# Patient Record
Sex: Female | Born: 1955
Health system: Southern US, Community
[De-identification: ages and names within clinical notes are randomized; demographics above are authoritative.]

## PROBLEM LIST (undated history)

## (undated) DIAGNOSIS — I4819 Other persistent atrial fibrillation: Secondary | ICD-10-CM

## (undated) DIAGNOSIS — E079 Disorder of thyroid, unspecified: Secondary | ICD-10-CM

## (undated) DIAGNOSIS — I872 Venous insufficiency (chronic) (peripheral): Secondary | ICD-10-CM

## (undated) DIAGNOSIS — M1990 Unspecified osteoarthritis, unspecified site: Secondary | ICD-10-CM

## (undated) DIAGNOSIS — I10 Essential (primary) hypertension: Secondary | ICD-10-CM

## (undated) DIAGNOSIS — E782 Mixed hyperlipidemia: Secondary | ICD-10-CM

## (undated) DIAGNOSIS — U071 COVID-19: Secondary | ICD-10-CM

## (undated) DIAGNOSIS — E039 Hypothyroidism, unspecified: Secondary | ICD-10-CM

## (undated) HISTORY — DX: Other persistent atrial fibrillation: I48.19

## (undated) HISTORY — DX: COVID-19: U07.1

## (undated) HISTORY — PX: CARPAL TUNNEL RELEASE: SHX101

## (undated) HISTORY — PX: HERNIA REPAIR: SHX51

## (undated) HISTORY — DX: Unspecified osteoarthritis, unspecified site: M19.90

## (undated) HISTORY — DX: Essential (primary) hypertension: I10

## (undated) HISTORY — DX: Venous insufficiency (chronic) (peripheral): I87.2

## (undated) HISTORY — DX: Mixed hyperlipidemia: E78.2

## (undated) HISTORY — DX: Hypothyroidism, unspecified: E03.9

---

## 2000-08-24 ENCOUNTER — Inpatient Hospital Stay (HOSPITAL_COMMUNITY): Admission: EM | Admit: 2000-08-24 | Discharge: 2000-08-29 | Payer: Self-pay | Admitting: Emergency Medicine

## 2000-08-24 ENCOUNTER — Encounter: Payer: Self-pay | Admitting: Emergency Medicine

## 2000-09-06 ENCOUNTER — Encounter: Payer: Self-pay | Admitting: Family Medicine

## 2000-09-06 ENCOUNTER — Ambulatory Visit (HOSPITAL_COMMUNITY): Admission: RE | Admit: 2000-09-06 | Discharge: 2000-09-06 | Payer: Self-pay | Admitting: Family Medicine

## 2003-04-16 ENCOUNTER — Emergency Department (HOSPITAL_COMMUNITY): Admission: EM | Admit: 2003-04-16 | Discharge: 2003-04-16 | Payer: Self-pay | Admitting: Emergency Medicine

## 2003-06-23 ENCOUNTER — Ambulatory Visit (HOSPITAL_COMMUNITY): Admission: RE | Admit: 2003-06-23 | Discharge: 2003-06-23 | Payer: Self-pay | Admitting: Podiatry

## 2003-12-01 ENCOUNTER — Observation Stay (HOSPITAL_COMMUNITY): Admission: RE | Admit: 2003-12-01 | Discharge: 2003-12-02 | Payer: Self-pay | Admitting: General Surgery

## 2004-01-08 ENCOUNTER — Ambulatory Visit (HOSPITAL_COMMUNITY): Admission: RE | Admit: 2004-01-08 | Discharge: 2004-01-08 | Payer: Self-pay | Admitting: Family Medicine

## 2004-04-02 ENCOUNTER — Ambulatory Visit (HOSPITAL_COMMUNITY): Admission: RE | Admit: 2004-04-02 | Discharge: 2004-04-02 | Payer: Self-pay | Admitting: Podiatry

## 2004-08-30 ENCOUNTER — Ambulatory Visit (HOSPITAL_COMMUNITY): Admission: RE | Admit: 2004-08-30 | Discharge: 2004-08-30 | Payer: Self-pay | Admitting: Family Medicine

## 2005-08-24 ENCOUNTER — Ambulatory Visit (HOSPITAL_COMMUNITY): Admission: RE | Admit: 2005-08-24 | Discharge: 2005-08-24 | Payer: Self-pay | Admitting: *Deleted

## 2005-12-30 ENCOUNTER — Ambulatory Visit (HOSPITAL_COMMUNITY): Admission: RE | Admit: 2005-12-30 | Discharge: 2005-12-30 | Payer: Self-pay | Admitting: Internal Medicine

## 2007-07-25 ENCOUNTER — Ambulatory Visit (HOSPITAL_COMMUNITY): Admission: RE | Admit: 2007-07-25 | Discharge: 2007-07-25 | Payer: Self-pay | Admitting: Family Medicine

## 2007-08-06 ENCOUNTER — Ambulatory Visit (HOSPITAL_COMMUNITY): Admission: RE | Admit: 2007-08-06 | Discharge: 2007-08-06 | Payer: Self-pay | Admitting: Family Medicine

## 2008-07-01 IMAGING — US US BREAST*L*
1 series · 8 of 8 positions shown · non-contrast
Comparison: none

LEFT DIAGNOSTIC MAMMOGRAM

LEFT BREAST ULTRASOUND
UNILATERAL LEFT DIAGNOSTIC MAMMOGRAM AND LEFT BREAST ULTRASOUND:
CLINICAL DATA: Abnormal baseline screening mammogram.

[Series 1: unknown · 0.07mm/px · 8 of 8 slices shown]
[im 1/8]
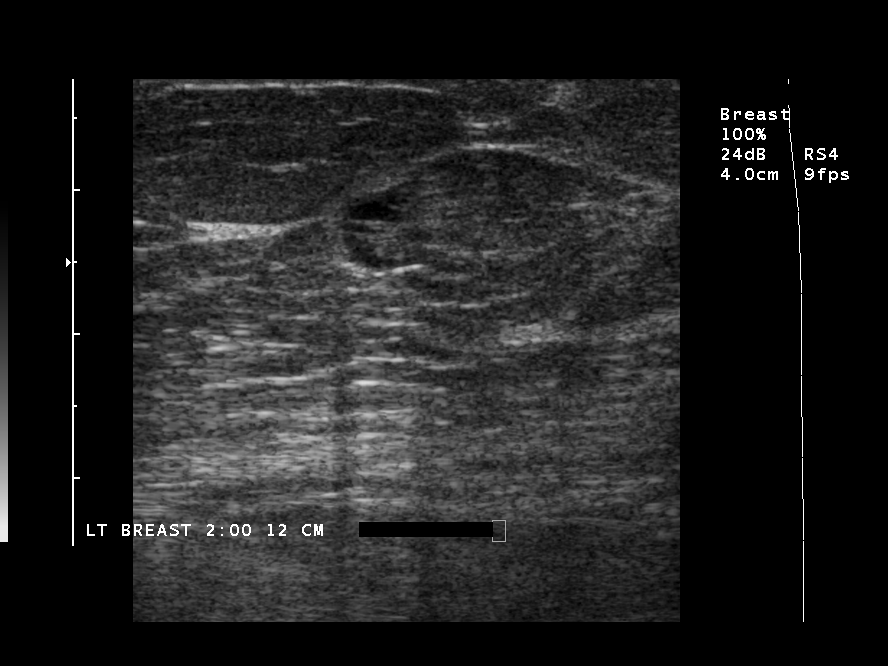
[im 2/8]
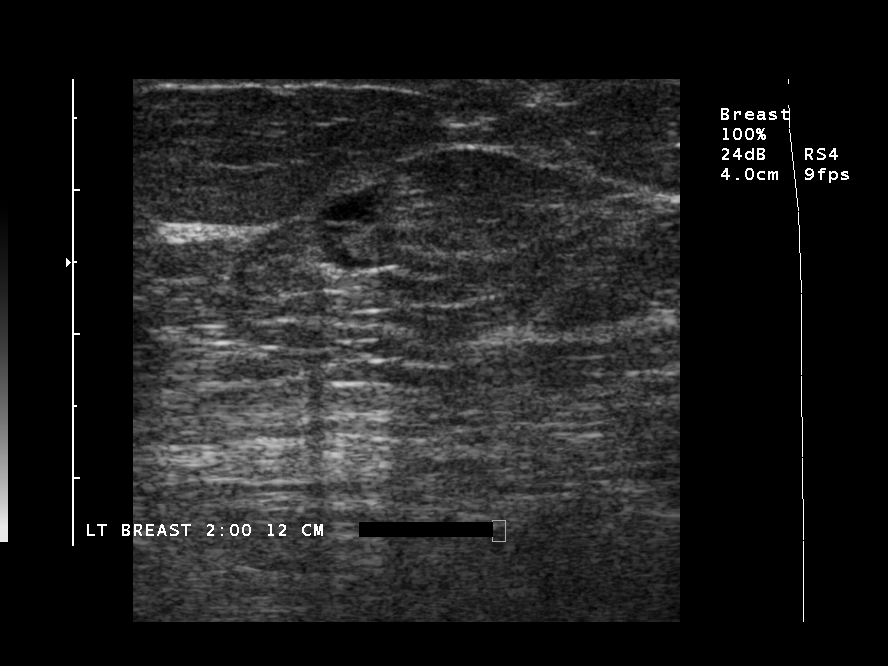
[im 3/8]
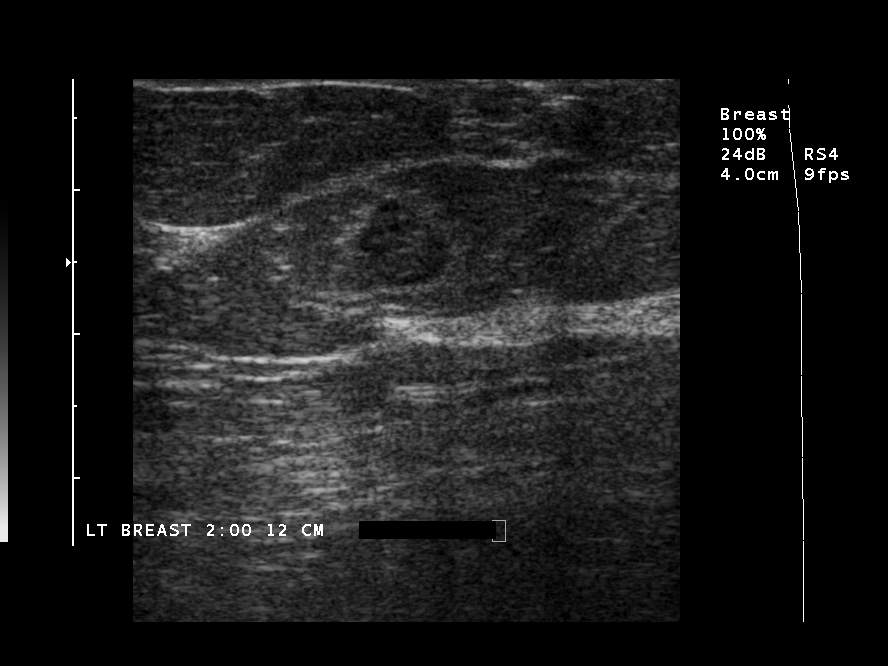
[im 4/8]
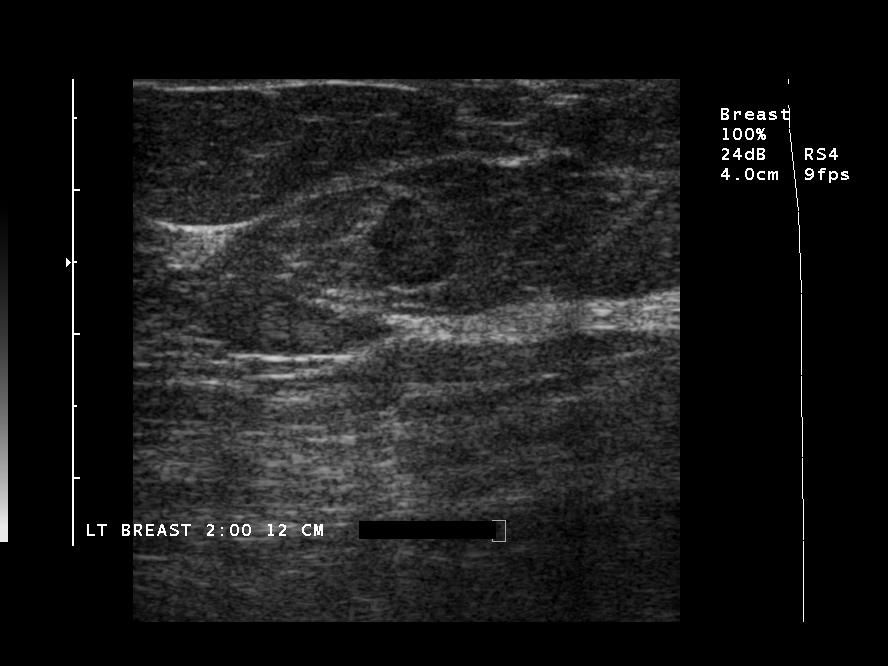
[im 5/8]
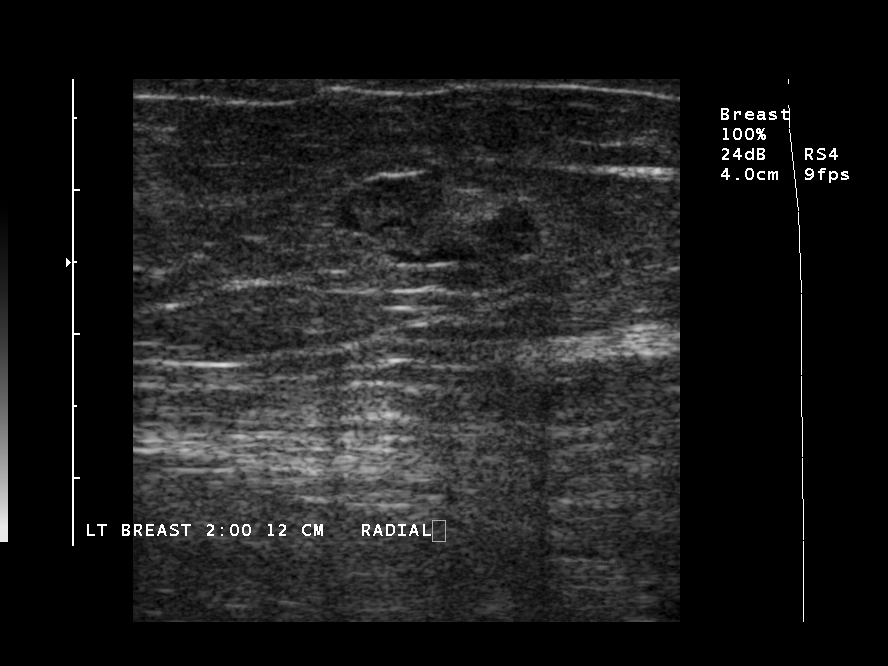
[im 6/8]
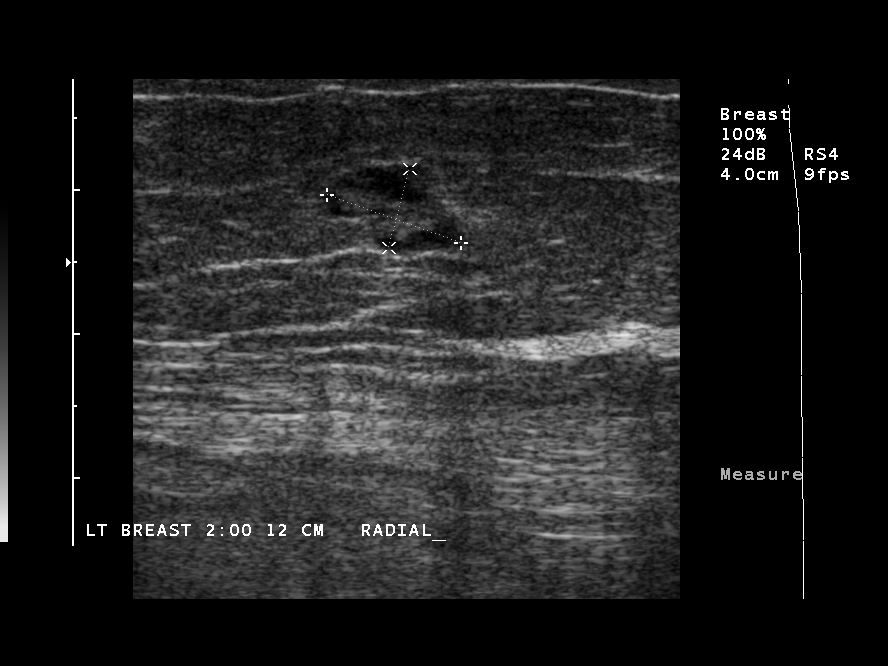
[im 7/8]
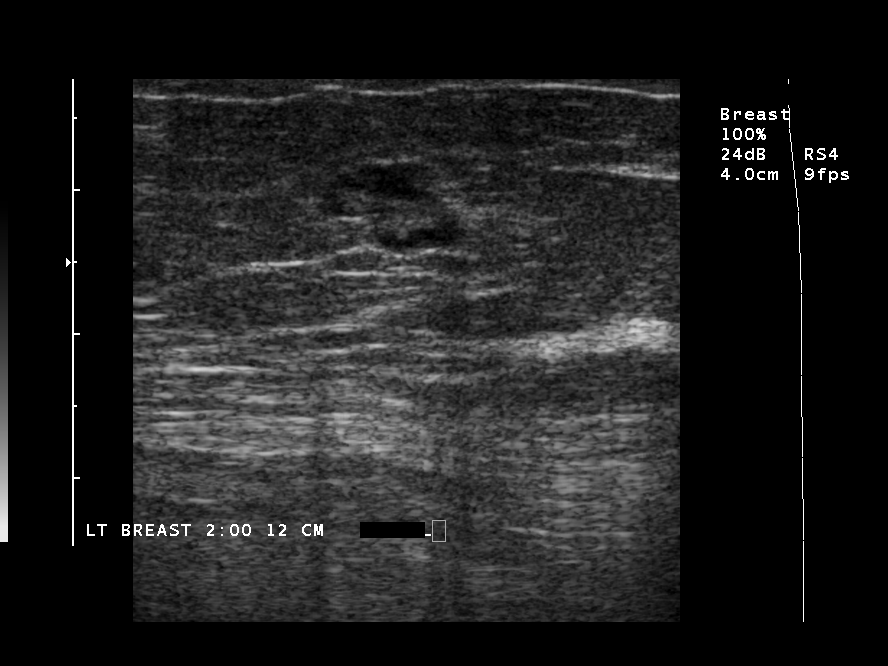
[im 8/8]
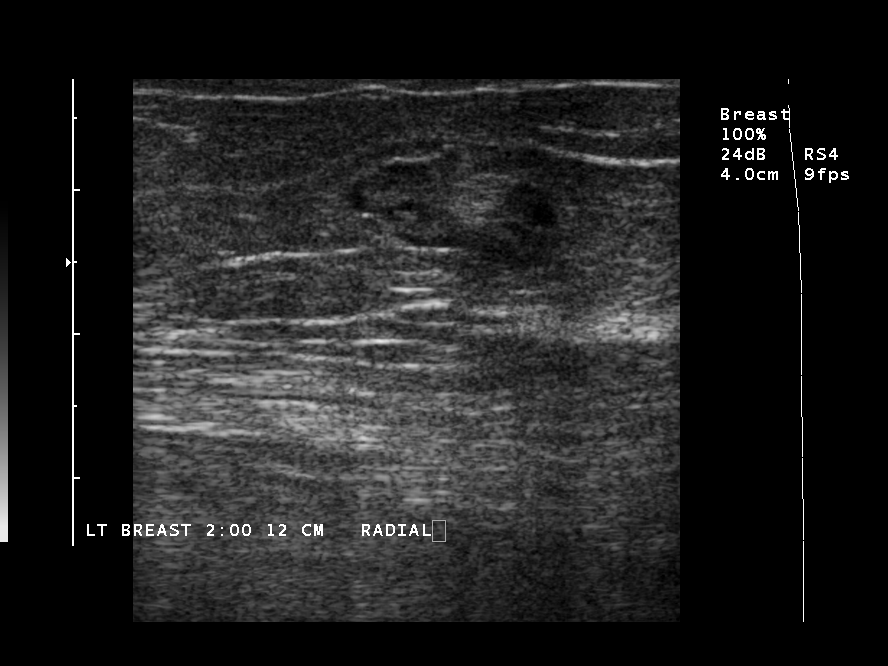

[8 of 8 positions shown; findings below may reference images not displayed]

Focally compressed left CC and left MLO views were obtained which demonstrate circumscribed nodules
adjacent to one another within the left breast at the 2 o'clock position. The appearance on 
mammography would be most consistent with intramammary lymph nodes.

On my directed physical examination of this area there is no discrete palpable abnormality.  
Ultrasound demonstrates multiple benign-appearing intramammary lymph nodes located at the 2 o'clock
position within the left breast approximately 15 cm from the nipple.  These do contain central 
fatty hilar regions with normal cortex.  There are no other lesions.
IMPRESSION: Findings are consistent with multiple benign intramammary lymph nodes adjacent one another within 
the upper outer quadrant of the left breast at the 2 o'clock position.  Recommend annual screening 
mammography.

ASSESSMENT: Benign - BI-RADS 2

Screening mammogram of both breasts in 1 year.
,

## 2010-07-23 NOTE — H&P (Signed)
NAME:  Angela [name], Angela [name]                        ACCOUNT NO.:  [id number]   MEDICAL RECORD NO.:  [id number]                   PATIENT TYPE:   LOCATION:                                       FACILITY:   PHYSICIAN:  Dalia Heading, M.D.               DATE OF BIRTH:  05-03-1955   DATE OF ADMISSION:  DATE OF DISCHARGE:                                HISTORY & PHYSICAL   CHIEF COMPLAINT:  Umbilical hernia.   HISTORY OF PRESENT ILLNESS:  The patient is a 55 year old morbidly obese  white female who is referred for evaluation and treatment of an umbilical  hernia. She has had increasing pain and swelling in the umbilical region  recently. No nausea or vomiting have been noted.   PAST MEDICAL HISTORY:  1.  Diverticulitis.  2.  Morbid obesity.   PAST SURGICAL HISTORY:  Cesarean section.   CURRENT MEDICATIONS:  Fluconazole, hydrocodone p.r.n. pain.   ALLERGIES:  No known drug allergies.   REVIEW OF SYSTEMS:  Noncontributory.   PHYSICAL EXAMINATION:  GENERAL:  The patient is a morbidly obese white  female in no acute distress. She weighs 420 pounds.  VITAL SIGNS:  Stable.  LUNGS:  Clear to auscultation with equal breath sounds bilaterally.  HEART:  Examination reveals regular rate and rhythm without S3, S4, or  murmurs.  ABDOMEN:  Soft with tenderness down the umbilical region with a large hernia  that is present and difficult to reduce. It is greater than 6 cm in size. No  hepatosplenomegaly or masses are noted.   IMPRESSION:  Umbilical hernia.   PLAN:  The patient is scheduled for an umbilical herniorrhaphy with mesh on  December 01, 2003. The risks and benefits of the procedure including  bleeding, infection, cardiopulmonary difficulties, and the possibility of  the recurrence of the hernia were fully explained to the patient who gave  informed consent.     ___________________________________________                                         Dalia Heading, M.D.   MAJ/MEDQ   D:  11/20/2003  T:  11/20/2003  Job:  [id number]   cc:   Corrie Mckusick, M.D.  [address] [place]ure[place] Josephs. [place]  [place]  [place] [address]  Fax: [phone]

## 2010-07-23 NOTE — Procedures (Signed)
Angela [name], Angela [name] NO.:  [id number]   MEDICAL RECORD NO.:  [id number]          PATIENT TYPE:  OUT   LOCATION:  RAD                           FACILITY:  [facility]   PHYSICIAN:  Vida Roller, M.D.   DATE OF BIRTH:  1955-11-05   DATE OF PROCEDURE:  08/30/2004  DATE OF DISCHARGE:                                  ECHOCARDIOGRAM   PROCEDURE:  Echocardiogram - Tape #[id number].  Tape count 800 through 1317.   CARDIOLOGIST:  Vida Roller, M.D.   INDICATIONS FOR PROCEDURE:  This is a 55 year old woman with lower extremity  edema.  The technical quality of the study is extremely difficult, with only  very limited views, some of which are non-standard.   RESULTS:  The M-mode tracings  Aorta:  36 mm.  The left atrium:  43 mm.  Septum:  14 mm.  Posterior wall:  13 mm.  Left ventricular diastolic dimension:  51 mm.  Left ventricular systolic dimension:  39 mm.   2-D AND DOPPLER IMAGING:  The left ventricle appears to be normal size, with  mild concentric left ventricular hypertrophy.  The estimated ejection  fraction is somewhere between 50%-60%.  The endocardial definition is quite  limited.  There does, however, not appear to be significant wall motion  abnormality seen.   The RV was not well seen.   The atria appear to be top normal in size and may be enlarged.   There is no significant valvular heart disease by Doppler.   There is a small pericardial effusion, which does not appear to be  hemodynamically significant.       JH/MEDQ  D:  08/30/2004  T:  08/30/2004  Job:  [id number]   cc:   Corrie Mckusick, M.D.  Fax: [phone]

## 2010-07-23 NOTE — Op Note (Signed)
Angela [name], Angela [name]              ACCOUNT NO.:  [id number]   MEDICAL RECORD NO.:  [id number]          PATIENT TYPE:  AMB   LOCATION:  SDS                          FACILITY:  [facility]   PHYSICIAN:  Lowell Bouton, M.D.DATE OF BIRTH:  1955-11-05   DATE OF PROCEDURE:  08/24/2005  DATE OF DISCHARGE:                                 OPERATIVE REPORT   PREOP DIAGNOSIS:  Right carpal tunnel syndrome.   POSTOP DIAGNOSIS:  Right carpal tunnel syndrome.   PROCEDURE:  Decompression median nerve right carpal tunnel.   SURGEON:  Lowell Bouton, M.D.   ANESTHESIA:  1/2% Marcaine local with sedation.   OPERATIVE FINDINGS:  The patient had no masses in the carpal canal.  The  motor branch of the nerve was intact.   DESCRIPTION OF PROCEDURE:  Under 1/2% Marcaine local anesthesia, with a  tourniquet on the right arm; the right hand was prepped and draped in the  usual fashion, and after exsanguinating the limb the tourniquet was inflated  to 275 mmHg. Unfortunately due to the size of her arm, the tourniquet was  not of any benefit so it was released.  A 3-cm longitudinal incision was  then made in the palm, just ulnar to the thenar crease and carried through  the subcutaneous tissues.  Bleeding points were coagulated.  Blunt  dissection was carried through the superficial palmar fascia, distal to the  transverse carpal ligament.  A hemostat was then placed in the carpal canal,  up against the hook of the hamate and the transverse carpal ligament was  divided on the ulnar border of the median nerve.  The proximal end of the  ligament was divided with the scissors, after dissecting the nerve away from  the undersurface of the ligament.  The carpal canal was then palpated and  was found to be adequately decompressed.  The nerve was examined and the  motor branch was identified.  The wound was then irrigated with saline.  The  skin was closed with 4-0 nylon suture.  Sterile dressings  were applied,  followed by volar wrist splint.  The patient tolerated the procedure well,  and went to the recovery room, awake and stable in good condition.      Lowell Bouton, M.D.  Electronically Signed     EMM/MEDQ  D:  08/24/2005  T:  08/24/2005  Job:  [id number]   cc:   Corrie Mckusick, M.D.  Fax: [phone]

## 2010-07-23 NOTE — Op Note (Signed)
[name], Angela              ACCOUNT NO.:  [id number]   MEDICAL RECORD NO.:  [id number]          PATIENT TYPE:  AMB   LOCATION:  DAY                           FACILITY:  [facility]   PHYSICIAN:  Dalia Heading, M.D.  DATE OF BIRTH:  April 25, 1955   DATE OF PROCEDURE:  12/01/2003  DATE OF DISCHARGE:                                 OPERATIVE REPORT   PREOPERATIVE DIAGNOSIS:  Ventral hernia.   POSTOPERATIVE DIAGNOSIS:  Incarcerated ventral hernia.   PROCEDURE:  Incarcerated ventral herniorrhaphy with mesh, partial  omentectomy.   SURGEON:  Dalia Heading, M.D.   ANESTHESIA:  General endotracheal.   INDICATIONS:  The patient is a 55 year old morbidly obese white female who  presents with worsening swelling in the umbilical region. On examination.  She does have a ventral hernia. The contents could not be full reduced,  though the patient states that her symptoms of abdominal pain and swelling  seem to be worsening. The risks and benefits of the procedure including  bleeding, infection, cardiopulmonary difficulties, and the possibility of  recurrence of the hernia were fully explained to the patient who gave  informed consent.   PROCEDURE NOTE:  The patient was placed in the supine position. After  induction of general endotracheal, the abdomen was prepped and draped using  the usual sterile technique with Betadine. Surgical site confirmation was  performed.   A supraumbilical transverse incision was made. A large hernia sac was found,  though the actual defect in the base of the fascia was only 4 cm in size.  The hernia sac was entered into, and transverse colon was noted to be within  the hernia sac. A partial omentectomy was performed using LDS stapler to  facilitate reduction of the hernia. The transverse colon was then returned  into the abdominal cavity without difficulty after the omentectomy was  performed. The fascia was then closed transversely using a looped 0 Novofil  running suture. An [place] polypropylene mesh patch was then placed over the  fascia and secured with 0 Novofil interrupted sutures. The base of the  umbilicus was secured back to the fascia and mesh using the 2-0 Vicryl  interrupted suture. The subcutaneous layer was reapproximated using a 2-0  Vicryl interrupted suture. The skin was closed using staples. Sensorcaine  0.5% was instilled into the surrounding wound. Betadine ointment and dry  sterile dressing were applied.   All tape and needle counts were correct at the end of the procedure. The  patient was extubated in the operating room and went back to recovery room  awake in stable condition.   COMPLICATIONS:  None.   SPECIMENS:  Omentum, ventral hernia sac.   BLOOD LOSS:  Less than 100 cc.      MAJ/MEDQ  D:  12/01/2003  T:  12/01/2003  Job:  [id number]   cc:   Corrie Mckusick, M.D.  [address] [place]s. [place]  [place]  [place] [address]  Fax: [phone]

## 2011-01-18 ENCOUNTER — Ambulatory Visit: Payer: Self-pay | Admitting: Orthopedic Surgery

## 2011-03-10 ENCOUNTER — Ambulatory Visit: Payer: Self-pay | Admitting: Orthopedic Surgery

## 2012-10-12 ENCOUNTER — Other Ambulatory Visit (HOSPITAL_COMMUNITY): Payer: Self-pay | Admitting: Family Medicine

## 2012-10-12 DIAGNOSIS — Z139 Encounter for screening, unspecified: Secondary | ICD-10-CM

## 2012-10-16 ENCOUNTER — Inpatient Hospital Stay (HOSPITAL_COMMUNITY): Admission: RE | Admit: 2012-10-16 | Payer: Self-pay | Source: Ambulatory Visit

## 2013-03-11 ENCOUNTER — Ambulatory Visit (HOSPITAL_COMMUNITY): Payer: Self-pay

## 2013-07-05 ENCOUNTER — Emergency Department (HOSPITAL_COMMUNITY)
Admission: EM | Admit: 2013-07-05 | Discharge: 2013-07-05 | Disposition: A | Discharge status: Home / Self Care | Payer: Medicare HMO | Attending: Emergency Medicine | Admitting: Emergency Medicine

## 2013-07-05 ENCOUNTER — Encounter (HOSPITAL_COMMUNITY): Payer: Self-pay | Admitting: Emergency Medicine

## 2013-07-05 DIAGNOSIS — X58XXXA Exposure to other specified factors, initial encounter: Secondary | ICD-10-CM | POA: Insufficient documentation

## 2013-07-05 DIAGNOSIS — T169XXA Foreign body in ear, unspecified ear, initial encounter: Secondary | ICD-10-CM | POA: Insufficient documentation

## 2013-07-05 DIAGNOSIS — Y929 Unspecified place or not applicable: Secondary | ICD-10-CM | POA: Insufficient documentation

## 2013-07-05 DIAGNOSIS — T162XXA Foreign body in left ear, initial encounter: Secondary | ICD-10-CM

## 2013-07-05 DIAGNOSIS — E079 Disorder of thyroid, unspecified: Secondary | ICD-10-CM | POA: Insufficient documentation

## 2013-07-05 DIAGNOSIS — Y9389 Activity, other specified: Secondary | ICD-10-CM | POA: Insufficient documentation

## 2013-07-05 DIAGNOSIS — E669 Obesity, unspecified: Secondary | ICD-10-CM | POA: Insufficient documentation

## 2013-07-05 DIAGNOSIS — Z79899 Other long term (current) drug therapy: Secondary | ICD-10-CM | POA: Insufficient documentation

## 2013-07-05 HISTORY — DX: Disorder of thyroid, unspecified: E07.9

## 2013-07-05 MED ORDER — NEOMYCIN-POLYMYXIN-HC 1 % OT SOLN
3.0000 [drp] | Freq: Once | OTIC | Status: AC
  Administered 2013-07-05: 3 [drp] via OTIC
  Filled 2013-07-05: qty 10

## 2013-07-05 NOTE — Discharge Instructions (Signed)
Please do not use Q-tips in ears. Please apply 3 Cortisporin otic drops to the left ear 3 times daily for the next 5 days. Please see your primary doctor, or return to the emergency department if any signs of infection. Ear Foreign Body An ear foreign body is an object that is stuck in the ear. It is common for young children to put objects into the ear canal. These may include pebbles, beads, beans, and any other small objects which will fit. In adults, objects such as cotton swabs may become lodged in the ear canal. In all ages, the most common foreign bodies are insects that enter the ear canal.  SYMPTOMS  Foreign bodies may cause pain, buzzing or roaring sounds, hearing loss, and ear drainage.  HOME CARE INSTRUCTIONS   Keep all follow-up appointments with your caregiver as told.  Keep small objects out of reach of young children. Tell them not to put anything in their ears. SEEK IMMEDIATE MEDICAL CARE IF:   You have bleeding from the ear.  You have increased pain or swelling of the ear.  You have reduced hearing.  You have discharge coming from the ear.  You have a fever.  You have a headache. MAKE SURE YOU:   Understand these instructions.  Will watch your condition.  Will get help right away if you are not doing well or get worse. Document Released: 02/19/2000 Document Revised: 05/16/2011 Document Reviewed: 10/10/2007 [facility] Patient Information 2014 [employer].

## 2013-07-05 NOTE — ED Notes (Signed)
Pt states cotton of q tip fell off into left ear today. Denies pain.

## 2013-07-05 NOTE — ED Provider Notes (Signed)
CSN: [id number]     Arrival date & time 07/05/13  1216 History   First MD Initiated Contact with Patient 07/05/13 1243     Chief Complaint  Patient presents with  . Otalgia     (Consider location/radiation/quality/duration/timing/severity/associated sxs/prior Treatment) Patient is a 58 y.o. female presenting with ear pain. The history is provided by the patient.  Otalgia Location:  Left Behind ear:  No abnormality Quality:  Aching and pressure Severity:  Mild Onset quality:  Sudden Duration:  3 hours Timing:  Constant Progression:  Unchanged Context comment:  Pt got a q-tip stuck in the left ear. Relieved by:  Nothing Worsened by:  Nothing tried Ineffective treatments:  None tried Associated symptoms: no abdominal pain, no cough, no fever, no headaches, no hearing loss and no neck pain   Risk factors: no recent travel     Past Medical History  Diagnosis Date  . Thyroid disease    Past Surgical History  Procedure Laterality Date  . Cesarean section    . Hernia repair     History reviewed. No pertinent family history. History  Substance Use Topics  . Smoking status: Never Smoker   . Smokeless tobacco: Not on file  . Alcohol Use: No   OB History   Grav Para Term Preterm Abortions TAB SAB Ect Mult Living                 Review of Systems  Constitutional: Negative for fever and activity change.       All ROS Neg except as noted in HPI  HENT: Positive for ear pain. Negative for hearing loss and nosebleeds.   Eyes: Negative for photophobia and discharge.  Respiratory: Negative for cough, shortness of breath and wheezing.   Cardiovascular: Negative for chest pain and palpitations.  Gastrointestinal: Negative for abdominal pain and blood in stool.  Genitourinary: Negative for dysuria, frequency and hematuria.  Musculoskeletal: Negative for arthralgias, back pain and neck pain.  Skin: Negative.   Neurological: Negative for dizziness, seizures, speech difficulty and  headaches.  Psychiatric/Behavioral: Negative for hallucinations and confusion.      Allergies  Review of patient's allergies indicates no known allergies.  Home Medications   Prior to Admission medications   Medication Sig Start Date End Date Taking? Authorizing Provider  acetaminophen (TYLENOL) 500 MG tablet Take 1,000 mg by mouth every 6 (six) hours as needed for headache.   Yes Historical Provider, MD  levothyroxine (SYNTHROID, LEVOTHROID) 75 MCG tablet Take 1 tablet by mouth daily. 06/11/13  Yes Historical Provider, MD   BP 185/90  Pulse 88  Temp(Src) 97.5 F (36.4 C) (Oral)  Resp 18  SpO2 95% Physical Exam  Nursing note and vitals reviewed. Constitutional: She is oriented to person, place, and time. She appears well-developed and well-nourished.  Non-toxic appearance.  Obese   HENT:  Head: Normocephalic.  Right Ear: Tympanic membrane and external ear normal. No foreign bodies. No mastoid tenderness.  Left Ear: Tympanic membrane and external ear normal. A foreign body is present. No mastoid tenderness.  Mouth/Throat: Uvula is midline, oropharynx is clear and moist and mucous membranes are normal.  FB noted in the left EAC. Right EAC clear. TM wnl.  Eyes: EOM and lids are normal. Pupils are equal, round, and reactive to light.  Neck: Normal range of motion. Neck supple. Carotid bruit is not present.  Cardiovascular: Normal rate, regular rhythm, normal heart sounds, intact distal pulses and normal pulses.   Pulmonary/Chest: Breath sounds normal. No  respiratory distress.  Abdominal: Soft. Bowel sounds are normal. There is no tenderness. There is no guarding.  Musculoskeletal: Normal range of motion.  Lymphadenopathy:       Head (right side): No submandibular adenopathy present.       Head (left side): No submandibular adenopathy present.    She has no cervical adenopathy.  Neurological: She is alert and oriented to person, place, and time. She has normal strength. No cranial  nerve deficit or sensory deficit.  Skin: Skin is warm and dry.  Psychiatric: She has a normal mood and affect. Her speech is normal.    ED Course  FOREIGN BODY REMOVAL Date/Time: 07/05/2013 1:01 PM Performed by: Lenox Ahr Authorized by: Lenox Ahr Consent: Verbal consent obtained. Risks and benefits: risks, benefits and alternatives were discussed Consent given by: patient Patient understanding: patient states understanding of the procedure being performed Patient identity confirmed: arm band Time out: Immediately prior to procedure a "time out" was called to verify the correct patient, procedure, equipment, support staff and site/side marked as required. Body area: ear Location details: left ear Patient sedated: no Patient restrained: no Patient cooperative: yes Localization method: ENT speculum Removal mechanism: alligator forceps Complexity: simple 1 objects recovered. Objects recovered: Q-Tip Post-procedure assessment: foreign body removed Patient tolerance: Patient tolerated the procedure well with no immediate complications.   (including critical care time) Labs Review Labs Reviewed - No data to display  Imaging Review No results found.   EKG Interpretation None      MDM Patient was attempting to clean her ears with a Q-tip, when she had her Q-tip stuck in the left year. Q-tip removed without problem .mild scratchy areas noted after the Q-tip was removed. Patient treated with Cortisporin otic drops. Patient will return if any changes or problems.    Final diagnoses:  None    **I have reviewed nursing notes, vital signs, and all appropriate lab and imaging results for this patient.Lenox Ahr, PA-C 07/06/13 [phone]

## 2013-07-05 NOTE — ED Notes (Signed)
Pt has been using Q tip in lt ear, FB sensation,  Seen and eval by Sima Matas PA

## 2013-07-08 NOTE — ED Provider Notes (Signed)
Medical screening examination/treatment/procedure(s) were performed by non-physician practitioner and as supervising physician I was immediately available for consultation/collaboration.   EKG Interpretation None        Maudry Diego, MD 07/08/13 [phone]

## 2013-10-30 DIAGNOSIS — K439 Ventral hernia without obstruction or gangrene: Secondary | ICD-10-CM | POA: Insufficient documentation

## 2013-10-30 DIAGNOSIS — E65 Localized adiposity: Secondary | ICD-10-CM | POA: Insufficient documentation

## 2014-05-08 DIAGNOSIS — E782 Mixed hyperlipidemia: Secondary | ICD-10-CM | POA: Diagnosis not present

## 2014-05-08 DIAGNOSIS — E063 Autoimmune thyroiditis: Secondary | ICD-10-CM | POA: Diagnosis not present

## 2014-05-08 DIAGNOSIS — Z6841 Body Mass Index (BMI) 40.0 and over, adult: Secondary | ICD-10-CM | POA: Diagnosis not present

## 2014-05-08 DIAGNOSIS — D492 Neoplasm of unspecified behavior of bone, soft tissue, and skin: Secondary | ICD-10-CM | POA: Diagnosis not present

## 2014-07-08 DIAGNOSIS — Z6841 Body Mass Index (BMI) 40.0 and over, adult: Secondary | ICD-10-CM | POA: Diagnosis not present

## 2014-07-08 DIAGNOSIS — L989 Disorder of the skin and subcutaneous tissue, unspecified: Secondary | ICD-10-CM | POA: Diagnosis not present

## 2014-10-13 DIAGNOSIS — Z1389 Encounter for screening for other disorder: Secondary | ICD-10-CM | POA: Diagnosis not present

## 2014-10-13 DIAGNOSIS — Z6841 Body Mass Index (BMI) 40.0 and over, adult: Secondary | ICD-10-CM | POA: Diagnosis not present

## 2014-10-13 DIAGNOSIS — Z0000 Encounter for general adult medical examination without abnormal findings: Secondary | ICD-10-CM | POA: Diagnosis not present

## 2014-11-13 DIAGNOSIS — C44519 Basal cell carcinoma of skin of other part of trunk: Secondary | ICD-10-CM | POA: Diagnosis not present

## 2014-12-11 DIAGNOSIS — Z85828 Personal history of other malignant neoplasm of skin: Secondary | ICD-10-CM | POA: Diagnosis not present

## 2014-12-11 DIAGNOSIS — Z08 Encounter for follow-up examination after completed treatment for malignant neoplasm: Secondary | ICD-10-CM | POA: Diagnosis not present

## 2014-12-11 DIAGNOSIS — L929 Granulomatous disorder of the skin and subcutaneous tissue, unspecified: Secondary | ICD-10-CM | POA: Diagnosis not present

## 2015-04-27 DIAGNOSIS — Z0000 Encounter for general adult medical examination without abnormal findings: Secondary | ICD-10-CM | POA: Diagnosis not present

## 2015-04-27 DIAGNOSIS — Z1389 Encounter for screening for other disorder: Secondary | ICD-10-CM | POA: Diagnosis not present

## 2015-04-27 DIAGNOSIS — Z6841 Body Mass Index (BMI) 40.0 and over, adult: Secondary | ICD-10-CM | POA: Diagnosis not present

## 2015-04-27 DIAGNOSIS — E782 Mixed hyperlipidemia: Secondary | ICD-10-CM | POA: Diagnosis not present

## 2015-05-15 ENCOUNTER — Other Ambulatory Visit (HOSPITAL_COMMUNITY): Payer: Self-pay | Admitting: Family Medicine

## 2015-05-15 DIAGNOSIS — Z1231 Encounter for screening mammogram for malignant neoplasm of breast: Secondary | ICD-10-CM

## 2015-05-20 ENCOUNTER — Ambulatory Visit (HOSPITAL_COMMUNITY): Payer: Medicare HMO

## 2015-06-04 ENCOUNTER — Ambulatory Visit (HOSPITAL_COMMUNITY)
Admission: RE | Admit: 2015-06-04 | Discharge: 2015-06-04 | Disposition: A | Discharge status: Home / Self Care | Payer: Medicare HMO | Source: Ambulatory Visit | Attending: Family Medicine | Admitting: Family Medicine

## 2015-06-04 DIAGNOSIS — Z1231 Encounter for screening mammogram for malignant neoplasm of breast: Secondary | ICD-10-CM | POA: Insufficient documentation

## 2016-05-02 DIAGNOSIS — I4891 Unspecified atrial fibrillation: Secondary | ICD-10-CM | POA: Diagnosis not present

## 2016-05-02 DIAGNOSIS — G4730 Sleep apnea, unspecified: Secondary | ICD-10-CM | POA: Diagnosis not present

## 2016-05-02 DIAGNOSIS — Z23 Encounter for immunization: Secondary | ICD-10-CM | POA: Diagnosis not present

## 2016-05-02 DIAGNOSIS — Z0000 Encounter for general adult medical examination without abnormal findings: Secondary | ICD-10-CM | POA: Diagnosis not present

## 2016-05-02 DIAGNOSIS — E039 Hypothyroidism, unspecified: Secondary | ICD-10-CM | POA: Diagnosis not present

## 2016-05-02 DIAGNOSIS — R011 Cardiac murmur, unspecified: Secondary | ICD-10-CM | POA: Diagnosis not present

## 2016-05-02 DIAGNOSIS — E785 Hyperlipidemia, unspecified: Secondary | ICD-10-CM | POA: Diagnosis not present

## 2016-05-02 DIAGNOSIS — Z1389 Encounter for screening for other disorder: Secondary | ICD-10-CM | POA: Diagnosis not present

## 2016-05-02 DIAGNOSIS — Z6841 Body Mass Index (BMI) 40.0 and over, adult: Secondary | ICD-10-CM | POA: Diagnosis not present

## 2016-05-02 DIAGNOSIS — E063 Autoimmune thyroiditis: Secondary | ICD-10-CM | POA: Diagnosis not present

## 2016-06-06 DIAGNOSIS — E782 Mixed hyperlipidemia: Secondary | ICD-10-CM

## 2016-06-06 DIAGNOSIS — I4891 Unspecified atrial fibrillation: Secondary | ICD-10-CM | POA: Insufficient documentation

## 2016-06-06 DIAGNOSIS — E039 Hypothyroidism, unspecified: Secondary | ICD-10-CM | POA: Insufficient documentation

## 2016-06-06 DIAGNOSIS — E063 Autoimmune thyroiditis: Secondary | ICD-10-CM

## 2016-06-06 DIAGNOSIS — E785 Hyperlipidemia, unspecified: Secondary | ICD-10-CM | POA: Insufficient documentation

## 2016-06-06 DIAGNOSIS — R011 Cardiac murmur, unspecified: Secondary | ICD-10-CM | POA: Insufficient documentation

## 2016-06-06 DIAGNOSIS — G4730 Sleep apnea, unspecified: Secondary | ICD-10-CM | POA: Insufficient documentation

## 2016-06-08 ENCOUNTER — Encounter: Payer: Self-pay | Admitting: Cardiovascular Disease

## 2016-06-08 ENCOUNTER — Ambulatory Visit (INDEPENDENT_AMBULATORY_CARE_PROVIDER_SITE_OTHER): Payer: Medicare HMO | Admitting: Cardiovascular Disease

## 2016-06-08 VITALS — BP 146/66 | HR 72 | Ht 71.0 in | Wt >= 6400 oz

## 2016-06-08 DIAGNOSIS — R600 Localized edema: Secondary | ICD-10-CM

## 2016-06-08 DIAGNOSIS — R030 Elevated blood-pressure reading, without diagnosis of hypertension: Secondary | ICD-10-CM | POA: Diagnosis not present

## 2016-06-08 DIAGNOSIS — R011 Cardiac murmur, unspecified: Secondary | ICD-10-CM | POA: Diagnosis not present

## 2016-06-08 DIAGNOSIS — E063 Autoimmune thyroiditis: Secondary | ICD-10-CM | POA: Diagnosis not present

## 2016-06-08 DIAGNOSIS — Z7189 Other specified counseling: Secondary | ICD-10-CM | POA: Diagnosis not present

## 2016-06-08 DIAGNOSIS — I4891 Unspecified atrial fibrillation: Secondary | ICD-10-CM

## 2016-06-08 NOTE — Patient Instructions (Signed)
Medication Instructions:  STOP XARELTO   Labwork: NONE  Testing/Procedures: Your physician has requested that you have an echocardiogram. Echocardiography is a painless test that uses sound waves to create images of your heart. It provides your doctor with information about the size and shape of your heart and how well your heart's chambers and valves are working. This procedure takes approximately one hour. There are no restrictions for this procedure.   Follow-Up: Your physician recommends that you schedule a follow-up appointment in: 3 MONTHS    Any Other Special Instructions Will Be Listed Below (If Applicable).     If you need a refill on your cardiac medications before your next appointment, please call your pharmacy.

## 2016-06-08 NOTE — Progress Notes (Signed)
CARDIOLOGY CONSULT NOTE  Patient ID: Angela [name] MRN: [id number] DOB/AGE: 1955/08/26 61 y.o.  Admit date: (Not on file) Primary Physician: Purvis Kilts, MD Referring Physician: Hilma Favors  Reason for Consultation: atrial fibrillation, murmur  HPI: Angela [name] is a 61 y.o. female who is being seen today for the evaluation of atrial fibrillation and a murmur at the request of Sharilyn Sites, MD.  I personally reviewed office records. When she was recently evaluated by her PCP, she reportedly had an irregular rhythm and a murmur.  An ECG was reportedly performed at her PCPs office which I will order for personal review and interpretation.  She said "my heart felt funny and there were flutters ". This had been occurring intermittently for a month but became more frequent towards the end of February. She denies associated chest pain. She has chronic exertional dyspnea which she feels may have gotten slightly worse. She denies associated lightheadedness, dizziness, and syncope.  She was started on Xarelto about a month ago but says this is too expensive.  She has been having lower extremity edema which has gotten worse lately. Lasix 40 mg daily as needed has been ordered and she has been taking it daily for the past 2 months without significant relief.  Her blood pressure is elevated at 146/66 today in our office, but she says it is usually normal.  Labs 05/02/16: Hemoglobin 13.4, platelets 261, BUN 11, creatinine 1.1, total cholesterol 222, triglycerides 141, HDL 35, LDL 159.  ECG performed in the office today which I ordered and personally interpreted demonstrates normal sinus rhythm with a right bundle branch block.    No Known Allergies  Current Outpatient Prescriptions  Medication Sig Dispense Refill  . acetaminophen (TYLENOL) 500 MG tablet Take 1,000 mg by mouth every 6 (six) hours as needed for headache.    . furosemide (LASIX) 40 MG tablet Take 40 mg by  mouth daily as needed.    Marland Kitchen levothyroxine (SYNTHROID, LEVOTHROID) 100 MCG tablet Take 100 mcg by mouth daily before breakfast.    . rivaroxaban (XARELTO) 20 MG TABS tablet Take 20 mg by mouth daily with supper.     No current facility-administered medications for this visit.     Past Medical History:  Diagnosis Date  . Arthritis   . Thyroid disease     Past Surgical History:  Procedure Laterality Date  . CARPAL TUNNEL RELEASE     right hand  . CESAREAN SECTION    . HERNIA REPAIR      Social History   Social History  . Marital status: Married    Spouse name: N/A  . Number of children: N/A  . Years of education: N/A   Occupational History  . Not on file.   Social History Main Topics  . Smoking status: Never Smoker  . Smokeless tobacco: Never Used  . Alcohol use No  . Drug use: No  . Sexual activity: Not on file   Other Topics Concern  . Not on file   Social History Narrative  . No narrative on file     No family history of premature CAD in 1st degree relatives.  Prior to Admission medications   Medication Sig Start Date End Date Taking? Authorizing Provider  acetaminophen (TYLENOL) 500 MG tablet Take 1,000 mg by mouth every 6 (six) hours as needed for headache.   Yes Historical Provider, MD  furosemide (LASIX) 40 MG tablet Take 40 mg by mouth daily  as needed.   Yes Historical Provider, MD  levothyroxine (SYNTHROID, LEVOTHROID) 100 MCG tablet Take 100 mcg by mouth daily before breakfast.   Yes Historical Provider, MD  rivaroxaban (XARELTO) 20 MG TABS tablet Take 20 mg by mouth daily with supper.   Yes Historical Provider, MD     Review of systems complete and found to be negative unless listed above in HPI     Physical exam Blood pressure (!) 146/66, pulse 72, height 5\' 11"  (1.803 m), weight (!) 414 lb (187.8 kg), SpO2 97 %. General: NAD Neck: No JVD, no thyromegaly or thyroid nodule.  Lungs: Clear to auscultation bilaterally with normal respiratory  effort. CV: Nondisplaced PMI. Regular rate and rhythm, normal S1/S2, no S3/S4, no murmur.  1+ pitting bilateral lower extremity edema.  No carotid bruit.    Abdomen: Obese.  Skin: Pretibial erythema b/l.  Neurologic: Alert and oriented x 3.  Psych: Normal affect. Extremities: No clubbing or cyanosis.  HEENT: Normal.   ECG: Most recent ECG reviewed.  Telemetry: Independently reviewed.  Labs:  No results found for: WBC, HGB, HCT, MCV, PLT No results for input(s): NA, K, CL, CO2, BUN, CREATININE, CALCIUM, PROT, BILITOT, ALKPHOS, ALT, AST, GLUCOSE in the last 168 hours.  Invalid input(s): LABALBU No results found for: CKTOTAL, CKMB, CKMBINDEX, TROPONINI No results found for: CHOL No results found for: HDL No results found for: LDLCALC No results found for: TRIG No results found for: CHOLHDL No results found for: LDLDIRECT       Studies: No results found.  ASSESSMENT AND PLAN:  1. Arrhythmia and anticoagulation: I will order ECG from PCPs office for personal review and interpretation. If she indeed has atrial fibrillation, CHADSVASC score is only 1 (gender), thus anticoagulation would not be indicated. If blood pressure remains persistently elevated and she indeed has hypertension, then I would initiate anticoagulation. For the time being, I will stop Xarelto.  2. Elevated BP: She says systolic blood pressure is normally in the 120 range. I will continue to monitor this.  3. Murmur: While I do not appreciate a significant murmur, PCP heard this. I will order a 2-D echocardiogram with Doppler to evaluate cardiac structure, function, and regional wall motion.  4. Lower extremity edema: Currently takes Lasix 40 mg daily with more recent increase in leg swelling and no significant relief with current dose of diuretics. I will order an echocardiogram to evaluate cardiac structure and function to see if this is contributing in any way.  5. Morbid obesity: She needs significant weight  loss.  6. Hypothyroidism: Synthroid dose recently increased. This may be contributing to arrhythmia.   Dispo: fu 3 months.   Signed: Kate Sable, M.D., F.A.C.C.  06/08/2016, 8:41 AM

## 2016-06-13 ENCOUNTER — Ambulatory Visit (HOSPITAL_COMMUNITY)
Admission: RE | Admit: 2016-06-13 | Discharge: 2016-06-13 | Disposition: A | Discharge status: Home / Self Care | Payer: Medicare HMO | Source: Ambulatory Visit | Attending: Cardiovascular Disease | Admitting: Cardiovascular Disease

## 2016-06-13 DIAGNOSIS — R011 Cardiac murmur, unspecified: Secondary | ICD-10-CM

## 2016-06-13 DIAGNOSIS — E785 Hyperlipidemia, unspecified: Secondary | ICD-10-CM | POA: Diagnosis not present

## 2016-06-13 DIAGNOSIS — I4891 Unspecified atrial fibrillation: Secondary | ICD-10-CM | POA: Insufficient documentation

## 2016-06-13 NOTE — Progress Notes (Signed)
*  PRELIMINARY RESULTS* Echocardiogram 2D Echocardiogram has been performed.  Leavy Cella 06/13/2016, 10:01 AM

## 2017-08-16 DIAGNOSIS — Z6841 Body Mass Index (BMI) 40.0 and over, adult: Secondary | ICD-10-CM | POA: Diagnosis not present

## 2017-08-16 DIAGNOSIS — E782 Mixed hyperlipidemia: Secondary | ICD-10-CM | POA: Diagnosis not present

## 2017-08-16 DIAGNOSIS — Z1389 Encounter for screening for other disorder: Secondary | ICD-10-CM | POA: Diagnosis not present

## 2017-08-16 DIAGNOSIS — I87393 Chronic venous hypertension (idiopathic) with other complications of bilateral lower extremity: Secondary | ICD-10-CM | POA: Diagnosis not present

## 2017-08-16 DIAGNOSIS — E039 Hypothyroidism, unspecified: Secondary | ICD-10-CM | POA: Diagnosis not present

## 2017-08-16 DIAGNOSIS — E785 Hyperlipidemia, unspecified: Secondary | ICD-10-CM | POA: Diagnosis not present

## 2017-08-16 DIAGNOSIS — E063 Autoimmune thyroiditis: Secondary | ICD-10-CM | POA: Diagnosis not present

## 2017-08-16 DIAGNOSIS — Z0001 Encounter for general adult medical examination with abnormal findings: Secondary | ICD-10-CM | POA: Diagnosis not present

## 2017-08-16 DIAGNOSIS — Z0000 Encounter for general adult medical examination without abnormal findings: Secondary | ICD-10-CM | POA: Diagnosis not present

## 2017-08-22 ENCOUNTER — Other Ambulatory Visit (HOSPITAL_COMMUNITY): Payer: Self-pay | Admitting: Family Medicine

## 2017-08-22 DIAGNOSIS — Z1231 Encounter for screening mammogram for malignant neoplasm of breast: Secondary | ICD-10-CM

## 2017-08-30 ENCOUNTER — Ambulatory Visit (HOSPITAL_COMMUNITY)
Admission: RE | Admit: 2017-08-30 | Discharge: 2017-08-30 | Disposition: A | Discharge status: Home / Self Care | Payer: Medicare HMO | Source: Ambulatory Visit | Attending: Family Medicine | Admitting: Family Medicine

## 2017-08-30 ENCOUNTER — Encounter (HOSPITAL_COMMUNITY): Payer: Self-pay

## 2017-08-30 DIAGNOSIS — Z1231 Encounter for screening mammogram for malignant neoplasm of breast: Secondary | ICD-10-CM | POA: Diagnosis not present

## 2017-10-02 DIAGNOSIS — I872 Venous insufficiency (chronic) (peripheral): Secondary | ICD-10-CM | POA: Diagnosis not present

## 2017-10-02 DIAGNOSIS — Z6841 Body Mass Index (BMI) 40.0 and over, adult: Secondary | ICD-10-CM | POA: Diagnosis not present

## 2017-10-02 DIAGNOSIS — R600 Localized edema: Secondary | ICD-10-CM | POA: Diagnosis not present

## 2017-10-13 DIAGNOSIS — N182 Chronic kidney disease, stage 2 (mild): Secondary | ICD-10-CM | POA: Diagnosis not present

## 2017-10-13 DIAGNOSIS — Z6841 Body Mass Index (BMI) 40.0 and over, adult: Secondary | ICD-10-CM | POA: Diagnosis not present

## 2018-08-15 DIAGNOSIS — Z6841 Body Mass Index (BMI) 40.0 and over, adult: Secondary | ICD-10-CM | POA: Diagnosis not present

## 2018-08-15 DIAGNOSIS — Z0000 Encounter for general adult medical examination without abnormal findings: Secondary | ICD-10-CM | POA: Diagnosis not present

## 2018-08-15 DIAGNOSIS — E039 Hypothyroidism, unspecified: Secondary | ICD-10-CM | POA: Diagnosis not present

## 2018-08-15 DIAGNOSIS — Z1389 Encounter for screening for other disorder: Secondary | ICD-10-CM | POA: Diagnosis not present

## 2018-08-15 DIAGNOSIS — Z0001 Encounter for general adult medical examination with abnormal findings: Secondary | ICD-10-CM | POA: Diagnosis not present

## 2018-10-23 ENCOUNTER — Other Ambulatory Visit (HOSPITAL_COMMUNITY): Payer: Self-pay | Admitting: Family Medicine

## 2018-10-23 DIAGNOSIS — Z1231 Encounter for screening mammogram for malignant neoplasm of breast: Secondary | ICD-10-CM

## 2018-10-31 ENCOUNTER — Inpatient Hospital Stay (HOSPITAL_COMMUNITY): Admission: RE | Admit: 2018-10-31 | Payer: Medicare HMO | Source: Ambulatory Visit

## 2019-09-10 DIAGNOSIS — I872 Venous insufficiency (chronic) (peripheral): Secondary | ICD-10-CM | POA: Diagnosis not present

## 2019-09-10 DIAGNOSIS — R7309 Other abnormal glucose: Secondary | ICD-10-CM | POA: Diagnosis not present

## 2019-09-10 DIAGNOSIS — E7849 Other hyperlipidemia: Secondary | ICD-10-CM | POA: Diagnosis not present

## 2019-09-10 DIAGNOSIS — Z1389 Encounter for screening for other disorder: Secondary | ICD-10-CM | POA: Diagnosis not present

## 2019-09-10 DIAGNOSIS — G4730 Sleep apnea, unspecified: Secondary | ICD-10-CM | POA: Diagnosis not present

## 2019-09-10 DIAGNOSIS — E039 Hypothyroidism, unspecified: Secondary | ICD-10-CM | POA: Diagnosis not present

## 2019-09-10 DIAGNOSIS — Z6841 Body Mass Index (BMI) 40.0 and over, adult: Secondary | ICD-10-CM | POA: Diagnosis not present

## 2019-09-10 DIAGNOSIS — Z0001 Encounter for general adult medical examination with abnormal findings: Secondary | ICD-10-CM | POA: Diagnosis not present

## 2020-02-10 ENCOUNTER — Emergency Department (HOSPITAL_COMMUNITY): Payer: Medicare HMO

## 2020-02-10 ENCOUNTER — Other Ambulatory Visit: Payer: Self-pay

## 2020-02-10 ENCOUNTER — Inpatient Hospital Stay (HOSPITAL_COMMUNITY)
Admission: EM | Admit: 2020-02-10 | Discharge: 2020-02-14 | DRG: 177 | Disposition: A | Discharge status: Home Health | Payer: Medicare HMO | Attending: Internal Medicine | Admitting: Internal Medicine

## 2020-02-10 ENCOUNTER — Encounter (HOSPITAL_COMMUNITY): Payer: Self-pay | Admitting: Emergency Medicine

## 2020-02-10 DIAGNOSIS — N1831 Chronic kidney disease, stage 3a: Secondary | ICD-10-CM | POA: Diagnosis present

## 2020-02-10 DIAGNOSIS — Z79890 Hormone replacement therapy (postmenopausal): Secondary | ICD-10-CM

## 2020-02-10 DIAGNOSIS — R0602 Shortness of breath: Secondary | ICD-10-CM | POA: Diagnosis not present

## 2020-02-10 DIAGNOSIS — M79605 Pain in left leg: Secondary | ICD-10-CM | POA: Diagnosis not present

## 2020-02-10 DIAGNOSIS — D696 Thrombocytopenia, unspecified: Secondary | ICD-10-CM

## 2020-02-10 DIAGNOSIS — R7989 Other specified abnormal findings of blood chemistry: Secondary | ICD-10-CM

## 2020-02-10 DIAGNOSIS — T380X5A Adverse effect of glucocorticoids and synthetic analogues, initial encounter: Secondary | ICD-10-CM | POA: Diagnosis not present

## 2020-02-10 DIAGNOSIS — Z79899 Other long term (current) drug therapy: Secondary | ICD-10-CM

## 2020-02-10 DIAGNOSIS — R600 Localized edema: Secondary | ICD-10-CM | POA: Diagnosis not present

## 2020-02-10 DIAGNOSIS — E782 Mixed hyperlipidemia: Secondary | ICD-10-CM | POA: Diagnosis not present

## 2020-02-10 DIAGNOSIS — J1282 Pneumonia due to coronavirus disease 2019: Secondary | ICD-10-CM | POA: Diagnosis not present

## 2020-02-10 DIAGNOSIS — N1832 Chronic kidney disease, stage 3b: Secondary | ICD-10-CM

## 2020-02-10 DIAGNOSIS — N179 Acute kidney failure, unspecified: Secondary | ICD-10-CM

## 2020-02-10 DIAGNOSIS — U071 COVID-19: Principal | ICD-10-CM | POA: Diagnosis present

## 2020-02-10 DIAGNOSIS — R739 Hyperglycemia, unspecified: Secondary | ICD-10-CM | POA: Diagnosis not present

## 2020-02-10 DIAGNOSIS — J9601 Acute respiratory failure with hypoxia: Secondary | ICD-10-CM

## 2020-02-10 DIAGNOSIS — B372 Candidiasis of skin and nail: Secondary | ICD-10-CM

## 2020-02-10 DIAGNOSIS — I4510 Unspecified right bundle-branch block: Secondary | ICD-10-CM | POA: Diagnosis present

## 2020-02-10 DIAGNOSIS — R0600 Dyspnea, unspecified: Secondary | ICD-10-CM | POA: Diagnosis not present

## 2020-02-10 DIAGNOSIS — L539 Erythematous condition, unspecified: Secondary | ICD-10-CM | POA: Diagnosis present

## 2020-02-10 DIAGNOSIS — Z6841 Body Mass Index (BMI) 40.0 and over, adult: Secondary | ICD-10-CM | POA: Diagnosis not present

## 2020-02-10 DIAGNOSIS — E039 Hypothyroidism, unspecified: Secondary | ICD-10-CM | POA: Diagnosis not present

## 2020-02-10 DIAGNOSIS — R7303 Prediabetes: Secondary | ICD-10-CM | POA: Diagnosis present

## 2020-02-10 DIAGNOSIS — E785 Hyperlipidemia, unspecified: Secondary | ICD-10-CM | POA: Diagnosis not present

## 2020-02-10 DIAGNOSIS — E8809 Other disorders of plasma-protein metabolism, not elsewhere classified: Secondary | ICD-10-CM | POA: Diagnosis not present

## 2020-02-10 DIAGNOSIS — I129 Hypertensive chronic kidney disease with stage 1 through stage 4 chronic kidney disease, or unspecified chronic kidney disease: Secondary | ICD-10-CM | POA: Diagnosis present

## 2020-02-10 DIAGNOSIS — M1990 Unspecified osteoarthritis, unspecified site: Secondary | ICD-10-CM | POA: Diagnosis present

## 2020-02-10 DIAGNOSIS — E441 Mild protein-calorie malnutrition: Secondary | ICD-10-CM | POA: Diagnosis not present

## 2020-02-10 DIAGNOSIS — M79606 Pain in leg, unspecified: Secondary | ICD-10-CM | POA: Diagnosis not present

## 2020-02-10 DIAGNOSIS — M79604 Pain in right leg: Secondary | ICD-10-CM | POA: Diagnosis not present

## 2020-02-10 LAB — COMPREHENSIVE METABOLIC PANEL
ALT: 30 U/L (ref 0–44)
AST: 38 U/L (ref 15–41)
Albumin: 3.2 g/dL — ABNORMAL LOW (ref 3.5–5.0)
Alkaline Phosphatase: 42 U/L (ref 38–126)
Anion gap: 8 (ref 5–15)
BUN: 25 mg/dL — ABNORMAL HIGH (ref 8–23)
CO2: 27 mmol/L (ref 22–32)
Calcium: 8 mg/dL — ABNORMAL LOW (ref 8.9–10.3)
Chloride: 102 mmol/L (ref 98–111)
Creatinine, Ser: 1.37 mg/dL — ABNORMAL HIGH (ref 0.44–1.00)
GFR, Estimated: 43 mL/min — ABNORMAL LOW (ref >60)
Glucose, Bld: 108 mg/dL — ABNORMAL HIGH (ref 70–99)
Potassium: 4.1 mmol/L (ref 3.5–5.1)
Sodium: 137 mmol/L (ref 135–145)
Total Bilirubin: 0.7 mg/dL (ref 0.3–1.2)
Total Protein: 5.9 g/dL — ABNORMAL LOW (ref 6.5–8.1)

## 2020-02-10 LAB — CBC
HCT: 40.1 % (ref 36.0–46.0)
Hemoglobin: 12.3 g/dL (ref 12.0–15.0)
MCH: 27.9 pg (ref 26.0–34.0)
MCHC: 30.7 g/dL (ref 30.0–36.0)
MCV: 90.9 fL (ref 80.0–100.0)
Platelets: 146 10*3/uL — ABNORMAL LOW (ref 150–400)
RBC: 4.41 MIL/uL (ref 3.87–5.11)
RDW: 14.3 % (ref 11.5–15.5)
WBC: 4.8 10*3/uL (ref 4.0–10.5)
nRBC: 0 % (ref 0.0–0.2)

## 2020-02-10 LAB — PROCALCITONIN: Procalcitonin: 0.12 ng/mL

## 2020-02-10 LAB — LACTATE DEHYDROGENASE: LDH: 210 U/L — ABNORMAL HIGH (ref 98–192)

## 2020-02-10 LAB — C-REACTIVE PROTEIN: CRP: 11.7 mg/dL — ABNORMAL HIGH (ref <1.0)

## 2020-02-10 LAB — RESP PANEL BY RT-PCR (FLU A&B, COVID) ARPGX2
Influenza A by PCR: NEGATIVE
Influenza B by PCR: NEGATIVE
SARS Coronavirus 2 by RT PCR: POSITIVE — AB

## 2020-02-10 LAB — CBG MONITORING, ED: Glucose-Capillary: 114 mg/dL — ABNORMAL HIGH (ref 70–99)

## 2020-02-10 LAB — FERRITIN: Ferritin: 190 ng/mL (ref 11–307)

## 2020-02-10 LAB — TRIGLYCERIDES: Triglycerides: 126 mg/dL (ref <150)

## 2020-02-10 LAB — D-DIMER, QUANTITATIVE: D-Dimer, Quant: 3.3 ug/mL-FEU — ABNORMAL HIGH (ref 0.00–0.50)

## 2020-02-10 LAB — FIBRINOGEN: Fibrinogen: 481 mg/dL — ABNORMAL HIGH (ref 210–475)

## 2020-02-10 LAB — LACTIC ACID, PLASMA: Lactic Acid, Venous: 0.7 mmol/L (ref 0.5–1.9)

## 2020-02-10 MED ORDER — ALBUTEROL SULFATE HFA 108 (90 BASE) MCG/ACT IN AERS
2.0000 | INHALATION_SPRAY | Freq: Four times a day (QID) | RESPIRATORY_TRACT | Status: DC
  Administered 2020-02-11 – 2020-02-13 (×9): 2 via RESPIRATORY_TRACT
  Filled 2020-02-10: qty 6.7

## 2020-02-10 MED ORDER — ONDANSETRON HCL 4 MG/2ML IJ SOLN: 4.0000 mg | Freq: Four times a day (QID) | INTRAMUSCULAR | Status: DC | PRN

## 2020-02-10 MED ORDER — SIMVASTATIN 20 MG PO TABS
20.0000 mg | ORAL_TABLET | Freq: Every day | ORAL | Status: DC
  Administered 2020-02-10 – 2020-02-13 (×4): 20 mg via ORAL
  Filled 2020-02-10: qty 1
  Filled 2020-02-10: qty 2
  Filled 2020-02-10 (×2): qty 1

## 2020-02-10 MED ORDER — ACETAMINOPHEN 325 MG PO TABS: 650.0000 mg | ORAL_TABLET | Freq: Four times a day (QID) | ORAL | Status: DC | PRN

## 2020-02-10 MED ORDER — VITAMIN D 25 MCG (1000 UNIT) PO TABS
1000.0000 [IU] | ORAL_TABLET | Freq: Every day | ORAL | Status: DC
  Administered 2020-02-11 – 2020-02-14 (×4): 1000 [IU] via ORAL
  Filled 2020-02-10 (×4): qty 1

## 2020-02-10 MED ORDER — GUAIFENESIN-DM 100-10 MG/5ML PO SYRP
10.0000 mL | ORAL_SOLUTION | ORAL | Status: DC | PRN
  Administered 2020-02-14: 10 mL via ORAL
  Filled 2020-02-10: qty 10

## 2020-02-10 MED ORDER — ONDANSETRON HCL 4 MG PO TABS: 4.0000 mg | ORAL_TABLET | Freq: Four times a day (QID) | ORAL | Status: DC | PRN

## 2020-02-10 MED ORDER — ENOXAPARIN SODIUM 80 MG/0.8ML ~~LOC~~ SOLN
80.0000 mg | SUBCUTANEOUS | Status: DC
  Administered 2020-02-11: 80 mg via SUBCUTANEOUS
  Filled 2020-02-10: qty 0.8

## 2020-02-10 MED ORDER — PREDNISONE 20 MG PO TABS
50.0000 mg | ORAL_TABLET | Freq: Every day | ORAL | Status: DC
  Administered 2020-02-14: 50 mg via ORAL
  Filled 2020-02-10: qty 1

## 2020-02-10 MED ORDER — DM-GUAIFENESIN ER 30-600 MG PO TB12
1.0000 | ORAL_TABLET | Freq: Two times a day (BID) | ORAL | Status: DC
  Administered 2020-02-10 – 2020-02-14 (×8): 1 via ORAL
  Filled 2020-02-10 (×8): qty 1

## 2020-02-10 MED ORDER — HYDROCOD POLST-CPM POLST ER 10-8 MG/5ML PO SUER: 5.0000 mL | Freq: Two times a day (BID) | ORAL | Status: DC | PRN

## 2020-02-10 MED ORDER — ENSURE ENLIVE PO LIQD
237.0000 mL | Freq: Two times a day (BID) | ORAL | Status: DC
  Administered 2020-02-11: 237 mL via ORAL
  Filled 2020-02-10 (×4): qty 237

## 2020-02-10 MED ORDER — DEXAMETHASONE SODIUM PHOSPHATE 10 MG/ML IJ SOLN
10.0000 mg | Freq: Once | INTRAMUSCULAR | Status: AC
  Administered 2020-02-10: 10 mg via INTRAVENOUS
  Filled 2020-02-10: qty 1

## 2020-02-10 MED ORDER — ACETAMINOPHEN 500 MG PO TABS
1000.0000 mg | ORAL_TABLET | Freq: Once | ORAL | Status: AC
  Administered 2020-02-10: 1000 mg via ORAL
  Filled 2020-02-10: qty 2

## 2020-02-10 MED ORDER — SODIUM CHLORIDE 0.9 % IV SOLN: 100.0000 mg | Freq: Every day | INTRAVENOUS | Status: DC

## 2020-02-10 MED ORDER — NYSTATIN 100000 UNIT/GM EX POWD
Freq: Two times a day (BID) | CUTANEOUS | Status: DC
  Filled 2020-02-10 (×4): qty 15

## 2020-02-10 MED ORDER — INSULIN ASPART 100 UNIT/ML ~~LOC~~ SOLN
0.0000 [IU] | Freq: Every day | SUBCUTANEOUS | Status: DC
  Filled 2020-02-10: qty 1

## 2020-02-10 MED ORDER — INSULIN ASPART 100 UNIT/ML ~~LOC~~ SOLN
0.0000 [IU] | Freq: Three times a day (TID) | SUBCUTANEOUS | Status: DC
  Administered 2020-02-12: 4 [IU] via SUBCUTANEOUS
  Administered 2020-02-12: 3 [IU] via SUBCUTANEOUS
  Administered 2020-02-12: 4 [IU] via SUBCUTANEOUS
  Administered 2020-02-13: 3 [IU] via SUBCUTANEOUS
  Administered 2020-02-13: 4 [IU] via SUBCUTANEOUS
  Administered 2020-02-13: 3 [IU] via SUBCUTANEOUS

## 2020-02-10 MED ORDER — METHYLPREDNISOLONE SODIUM SUCC 125 MG IJ SOLR
0.5000 mg/kg | Freq: Two times a day (BID) | INTRAMUSCULAR | Status: AC
  Administered 2020-02-11 – 2020-02-13 (×5): 93.75 mg via INTRAVENOUS
  Filled 2020-02-10 (×6): qty 2

## 2020-02-10 MED ORDER — SODIUM CHLORIDE 0.9 % IV SOLN
100.0000 mg | INTRAVENOUS | Status: AC
  Administered 2020-02-10 (×2): 100 mg via INTRAVENOUS
  Filled 2020-02-10 (×2): qty 20

## 2020-02-10 MED ORDER — ACETAMINOPHEN 325 MG PO TABS
650.0000 mg | ORAL_TABLET | Freq: Four times a day (QID) | ORAL | Status: DC | PRN
  Administered 2020-02-11 – 2020-02-12 (×3): 650 mg via ORAL
  Filled 2020-02-10 (×3): qty 2

## 2020-02-10 MED ORDER — LEVOTHYROXINE SODIUM 25 MCG PO TABS
125.0000 ug | ORAL_TABLET | Freq: Every day | ORAL | Status: DC
  Administered 2020-02-11 – 2020-02-14 (×4): 125 ug via ORAL
  Filled 2020-02-10: qty 3
  Filled 2020-02-10 (×3): qty 1

## 2020-02-10 MED ORDER — SODIUM CHLORIDE 0.9 % IV SOLN: 200.0000 mg | Freq: Once | INTRAVENOUS | Status: DC

## 2020-02-10 MED ORDER — SODIUM CHLORIDE 0.9 % IV SOLN
100.0000 mg | Freq: Every day | INTRAVENOUS | Status: AC
  Administered 2020-02-11 – 2020-02-14 (×4): 100 mg via INTRAVENOUS
  Filled 2020-02-10 (×4): qty 20

## 2020-02-10 MED ORDER — PANTOPRAZOLE SODIUM 40 MG IV SOLR
40.0000 mg | Freq: Every day | INTRAVENOUS | Status: DC
  Administered 2020-02-10 – 2020-02-13 (×4): 40 mg via INTRAVENOUS
  Filled 2020-02-10 (×4): qty 40

## 2020-02-10 NOTE — ED Notes (Signed)
Attempted to reach lab to draw labs in 7. No answer

## 2020-02-10 NOTE — ED Notes (Addendum)
purewick leaked. Full linen change and pericare completed

## 2020-02-10 NOTE — ED Provider Notes (Signed)
[facility] EMERGENCY DEPARTMENT Provider Note   CSN: [id number] Arrival date & time: 02/10/20  1514     History Chief Complaint  Patient presents with  . Shortness of Breath    COVID +    Angela [name] is a 64 y.o. female.  HPI Presents via EMS with concern for Covid positive status, hypoxia.  Patient states that she is generally well.  She does have a history of thyroid disorder.  About 4 days ago she developed URI-like illness, with cough, dyspnea, fatigue.  Subsequently, she tested positive for coronavirus.  She notes that in the interval since that test she has felt progressively weaker, more fatigued, with ongoing dyspnea, cough purulent no vomiting, no syncope. No relief with OTC medication. Per EMS the patient had saturation 85% on room air, this improved to 92% with 3 L via nasal cannula.     Past Medical History:  Diagnosis Date  . Arthritis   . Thyroid disease     Patient Active Problem List   Diagnosis Date Noted  . Hyperlipidemia 06/06/2016  . 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  . Undiagnosed cardiac murmurs 06/06/2016  . Hypothyroid 06/06/2016  . Atrial fibrillation ([place]) 06/06/2016  . Morbid obesity ([place]) 06/06/2016  . Chronic lymphocytic thyroiditis 06/06/2016    Past Surgical History:  Procedure Laterality Date  . CARPAL TUNNEL RELEASE     right hand  . CESAREAN SECTION    . HERNIA REPAIR       OB History   No obstetric history on file.     Family History  Problem Relation Age of Onset  . Cancer Maternal Grandfather     Social History   Tobacco Use  . Smoking status: Never Smoker  . Smokeless tobacco: Never Used  Substance Use Topics  . Alcohol use: No  . Drug use: No    Home Medications Prior to Admission medications   Medication Sig Start Date End Date Taking? Authorizing Provider  acetaminophen (TYLENOL) 500 MG tablet Take 1,000 mg by mouth every 6 (six) hours as needed for headache.    [provider]  furosemide  (LASIX) 40 MG tablet Take 40 mg by mouth daily as needed.    [provider]  levothyroxine (SYNTHROID) 125 MCG tablet Take 125 mcg by mouth daily. 10/29/19   [provider]  levothyroxine (SYNTHROID) 88 MCG tablet Take by mouth.    [provider]  levothyroxine (SYNTHROID, LEVOTHROID) 100 MCG tablet Take 100 mcg by mouth daily before breakfast.    [provider]  mupirocin ointment (BACTROBAN) 2 % Apply 1 application topically See admin instructions. Use 1 application topically 2 to 3 times daily 09/10/19   [provider]  simvastatin (ZOCOR) 20 MG tablet Take 20 mg by mouth at bedtime. 09/10/19   [provider]    Allergies    Patient has no known allergies.  Review of Systems   Review of Systems  Constitutional:       Per HPI, otherwise negative  HENT:       Per HPI, otherwise negative  Respiratory:       Per HPI, otherwise negative  Cardiovascular:       Per HPI, otherwise negative  Gastrointestinal: Negative for vomiting.  Endocrine:       Negative aside from HPI  Genitourinary:       Neg aside from HPI   Musculoskeletal:       Per HPI, otherwise negative  Skin: Negative.  Neurological: Positive for weakness. Negative for syncope.    Physical Exam Updated Vital Signs BP 113/79   Pulse 66   Temp (!) 101.1 F (38.4 C) (Oral)   Resp (!) 26   Ht 5\' 11"  (1.803 m)   Wt (!) 187 kg   SpO2 96%   BMI 57.50 kg/m   Physical Exam Vitals and nursing note reviewed.  Constitutional:      General: She is not in acute distress.    Appearance: She is obese.     Comments: Morbidly obese adult female awake and alert sitting upright with nasal cannula in place.  HENT:     Head: Normocephalic and atraumatic.  Eyes:     Conjunctiva/sclera: Conjunctivae normal.  Pulmonary:     Effort: Tachypnea present.  Abdominal:     General: There is no distension.  Skin:    General: Skin is warm and dry.     Comments: Bilateral  inframammary rash.  Neurological:     Mental Status: She is alert and oriented to person, place, and time.     Cranial Nerves: No cranial nerve deficit.      ED Results / Procedures / Treatments   Labs (all labs ordered are listed, but only abnormal results are displayed) Labs Reviewed  RESP PANEL BY RT-PCR (FLU A&B, COVID) ARPGX2 - Abnormal; Notable for the following components:      Result Value   SARS Coronavirus 2 by RT PCR POSITIVE (*)    All other components within normal limits  CBC - Abnormal; Notable for the following components:   Platelets 146 (*)    All other components within normal limits  COMPREHENSIVE METABOLIC PANEL - Abnormal; Notable for the following components:   Glucose, Bld 108 (*)    BUN 25 (*)    Creatinine, Ser 1.37 (*)    Calcium 8.0 (*)    Total Protein 5.9 (*)    Albumin 3.2 (*)    GFR, Estimated 43 (*)    All other components within normal limits  D-DIMER, QUANTITATIVE (NOT AT [facility]) - Abnormal; Notable for the following components:   D-Dimer, Quant 3.30 (*)    All other components within normal limits  LACTATE DEHYDROGENASE - Abnormal; Notable for the following components:   LDH 210 (*)    All other components within normal limits  FIBRINOGEN - Abnormal; Notable for the following components:   Fibrinogen 481 (*)    All other components within normal limits  C-REACTIVE PROTEIN - Abnormal; Notable for the following components:   CRP 11.7 (*)    All other components within normal limits  CULTURE, BLOOD (ROUTINE X 2)  CULTURE, BLOOD (ROUTINE X 2)  LACTIC ACID, PLASMA  PROCALCITONIN  FERRITIN  TRIGLYCERIDES  LACTIC ACID, PLASMA    EKG Sinus rhythm, rate 65, right bundle branch block, artifact, abnormal On cardiac monitor the patient is rate 60s, sinus, unremarkable. Pulse oximetry 96% with 3 L via nasal cannula, normal  Radiology DG Chest Portable 1 View  Result Date: 02/10/2020 CLINICAL DATA:  COVID positive with shortness of breath.  EXAM: PORTABLE CHEST 1 VIEW COMPARISON:  August 18, 2005 FINDINGS: Mild multifocal infiltrates are seen throughout both lungs. There is no evidence of a pleural effusion or pneumothorax. The cardiac silhouette is mildly enlarged. The visualized skeletal structures are unremarkable. IMPRESSION: Mild bilateral multifocal infiltrates. Electronically Signed   By: Virgina Norfolk M.D.   On: 02/10/2020 16:05    Procedures Procedures (including critical care time)  Medications Ordered in ED Medications  acetaminophen (TYLENOL) tablet 1,000 mg (1,000 mg Oral Given 02/10/20 1653)  dexamethasone (DECADRON) injection 10 mg (10 mg Intravenous Given 02/10/20 1843)    ED Course  I have reviewed the triage vital signs and the nursing notes.  Pertinent labs & imaging results that were available during my care of the patient were reviewed by me and considered in my medical decision making (see chart for details).    MDM Rules/Calculators/A&P Morbidly obese adult female presents with approximately 4 days of URI-like illness, recent Covid positive test.  Here patient is awake and alert, febrile, hypoxic, but with improvement following supplemental oxygen.  Patient's x-ray consistent with Covid pneumonia.   Final Clinical Impression(s) / ED Diagnoses Final diagnoses:  Pneumonia due to COVID-19 virus   MDM Number of Diagnoses or Management Options Pneumonia due to COVID-19 virus: new, needed workup   Amount and/or Complexity of Data Reviewed Clinical lab tests: reviewed Tests in the radiology section of CPT: reviewed Tests in the medicine section of CPT: reviewed Decide to obtain previous medical records or to obtain history from someone other than the patient: yes Obtain history from someone other than the patient: yes Review and summarize past medical records: yes Discuss the patient with other providers: yes Independent visualization of images, tracings, or specimens: yes  Risk of  Complications, Morbidity, and/or Mortality Presenting problems: high Diagnostic procedures: high Management options: high  Critical Care Total time providing critical care: < 30 minutes  Patient Progress Patient progress: stable    Carmin Muskrat, MD 02/10/20 2220

## 2020-02-10 NOTE — H&P (Signed)
History and Physical  Angela [name] WIO:[id number] DOB: 1955-04-02 DOA: 02/10/2020  Referring physician: Carmin Muskrat, MD PCP: Sharilyn Sites, MD  Patient coming from: Home  Chief Complaint: Shortness of breath  HPI: Angela [name] is a 64 y.o. female with medical history significant for hypertension, hypothyroidism, hyperlipidemia and obesity who presents to the emergency department due to increasing shortness of breath due to positive Covid status.  Symptoms started on Friday (12/3) with cough (nonproductive), shortness of breath and fatigue.  She took a home Covid test on same day and tested positive, patient has progressively become weaker and have developed increasing work of breathing which worsens on exertion.  She took over-the-counter medications without any relief.  This morning, she noted that her pulse ox was 80%, so she activated EMS.  On arrival of EMS, patient was noted to have an O2 sat of 85% on room air, she was placed on supplemental oxygen via [place] at 3 LPM with improvement of O2 sat to 92%. Patient has not had any Covid vaccine.  ED Course:  In the emergency department, he was noted to be febrile with a temperature of 101.63F and initially tachypneic.  O2 sat ranged from 92-96% on supplemental oxygen via [place] at 3.5 LPM.  Work-up in the ED showed thrombocytopenia, BUN/creatinine 25/1.37 (no prior labs for comparison).  Hypoalbuminemia, D-dimer 3.30 procalcitonin 0.12, CRP 11.7, ferritin 190, fibrinogen 481. SARS coronavirus 2 was positive. Chest x-ray showed mild bilateral multifocal infiltrates. Tylenol was given due to fever, IV Decadron was given. Hospitalist was asked to admit patient for further evaluation and management.  Review of Systems: Constitutional: Negative for chills and fever.  HENT: Negative for ear pain and sore throat.   Eyes: Negative for pain and visual disturbance.  Respiratory: Positive for cough and shortness of breath. Cardiovascular: Negative  for chest pain and palpitations.  Gastrointestinal: Negative for abdominal pain and vomiting.  Endocrine: Negative for polyphagia and polyuria.  Genitourinary: Negative for decreased urine volume, dysuria, enuresis Musculoskeletal: Negative for arthralgias and back pain.  Skin: Erythematous rash noted inframammary bilaterally as well as in abdominal folds and intergluteal cleft Allergic/Immunologic: Negative for immunocompromised state.  Neurological: Positive for weakness. Negative for tremors, syncope, speech difficulty Hematological: Does not bruise/bleed easily.  All other systems reviewed and are negative  Past Medical History:  Diagnosis Date  . Arthritis   . Thyroid disease    Past Surgical History:  Procedure Laterality Date  . CARPAL TUNNEL RELEASE     right hand  . CESAREAN SECTION    . HERNIA REPAIR      Social History:  reports that she has never smoked. She has never used smokeless tobacco. She reports that she does not drink alcohol and does not use drugs.   No Known Allergies  Family History  Problem Relation Age of Onset  . Cancer Maternal Grandfather     Prior to Admission medications   Medication Sig Start Date End Date Taking? Authorizing Provider  acetaminophen (TYLENOL) 500 MG tablet Take 1,000 mg by mouth every 6 (six) hours as needed for headache.    [provider]  furosemide (LASIX) 40 MG tablet Take 40 mg by mouth daily as needed.    [provider]  levothyroxine (SYNTHROID) 125 MCG tablet Take 125 mcg by mouth daily. 10/29/19   [provider]  levothyroxine (SYNTHROID) 88 MCG tablet Take by mouth.    [provider]  levothyroxine (SYNTHROID, LEVOTHROID) 100 MCG tablet Take 100 mcg  by mouth daily before breakfast.    [provider]  mupirocin ointment (BACTROBAN) 2 % Apply 1 application topically See admin instructions. Use 1 application topically 2 to 3 times daily 09/10/19   [provider]   simvastatin (ZOCOR) 20 MG tablet Take 20 mg by mouth at bedtime. 09/10/19   [provider]    Physical Exam: BP 119/66   Pulse (!) 59   Temp 98.8 F (37.1 C) (Oral)   Resp 19   Ht 5\' 11"  (1.803 m)   Wt (!) 187 kg   SpO2 92%   BMI 57.50 kg/m   . General: 64 y.o. year-old female morbidly obese but not in any acute distress.  Alert and oriented x3. Marland Kitchen HEENT: NCAT, EOMI . Neck: Supple, trachea medial . Cardiovascular: Regular rate and rhythm with no rubs or gallops.  No thyromegaly or JVD noted.   Marland Kitchen Respiratory: Tachypnea. Clear to auscultation with no wheezes or rales. . Abdomen: Soft nontender nondistended with normal bowel sounds x4 quadrants. . Muskuloskeletal: No cyanosis, clubbing or edema noted bilaterally . Neuro: CN II-XII intact, strength, sensation, reflexes . Skin: No ulcerative lesions noted or rashes . Psychiatry: Judgement and insight appear normal. Mood is appropriate for condition and setting          Labs on Admission:  Basic Metabolic Panel: Recent Labs  Lab 02/10/20 2030  NA 137  K 4.1  CL 102  CO2 27  GLUCOSE 108*  BUN 25*  CREATININE 1.37*  CALCIUM 8.0*   Liver Function Tests: Recent Labs  Lab 02/10/20 2030  AST 38  ALT 30  ALKPHOS 42  BILITOT 0.7  PROT 5.9*  ALBUMIN 3.2*   No results for input(s): LIPASE, AMYLASE in the last 168 hours. No results for input(s): AMMONIA in the last 168 hours. CBC: Recent Labs  Lab 02/10/20 2030  WBC 4.8  HGB 12.3  HCT 40.1  MCV 90.9  PLT 146*   Cardiac Enzymes: No results for input(s): CKTOTAL, CKMB, CKMBINDEX, TROPONINI in the last 168 hours.  BNP (last 3 results) No results for input(s): BNP in the last 8760 hours.  ProBNP (last 3 results) No results for input(s): PROBNP in the last 8760 hours.  CBG: No results for input(s): GLUCAP in the last 168 hours.  Radiological Exams on Admission: DG Chest Portable 1 View  Result Date: 02/10/2020 CLINICAL DATA:  COVID positive with  shortness of breath. EXAM: PORTABLE CHEST 1 VIEW COMPARISON:  August 18, 2005 FINDINGS: Mild multifocal infiltrates are seen throughout both lungs. There is no evidence of a pleural effusion or pneumothorax. The cardiac silhouette is mildly enlarged. The visualized skeletal structures are unremarkable. IMPRESSION: Mild bilateral multifocal infiltrates. Electronically Signed   By: Virgina Norfolk M.D.   On: 02/10/2020 16:05    EKG: I independently viewed the EKG done and my findings are as followed: EKG was not done in the ED  Assessment/Plan Present on Admission: . Pneumonia due to COVID-19 virus . Hyperlipidemia . Hypothyroid . Morbid obesity ([place])  Principal Problem:   Pneumonia due to COVID-19 virus Active Problems:   Hyperlipidemia   Hypothyroid   Morbid obesity ([place])   Thrombocytopenia ([facility])   Hypoalbuminemia   Elevated d-dimer   AKI (acute kidney injury) ([place])   Acute respiratory failure with hypoxia ([facility])   Candidal intertrigo  Acute respiratory failure with hypoxia secondary to pneumonia due to COVID-19 virus infection SARS coronavirus 2 was positive Chest x-ray showed multifocal infiltrates Continue Tylenol 650  mg p.o. every 6 hours as needed for fever Continue albuterol q.6h Continue IV Solu-Medrol  Continue IV Remdesivir per pharmacy protocol Continue Mucinex, Robitussin and Tussionex Continue supplemental oxygen to maintain O2 sat > or = 94% with plan to wean patient off supplemental oxygen as tolerated (of note, patient does not use oxygen at baseline) Continue incentive spirometry and flutter valve q64min as tolerated Encourage proning, early ambulation, and side laying as tolerated Continue airborne isolation precaution Inflammatory markers: LDH: 210  CRP:  11.7 D-dimer: 3.3 Ferritin: 190 Fibrinogen: 481 Continue monitoring daily inflammatory markers Physician PPE:  Surgical mask with face shield, N-95, nonsterile gloves, disposable gown, head and shoe cover  s Patient PPE:  Face mask   Elevated D-dimer possibly secondary to above R/O PE D-dimer 3.3, consider CT of your chest with contrast once creatinine improves  Candidal intertrigo Erythematous rash noted inframammary bilaterally as well as in abdominal folds and intergluteal cleft Nystatin powder will be provided  Thrombocytopenia possibly reactive Platelets 146, no sign of bleeding, continue to monitor platelet levels  Hypoalbuminemia possible secondary mild protein calorie malnutrition Protein supplement will be provided  Acute kidney injury BUN/creatinine 25/1.37 (no prior labs for comparison) Renally adjust medications, avoid nephrotoxic agents/dehydration/hypotension  Hypothyroidism Continue Synthroid  Hyperlipidemia Continue Zocor   Morbid obesity Patient counseled on diet and lifestyle modification  DVT prophylaxis: Lovenox  Code Status: Full code  Family Communication: None at bedside  Disposition Plan:  Patient is from:                        home Anticipated DC to:                   SNF or family members home Anticipated DC date:               2-3 days Anticipated DC barriers:         Patient is unstable to be discharged at this time due to acute respiratory failure with hypoxia requiring supplemental oxygen due to COVID-19 pneumonia as well as inpatient treatment for the pneumonia.  Consults called: None  Admission status: Inpatient    Bernadette Hoit MD [facility]  02/10/2020, 11:00 PM

## 2020-02-10 NOTE — ED Triage Notes (Signed)
Pt from home via [facility]. Pt COVID positive on Friday 01/08/20. Pts RA O2 85%. Pt placed on 3L [place]. Now 92%.

## 2020-02-11 ENCOUNTER — Inpatient Hospital Stay (HOSPITAL_COMMUNITY): Payer: Medicare HMO

## 2020-02-11 LAB — FERRITIN: Ferritin: 230 ng/mL (ref 11–307)

## 2020-02-11 LAB — CBC WITH DIFFERENTIAL/PLATELET
Abs Immature Granulocytes: 0.02 10*3/uL (ref 0.00–0.07)
Basophils Absolute: 0 10*3/uL (ref 0.0–0.1)
Basophils Relative: 0 %
Eosinophils Absolute: 0 10*3/uL (ref 0.0–0.5)
Eosinophils Relative: 0 %
HCT: 41.4 % (ref 36.0–46.0)
Hemoglobin: 12.7 g/dL (ref 12.0–15.0)
Immature Granulocytes: 1 %
Lymphocytes Relative: 15 %
Lymphs Abs: 0.5 10*3/uL — ABNORMAL LOW (ref 0.7–4.0)
MCH: 27.5 pg (ref 26.0–34.0)
MCHC: 30.7 g/dL (ref 30.0–36.0)
MCV: 89.8 fL (ref 80.0–100.0)
Monocytes Absolute: 0.2 10*3/uL (ref 0.1–1.0)
Monocytes Relative: 7 %
Neutro Abs: 2.6 10*3/uL (ref 1.7–7.7)
Neutrophils Relative %: 77 %
Platelets: 138 10*3/uL — ABNORMAL LOW (ref 150–400)
RBC: 4.61 MIL/uL (ref 3.87–5.11)
RDW: 14 % (ref 11.5–15.5)
WBC: 3.4 10*3/uL — ABNORMAL LOW (ref 4.0–10.5)
nRBC: 0 % (ref 0.0–0.2)

## 2020-02-11 LAB — D-DIMER, QUANTITATIVE: D-Dimer, Quant: 3.43 ug/mL-FEU — ABNORMAL HIGH (ref 0.00–0.50)

## 2020-02-11 LAB — COMPREHENSIVE METABOLIC PANEL
ALT: 30 U/L (ref 0–44)
AST: 36 U/L (ref 15–41)
Albumin: 3.2 g/dL — ABNORMAL LOW (ref 3.5–5.0)
Alkaline Phosphatase: 43 U/L (ref 38–126)
Anion gap: 10 (ref 5–15)
BUN: 24 mg/dL — ABNORMAL HIGH (ref 8–23)
CO2: 27 mmol/L (ref 22–32)
Calcium: 8 mg/dL — ABNORMAL LOW (ref 8.9–10.3)
Chloride: 103 mmol/L (ref 98–111)
Creatinine, Ser: 1.23 mg/dL — ABNORMAL HIGH (ref 0.44–1.00)
GFR, Estimated: 49 mL/min — ABNORMAL LOW (ref >60)
Glucose, Bld: 134 mg/dL — ABNORMAL HIGH (ref 70–99)
Potassium: 4.6 mmol/L (ref 3.5–5.1)
Sodium: 140 mmol/L (ref 135–145)
Total Bilirubin: 1 mg/dL (ref 0.3–1.2)
Total Protein: 6.3 g/dL — ABNORMAL LOW (ref 6.5–8.1)

## 2020-02-11 LAB — MAGNESIUM: Magnesium: 2.2 mg/dL (ref 1.7–2.4)

## 2020-02-11 LAB — FOLATE: Folate: 13 ng/mL (ref >5.9)

## 2020-02-11 LAB — VITAMIN B12: Vitamin B-12: 280 pg/mL (ref 180–914)

## 2020-02-11 LAB — PHOSPHORUS: Phosphorus: 3.5 mg/dL (ref 2.5–4.6)

## 2020-02-11 LAB — CBG MONITORING, ED
Glucose-Capillary: 117 mg/dL — ABNORMAL HIGH (ref 70–99)
Glucose-Capillary: 114 mg/dL — ABNORMAL HIGH (ref 70–99)

## 2020-02-11 LAB — C-REACTIVE PROTEIN: CRP: 14 mg/dL — ABNORMAL HIGH (ref <1.0)

## 2020-02-11 LAB — HIV ANTIBODY (ROUTINE TESTING W REFLEX): HIV Screen 4th Generation wRfx: NONREACTIVE

## 2020-02-11 MED ORDER — IOHEXOL 350 MG/ML SOLN
150.0000 mL | Freq: Once | INTRAVENOUS | Status: AC | PRN
  Administered 2020-02-11: 150 mL via INTRAVENOUS

## 2020-02-11 MED ORDER — ENOXAPARIN SODIUM 100 MG/ML ~~LOC~~ SOLN
90.0000 mg | SUBCUTANEOUS | Status: DC
  Administered 2020-02-12 – 2020-02-14 (×3): 90 mg via SUBCUTANEOUS
  Filled 2020-02-11 (×3): qty 1

## 2020-02-11 MED ORDER — IOHEXOL 350 MG/ML SOLN: 100.0000 mL | Freq: Once | INTRAVENOUS | Status: DC | PRN

## 2020-02-11 NOTE — Evaluation (Signed)
Physical Therapy Evaluation Patient Details Name: Angela [name] MRN: [id number] DOB: 01/21/1956 Today's Date: 02/11/2020   History of Present Illness  Angela [name] is a 64 y.o. female with medical history significant for hypertension, hypothyroidism, hyperlipidemia and obesity who presents to the emergency department due to increasing shortness of breath due to positive Covid status.  Symptoms started on Friday (12/3) with cough (nonproductive), shortness of breath and fatigue.  She took a home Covid test on same day and tested positive, patient has progressively become weaker and have developed increasing work of breathing which worsens on exertion.  She took over-the-counter medications without any relief.  This morning, she noted that her pulse ox was 80%, so she activated EMS.  On arrival of EMS, patient was noted to have an O2 sat of 85% on room air, she was placed on supplemental oxygen via [place] at 3 LPM with improvement of O2 sat to 92%.    Clinical Impression  Patient demonstrates slightly labored movement for sitting up at bedside, slow labored cadence when taking steps, has to lean on nearby objects for support when not using AD, required use of RW for safetly, limited to a few steps in room due to SpO2 dropping from 94% to low 80's % while on 2 LPM and fatigue.  Patient tolerated sitting up in chair after therapy - nursing staff notified.  Patient will benefit from continued physical therapy in hospital and recommended venue below to increase strength, balance, endurance for safe ADLs and gait.     Follow Up Recommendations SNF;Supervision - Intermittent;Supervision for mobility/OOB    Equipment Recommendations  None recommended by PT    Recommendations for Other Services       Precautions / Restrictions Precautions Precautions: Fall Restrictions Weight Bearing Restrictions: No      Mobility  Bed Mobility Overal bed mobility: Needs Assistance Bed Mobility: Supine to Sit      Supine to sit: Supervision;Min guard     General bed mobility comments: increased time, labored movement, HOB raised    Transfers Overall transfer level: Needs assistance Equipment used: Rolling walker (2 wheeled);None Transfers: Sit to/from [employer] to Stand: Min guard;Min assist Stand pivot transfers: Min assist       General transfer comment: increased time, labored movement  Ambulation/Gait Ambulation/Gait assistance: Min guard;Min assist Gait Distance (Feet): 15 Feet Assistive device: Rolling walker (2 wheeled) Gait Pattern/deviations: Decreased step length - right;Decreased step length - left;Decreased stride length Gait velocity: decreased   General Gait Details: slow labored movement without loss of balance, limited mostly due to fatigue  Stairs            Wheelchair Mobility    Modified Rankin (Stroke Patients Only)       Balance Overall balance assessment: Needs assistance Sitting-balance support: No upper extremity supported;Feet supported Sitting balance-Leahy Scale: Good Sitting balance - Comments: seated at EOB   Standing balance support: No upper extremity supported;During functional activity Standing balance-Leahy Scale: Poor Standing balance comment: fair/poor without AD, fair using RW                             Pertinent Vitals/Pain Pain Assessment: No/denies pain    Home Living Family/patient expects to be discharged to:: Private residence Living Arrangements: Children Available Help at Discharge: Family;Available 24 hours/day Type of Home: House Home Access: Stairs to enter Entrance Stairs-Rails: None Entrance Stairs-Number of Steps: 1 Home Layout: Two level  Home Equipment: [place] - 2 wheels;Cane - single point;Bedside commode      Prior Function Level of Independence: Independent with assistive device(s)         Comments: household and short distanced community ambulator using [facility]      Hand Dominance        Extremity/Trunk Assessment   Upper Extremity Assessment Upper Extremity Assessment: Overall WFL for tasks assessed    Lower Extremity Assessment Lower Extremity Assessment: Generalized weakness    Cervical / Trunk Assessment Cervical / Trunk Assessment: Normal  Communication   Communication: No difficulties  Cognition Arousal/Alertness: Awake/alert Behavior During Therapy: WFL for tasks assessed/performed Overall Cognitive Status: Within Functional Limits for tasks assessed                                        General Comments      Exercises     Assessment/Plan    PT Assessment Patient needs continued PT services  PT Problem List Decreased strength;Decreased activity tolerance;Decreased balance;Decreased mobility       PT Treatment Interventions DME instruction;Gait training;Stair training;Functional mobility training;Therapeutic activities;Therapeutic exercise;Balance training;Patient/family education    PT Goals (Current goals can be found in the Care Plan section)  Acute Rehab PT Goals Patient Stated Goal: return home with family to assist PT Goal Formulation: With patient Time For Goal Achievement: 02/25/20 Potential to Achieve Goals: Good    Frequency Min 3X/week   Barriers to discharge        Co-evaluation               AM-PAC PT "6 Clicks" Mobility  Outcome Measure Help needed turning from your back to your side while in a flat bed without using bedrails?: None Help needed moving from lying on your back to sitting on the side of a flat bed without using bedrails?: A Little Help needed moving to and from a bed to a chair (including a wheelchair)?: A Little Help needed standing up from a chair using your arms (e.g., wheelchair or bedside chair)?: A Little Help needed to walk in hospital room?: A Lot Help needed climbing 3-5 steps with a railing? : A Lot 6 Click Score: 17    End of Session  Equipment Utilized During Treatment: Oxygen Activity Tolerance: Patient tolerated treatment well;Patient limited by fatigue Patient left: in chair;with call bell/phone within reach Nurse Communication: Mobility status PT Visit Diagnosis: Unsteadiness on feet (R26.81);Other abnormalities of gait and mobility (R26.89);Muscle weakness (generalized) (M62.81)    Time: 9357-0177 PT Time Calculation (min) (ACUTE ONLY): 36 min   Charges:   PT Evaluation $PT Eval Moderate Complexity: 1 Mod PT Treatments $Therapeutic Activity: 23-37 mins        2:01 PM, 02/11/20 Lonell Grandchild, MPT Physical Therapist with [facility] [phone] office [phone] mobile phone

## 2020-02-11 NOTE — Progress Notes (Signed)
PROGRESS NOTE  JACKILYN UMPHLETT KLK:[id number] DOB: 09-28-1955 DOA: 02/10/2020 PCP: Sharilyn Sites, MD  Brief History:  64 year old female with a history of hypothyroidism, hyperlipidemia, elevated blood pressure presenting with 3-day history of progressive coughing, shortness of breath, dyspnea on exertion, and generalized weakness.  The patient began having symptoms on 02/07/2020 and obtained a home COVID-19 test which was positive.  Since that day, her symptoms have progressed.  She checked her oxygen saturation at home and it was 80% on room air.  EMS was activated.  EMS noted the patient have oxygen saturation 85% room air.  She was placed on 3 L with saturation up to 92%.  The patient had some subjective fevers and chills.  She denied any headache, chest pain, hemoptysis, nausea, vomiting, diarrhea, abdominal pain.  She did have some loose stools.  There is no dysuria or hematuria. In the emergency department, the patient was febrile up to 101.1 F.  She was hemodynamically stable.  Oxygen saturation was 94% on 3.5 L.  The patient was started on remdesivir and IV steroids.  Assessment/Plan: Acute respiratory failure with hypoxia secondary COVID-19 -Currently stable on 2 L nasal cannula with saturation 94-95% -Continue remdesivir and steroids -CRP 11.7>> 14.0 -D-dimer 3.30>> 3.43 -Ferritin 190>> -PCT 0.12 -Lactic acid 0.7 -Personally reviewed chest x-ray--bilateral patchy infiltrates, recurrent left  CKD stage IIIa -Baseline creatinine 0.9-1.2 -A.m. BMP  Morbid obesity -BMI 57.5 -Lifestyle modification  Hypothyroidism -Continue levothyroxine  Hyperlipidemia -Continue simvastatin  Leg edema/pain -Venous duplex legs  Hyperglycemia -check A1C  Thrombocytopenia -likely due to viral infection -check B12 -check folate   Status is: Inpatient  Remains inpatient appropriate because:IV treatments appropriate due to intensity of illness or inability to take  PO   Dispo: The patient is from: Home              Anticipated d/c is to: Home              Anticipated d/c date is: 2 days              Patient currently is not medically stable to d/c.        Family Communication:  No  Family at bedside  Consultants:  none  Code Status:  FULL   DVT Prophylaxis:   [place] Lovenox   Procedures: As Listed in Progress Note Above  Antibiotics: None       Subjective: Patient feels that her breathing is low but better as well as her coughing.  She denies hemoptysis, vomiting, diarrhea, abdominal pain, dysuria, hematuria per there is no headache or neck pain.  Objective: Vitals:   02/11/20 0300 02/11/20 0330 02/11/20 0400 02/11/20 0500  BP: 136/72 (!) 146/79 133/67 139/75  Pulse: (!) 52 67 (!) 54 65  Resp: (!) 21 13 (!) 22 (!) 22  Temp:      TempSrc:      SpO2: 90% 93% 91% 94%  Weight:      Height:        Intake/Output Summary (Last 24 hours) at 02/11/2020 9794 Last data filed at 02/11/2020 0314 Gross per 24 hour  Intake --  Output 400 ml  Net -400 ml   Weight change:  Exam:   General:  Pt is alert, follows commands appropriately, not in acute distress  HEENT: No icterus, No thrush, No neck mass, [place]/AT  Cardiovascular: RRR, S1/S2, no rubs, no gallops  Respiratory: [place] crackles but no wheezing.  Good  air movement  Abdomen: Soft/+BS, non tender, non distended, no guarding  Extremities: 1+LE edema, No lymphangitis, No petechiae, No rashes, no synovitis   Data Reviewed: I have personally reviewed following labs and imaging studies Basic Metabolic Panel: Recent Labs  Lab 02/10/20 2030 02/11/20 0320  NA 137 140  K 4.1 4.6  CL 102 103  CO2 27 27  GLUCOSE 108* 134*  BUN 25* 24*  CREATININE 1.37* 1.23*  CALCIUM 8.0* 8.0*  MG  --  2.2  PHOS  --  3.5   Liver Function Tests: Recent Labs  Lab 02/10/20 2030 02/11/20 0320  AST 38 36  ALT 30 30  ALKPHOS 42 43  BILITOT 0.7 1.0  PROT 5.9* 6.3*  ALBUMIN 3.2*  3.2*   No results for input(s): LIPASE, AMYLASE in the last 168 hours. No results for input(s): AMMONIA in the last 168 hours. Coagulation Profile: No results for input(s): INR, PROTIME in the last 168 hours. CBC: Recent Labs  Lab 02/10/20 2030 02/11/20 0320  WBC 4.8 3.4*  NEUTROABS  --  2.6  HGB 12.3 12.7  HCT 40.1 41.4  MCV 90.9 89.8  PLT 146* 138*   Cardiac Enzymes: No results for input(s): CKTOTAL, CKMB, CKMBINDEX, TROPONINI in the last 168 hours. BNP: Invalid input(s): POCBNP CBG: Recent Labs  Lab 02/10/20 2328  GLUCAP 114*   HbA1C: No results for input(s): HGBA1C in the last 72 hours. Urine analysis: No results found for: COLORURINE, APPEARANCEUR, LABSPEC, PHURINE, GLUCOSEU, HGBUR, BILIRUBINUR, KETONESUR, PROTEINUR, UROBILINOGEN, NITRITE, LEUKOCYTESUR Sepsis Labs: @LABRCNTIP (procalcitonin:4,lacticidven:4) ) Recent Results (from the past 240 hour(s))  Resp Panel by RT-PCR (Flu A&B, Covid) Nasopharyngeal Swab     Status: Abnormal   Collection Time: 02/10/20  3:35 PM   Specimen: Nasopharyngeal Swab; Nasopharyngeal(NP) swabs in vial transport medium  Result Value Ref Range Status   SARS Coronavirus 2 by RT PCR POSITIVE (A) NEGATIVE Final    Comment: CRITICAL RESULT CALLED TO, READ BACK BY AND VERIFIED WITH: NICKALS,K AT 1937 ON 12.6.21 BY ISLEY,B (NOTE) SARS-CoV-2 target nucleic acids are DETECTED.  The SARS-CoV-2 RNA is generally detectable in upper respiratory specimens during the acute phase of infection. Positive results are indicative of the presence of the identified virus, but do not rule out bacterial infection or co-infection with other pathogens not detected by the test. Clinical correlation with patient history and other diagnostic information is necessary to determine patient infection status. The expected result is Negative.  Fact Sheet for Patients: EntrepreneurPulse.com.au  Fact Sheet for Healthcare  Providers: IncredibleEmployment.be  This test is not yet approved or cleared by the [place] FDA and  has been authorized for detection and/or diagnosis of SARS-CoV-2 by FDA under an Emergency Use Authorization (EUA).  This EUA will remain in effect (meaning t his test can be used) for the duration of  the COVID-19 declaration under Section 564(b)(1) of the Act, 21 U.S.C. section 360bbb-3(b)(1), unless the authorization is terminated or revoked sooner.     Influenza A by PCR NEGATIVE NEGATIVE Final   Influenza B by PCR NEGATIVE NEGATIVE Final    Comment: (NOTE) The Xpert Xpress SARS-CoV-2/FLU/RSV plus assay is intended as an aid in the diagnosis of influenza from Nasopharyngeal swab specimens and should not be used as a sole basis for treatment. Nasal washings and aspirates are unacceptable for Xpert Xpress SARS-CoV-2/FLU/RSV testing.  Fact Sheet for Patients: EntrepreneurPulse.com.au  Fact Sheet for Healthcare Providers: IncredibleEmployment.be  This test is not yet approved or cleared by the [place]  FDA and has been authorized for detection and/or diagnosis of SARS-CoV-2 by FDA under an Emergency Use Authorization (EUA). This EUA will remain in effect (meaning this test can be used) for the duration of the COVID-19 declaration under Section 564(b)(1) of the Act, 21 U.S.C. section 360bbb-3(b)(1), unless the authorization is terminated or revoked.  Performed at [facility], [address], [place], [place] [address]   Blood Culture (routine x 2)     Status: None (Preliminary result)   Collection Time: 02/10/20  8:19 PM   Specimen: BLOOD LEFT ARM  Result Value Ref Range Status   Specimen Description BLOOD LEFT ARM  Final   Special Requests   Final    BOTTLES DRAWN AEROBIC AND ANAEROBIC Blood Culture adequate volume Performed at [facility], [address], [place], [place] [address]    Culture PENDING   Incomplete   Report Status PENDING  Incomplete  Blood Culture (routine x 2)     Status: None (Preliminary result)   Collection Time: 02/10/20  8:30 PM   Specimen: BLOOD LEFT HAND  Result Value Ref Range Status   Specimen Description BLOOD LEFT HAND  Final   Special Requests   Final    BOTTLES DRAWN AEROBIC AND ANAEROBIC Blood Culture adequate volume Performed at [facility], [address], [place], [place] [address]    Culture PENDING  Incomplete   Report Status PENDING  Incomplete     Scheduled Meds: . albuterol  2 puff Inhalation Q6H  . cholecalciferol  1,000 Units Oral Daily  . dextromethorphan-guaiFENesin  1 tablet Oral BID  . enoxaparin (LOVENOX) injection  80 mg Subcutaneous Q24H  . feeding supplement  237 mL Oral BID BM  . insulin aspart  0-20 Units Subcutaneous TID WC  . insulin aspart  0-5 Units Subcutaneous QHS  . levothyroxine  125 mcg Oral Daily  . methylPREDNISolone (SOLU-MEDROL) injection  0.5 mg/kg Intravenous Q12H   Followed by  . [START ON 02/14/2020] predniSONE  50 mg Oral Daily  . nystatin   Topical BID  . pantoprazole (PROTONIX) IV  40 mg Intravenous QHS  . simvastatin  20 mg Oral QHS   Continuous Infusions: . remdesivir 100 mg in NS 100 mL      Procedures/Studies: DG Chest Portable 1 View  Result Date: 02/10/2020 CLINICAL DATA:  COVID positive with shortness of breath. EXAM: PORTABLE CHEST 1 VIEW COMPARISON:  August 18, 2005 FINDINGS: Mild multifocal infiltrates are seen throughout both lungs. There is no evidence of a pleural effusion or pneumothorax. The cardiac silhouette is mildly enlarged. The visualized skeletal structures are unremarkable. IMPRESSION: Mild bilateral multifocal infiltrates. Electronically Signed   By: Virgina Norfolk M.D.   On: 02/10/2020 16:05    Orson Eva, DO  [facility]  If 7PM-7AM, please contact night-coverage [email or web address] Password [email or web address] 02/11/2020, 7:33 AM   LOS: 1 day

## 2020-02-11 NOTE — Plan of Care (Signed)
  Problem: Acute Rehab PT Goals(only PT should resolve) Goal: Pt Will Go Supine/Side To Sit Outcome: Progressing Flowsheets (Taken 02/11/2020 1403) Pt will go Supine/Side to Sit: with modified independence Goal: Patient Will Transfer Sit To/From Stand Outcome: Progressing Flowsheets (Taken 02/11/2020 1403) Patient will transfer sit to/from stand: with supervision Goal: Pt Will Transfer Bed To Chair/Chair To Bed Outcome: Progressing Flowsheets (Taken 02/11/2020 1403) Pt will Transfer Bed to Chair/Chair to Bed: with supervision Goal: Pt Will Ambulate Outcome: Progressing Flowsheets (Taken 02/11/2020 1403) Pt will Ambulate:  50 feet  with supervision  with least restrictive assistive device   2:03 PM, 02/11/20 Lonell Grandchild, MPT Physical Therapist with [facility] 336 [phone] office [phone] mobile phone

## 2020-02-11 NOTE — ED Notes (Signed)
Bed linens changed, pt placed in clean gown by ED techs. Fresh water given. No other needs at this time.

## 2020-02-12 DIAGNOSIS — M79605 Pain in left leg: Secondary | ICD-10-CM

## 2020-02-12 DIAGNOSIS — R7303 Prediabetes: Secondary | ICD-10-CM

## 2020-02-12 DIAGNOSIS — M79604 Pain in right leg: Secondary | ICD-10-CM

## 2020-02-12 DIAGNOSIS — N1831 Chronic kidney disease, stage 3a: Secondary | ICD-10-CM

## 2020-02-12 LAB — CBC WITH DIFFERENTIAL/PLATELET
Abs Immature Granulocytes: 0.04 10*3/uL (ref 0.00–0.07)
Basophils Absolute: 0 10*3/uL (ref 0.0–0.1)
Basophils Relative: 0 %
Eosinophils Absolute: 0 10*3/uL (ref 0.0–0.5)
Eosinophils Relative: 0 %
HCT: 42.5 % (ref 36.0–46.0)
Hemoglobin: 13.2 g/dL (ref 12.0–15.0)
Immature Granulocytes: 1 %
Lymphocytes Relative: 6 %
Lymphs Abs: 0.5 10*3/uL — ABNORMAL LOW (ref 0.7–4.0)
MCH: 27.6 pg (ref 26.0–34.0)
MCHC: 31.1 g/dL (ref 30.0–36.0)
MCV: 88.9 fL (ref 80.0–100.0)
Monocytes Absolute: 0.6 10*3/uL (ref 0.1–1.0)
Monocytes Relative: 7 %
Neutro Abs: 7 10*3/uL (ref 1.7–7.7)
Neutrophils Relative %: 86 %
Platelets: 176 10*3/uL (ref 150–400)
RBC: 4.78 MIL/uL (ref 3.87–5.11)
RDW: 14.2 % (ref 11.5–15.5)
WBC: 8.1 10*3/uL (ref 4.0–10.5)
nRBC: 0 % (ref 0.0–0.2)

## 2020-02-12 LAB — FERRITIN: Ferritin: 315 ng/mL — ABNORMAL HIGH (ref 11–307)

## 2020-02-12 LAB — COMPREHENSIVE METABOLIC PANEL
ALT: 27 U/L (ref 0–44)
AST: 34 U/L (ref 15–41)
Albumin: 3.1 g/dL — ABNORMAL LOW (ref 3.5–5.0)
Alkaline Phosphatase: 43 U/L (ref 38–126)
Anion gap: 11 (ref 5–15)
BUN: 18 mg/dL (ref 8–23)
CO2: 26 mmol/L (ref 22–32)
Calcium: 8.2 mg/dL — ABNORMAL LOW (ref 8.9–10.3)
Chloride: 103 mmol/L (ref 98–111)
Creatinine, Ser: 0.97 mg/dL (ref 0.44–1.00)
GFR, Estimated: >60 mL/min (ref >60)
Glucose, Bld: 180 mg/dL — ABNORMAL HIGH (ref 70–99)
Potassium: 3.8 mmol/L (ref 3.5–5.1)
Sodium: 140 mmol/L (ref 135–145)
Total Bilirubin: 0.8 mg/dL (ref 0.3–1.2)
Total Protein: 6.4 g/dL — ABNORMAL LOW (ref 6.5–8.1)

## 2020-02-12 LAB — HEMOGLOBIN A1C
Hgb A1c MFr Bld: 6 % — ABNORMAL HIGH (ref 4.8–5.6)
Mean Plasma Glucose: 125.5 mg/dL

## 2020-02-12 LAB — C-REACTIVE PROTEIN: CRP: 12.9 mg/dL — ABNORMAL HIGH (ref <1.0)

## 2020-02-12 LAB — PHOSPHORUS: Phosphorus: 3.2 mg/dL (ref 2.5–4.6)

## 2020-02-12 LAB — GLUCOSE, CAPILLARY
Glucose-Capillary: 116 mg/dL — ABNORMAL HIGH (ref 70–99)
Glucose-Capillary: 158 mg/dL — ABNORMAL HIGH (ref 70–99)
Glucose-Capillary: 193 mg/dL — ABNORMAL HIGH (ref 70–99)
Glucose-Capillary: 141 mg/dL — ABNORMAL HIGH (ref 70–99)
Glucose-Capillary: 145 mg/dL — ABNORMAL HIGH (ref 70–99)

## 2020-02-12 LAB — MAGNESIUM: Magnesium: 2.1 mg/dL (ref 1.7–2.4)

## 2020-02-12 LAB — BRAIN NATRIURETIC PEPTIDE: B Natriuretic Peptide: 337 pg/mL — ABNORMAL HIGH (ref 0.0–100.0)

## 2020-02-12 LAB — D-DIMER, QUANTITATIVE: D-Dimer, Quant: 3.33 ug/mL-FEU — ABNORMAL HIGH (ref 0.00–0.50)

## 2020-02-12 NOTE — Progress Notes (Signed)
PROGRESS NOTE  Angela [name] WGN:[id number] DOB: 07-08-1955 DOA: 02/10/2020 PCP: Sharilyn Sites, MD  Brief History:  64 year old female with a history of hypothyroidism, hyperlipidemia, elevated blood pressure presenting with 3-day history of progressive coughing, shortness of breath, dyspnea on exertion, and generalized weakness.  The patient began having symptoms on 02/07/2020 and obtained a home COVID-19 test which was positive.  Since that day, her symptoms have progressed.  She checked her oxygen saturation at home and it was 80% on room air.  EMS was activated.  EMS noted the patient have oxygen saturation 85% room air.  She was placed on 3 L with saturation up to 92%.  The patient had some subjective fevers and chills.  She denied any headache, chest pain, hemoptysis, nausea, vomiting, diarrhea, abdominal pain.  She did have some loose stools.  There is no dysuria or hematuria. In the emergency department, the patient was febrile up to 101.1 F.  She was hemodynamically stable.  Oxygen saturation was 94% on 3.5 L.  The patient was started on remdesivir and IV steroids.  Assessment/Plan: Acute respiratory failure with hypoxia secondary COVID-19 -Currently stable on 2.5--3 L nasal cannula with saturation 92-94% -Continue remdesivir and steroids.  Day 3 out of 5. -Continue to follow rheumatology markers. -CRP 11.7>> 14.0 -D-dimer 3.30>> 3.43 -Ferritin 190>> -PCT 0.12 -Lactic acid 0.7 -Chest x-ray demonstrating bilateral patchy infiltrates; no pneumothorax.  CKD stage IIIa -Baseline creatinine 0.9-1.2 -Continue to follow renal function trend, avoid nephrotoxic agents and maintain adequate hydration.  Morbid obesity -BMI 57.5 -Low calorie diet, portion control and increase physical activity discussed with patient. -Patient will benefit of sleep study as an outpatient.  Hypothyroidism -Continue levothyroxine  Hyperlipidemia -Continue simvastatin -Heart healthy diet has  been encouraged.  Leg edema/pain -Venous duplex legs negative for DVT -Patient chronic lower extremity edema most likely secondary to obesity. -Low-sodium diet and weight loss emphasized.  Hyperglycemia without prior history of diabetes -Most likely in the setting of steroid usage -Checked A1C 6.0 -Patient is meeting criteria for prediabetes -Continue modified carbohydrate diet.  Thrombocytopenia -likely due to viral infection -Continue to follow platelets count -No signs of overt bleeding.   Status is: Inpatient   Dispo: The patient is from: Home              Anticipated d/c is to: Home chest.              Anticipated d/c date is: 2 days              Patient currently is not medically stable to d/c.  Still requiring oxygen supplementation, feeling weak and deconditioned.  Course in the 3 out of 5 of remdesivir infusions.  Patient at baseline independent with activities of daily living and uses a cane for ambulation.  Physical therapy has seen patient and has recommended skilled nursing facility; patient declined SNF and is agreeable with home health PT.   Family Communication:  No  Family at bedside; patient reported that she will update family over the phone by herself.  Consultants:  none  Code Status:  FULL   DVT Prophylaxis:   [place] Lovenox   Procedures: As Listed in Progress Note Above  Antibiotics: None   Subjective: Patient reports breathing is improving; continue to have intermittent coughing spells, but better.  No nausea, no vomiting, no chest pain.  Feeling weak and tired.  Objective: Vitals:   02/12/20 7846 02/12/20 9629 02/12/20 5284  02/12/20 1506  BP: (!) 147/103 (!) 144/80  (!) 135/95  Pulse: 100 83  (!) 59  Resp:  18  16  Temp: (!) 97.5 F (36.4 C) (!) 97.5 F (36.4 C)  (!) 97.5 F (36.4 C)  TempSrc: Oral Oral  Oral  SpO2: 92% 96% 93% 92%  Weight:      Height:        Intake/Output Summary (Last 24 hours) at 02/12/2020 1740 Last data filed  at 02/12/2020 1207 Gross per 24 hour  Intake 120 ml  Output 600 ml  Net -480 ml   Weight change:   Exam: General exam: Alert, awake, oriented x 3; in no acute distress; reports improvement in her breathing.  Good saturation On 2.5-3 L nasal cannula supplementation. Respiratory system: Improved air movement bilaterally; no using accessory muscles.  Positive scattered rhonchi. Cardiovascular system:RRR. No murmurs, rubs, gallops.  Unable to assess JVD with body habitus. Gastrointestinal system: Abdomen is obese, nondistended, soft and nontender. No organomegaly or masses felt. Normal bowel sounds heard. Central nervous system: Alert and oriented. No focal neurological deficits. Extremities: No cyanosis or clubbing; chronic stasis dermatitis appreciated on both legs.  Trace to 1+ edema bilaterally (not new). Skin: No petechiae. Psychiatry: Judgement and insight appear normal. Mood & affect appropriate.    Data Reviewed: I have personally reviewed following labs and imaging studies  Basic Metabolic Panel: Recent Labs  Lab 02/10/20 2030 02/11/20 0320 02/12/20 0439  NA 137 140 140  K 4.1 4.6 3.8  CL 102 103 103  CO2 27 27 26   GLUCOSE 108* 134* 180*  BUN 25* 24* 18  CREATININE 1.37* 1.23* 0.97  CALCIUM 8.0* 8.0* 8.2*  MG  --  2.2 2.1  PHOS  --  3.5 3.2   Liver Function Tests: Recent Labs  Lab 02/10/20 2030 02/11/20 0320 02/12/20 0439  AST 38 36 34  ALT 30 30 27   ALKPHOS 42 43 43  BILITOT 0.7 1.0 0.8  PROT 5.9* 6.3* 6.4*  ALBUMIN 3.2* 3.2* 3.1*   CBC: Recent Labs  Lab 02/10/20 2030 02/11/20 0320 02/12/20 0439  WBC 4.8 3.4* 8.1  NEUTROABS  --  2.6 7.0  HGB 12.3 12.7 13.2  HCT 40.1 41.4 42.5  MCV 90.9 89.8 88.9  PLT 146* 138* 176   CBG: Recent Labs  Lab 02/11/20 0754 02/11/20 1417 02/11/20 2122 02/12/20 0742 02/12/20 1130  GLUCAP 117* 114* 116* 158* 193*   HbA1C: Recent Labs    02/12/20 0439  HGBA1C 6.0*    Recent Results (from the past 240  hour(s))  Resp Panel by RT-PCR (Flu A&B, Covid) Nasopharyngeal Swab     Status: Abnormal   Collection Time: 02/10/20  3:35 PM   Specimen: Nasopharyngeal Swab; Nasopharyngeal(NP) swabs in vial transport medium  Result Value Ref Range Status   SARS Coronavirus 2 by RT PCR POSITIVE (A) NEGATIVE Final    Comment: CRITICAL RESULT CALLED TO, READ BACK BY AND VERIFIED WITH: NICKALS,K AT 1937 ON 12.6.21 BY ISLEY,B (NOTE) SARS-CoV-2 target nucleic acids are DETECTED.  The SARS-CoV-2 RNA is generally detectable in upper respiratory specimens during the acute phase of infection. Positive results are indicative of the presence of the identified virus, but do not rule out bacterial infection or co-infection with other pathogens not detected by the test. Clinical correlation with patient history and other diagnostic information is necessary to determine patient infection status. The expected result is Negative.  Fact Sheet for Patients: EntrepreneurPulse.com.au  Fact Sheet for Healthcare Providers:  IncredibleEmployment.be  This test is not yet approved or cleared by the [place] and  has been authorized for detection and/or diagnosis of SARS-CoV-2 by FDA under an Emergency Use Authorization (EUA).  This EUA will remain in effect (meaning t his test can be used) for the duration of  the COVID-19 declaration under Section 564(b)(1) of the Act, 21 U.S.C. section 360bbb-3(b)(1), unless the authorization is terminated or revoked sooner.     Influenza A by PCR NEGATIVE NEGATIVE Final   Influenza B by PCR NEGATIVE NEGATIVE Final    Comment: (NOTE) The Xpert Xpress SARS-CoV-2/FLU/RSV plus assay is intended as an aid in the diagnosis of influenza from Nasopharyngeal swab specimens and should not be used as a sole basis for treatment. Nasal washings and aspirates are unacceptable for Xpert Xpress SARS-CoV-2/FLU/RSV testing.  Fact Sheet for  Patients: EntrepreneurPulse.com.au  Fact Sheet for Healthcare Providers: IncredibleEmployment.be  This test is not yet approved or cleared by the [place] FDA and has been authorized for detection and/or diagnosis of SARS-CoV-2 by FDA under an Emergency Use Authorization (EUA). This EUA will remain in effect (meaning this test can be used) for the duration of the COVID-19 declaration under Section 564(b)(1) of the Act, 21 U.S.C. section 360bbb-3(b)(1), unless the authorization is terminated or revoked.  Performed at [facility], [address] [place], [place] [address]   Blood Culture (routine x 2)     Status: None (Preliminary result)   Collection Time: 02/10/20  8:19 PM   Specimen: BLOOD LEFT ARM  Result Value Ref Range Status   Specimen Description BLOOD LEFT ARM  Final   Special Requests   Final    BOTTLES DRAWN AEROBIC AND ANAEROBIC Blood Culture adequate volume Performed at [facility], [address], [place], [place] [address]    Culture PENDING  Incomplete   Report Status PENDING  Incomplete  Blood Culture (routine x 2)     Status: None (Preliminary result)   Collection Time: 02/10/20  8:30 PM   Specimen: BLOOD LEFT HAND  Result Value Ref Range Status   Specimen Description BLOOD LEFT HAND  Final   Special Requests   Final    BOTTLES DRAWN AEROBIC AND ANAEROBIC Blood Culture adequate volume Performed at [facility], [address], [place],  [address]    Culture PENDING  Incomplete   Report Status PENDING  Incomplete     Scheduled Meds: . albuterol  2 puff Inhalation Q6H  . cholecalciferol  1,000 Units Oral Daily  . dextromethorphan-guaiFENesin  1 tablet Oral BID  . enoxaparin (LOVENOX) injection  90 mg Subcutaneous Q24H  . feeding supplement  237 mL Oral BID BM  . insulin aspart  0-20 Units Subcutaneous TID WC  . insulin aspart  0-5 Units Subcutaneous QHS  . levothyroxine  125 mcg Oral Daily  .  methylPREDNISolone (SOLU-MEDROL) injection  0.5 mg/kg Intravenous Q12H   Followed by  . [START ON 02/14/2020] predniSONE  50 mg Oral Daily  . nystatin   Topical BID  . pantoprazole (PROTONIX) IV  40 mg Intravenous QHS  . simvastatin  20 mg Oral QHS   Continuous Infusions: . remdesivir 100 mg in NS 100 mL 100 mg (02/12/20 1009)    Procedures/Studies: CT ANGIO CHEST PE W OR WO CONTRAST  Result Date: 02/11/2020 CLINICAL DATA:  Dyspnea, COVID-19 positive. EXAM: CT ANGIOGRAPHY CHEST WITH CONTRAST TECHNIQUE: Multidetector CT imaging of the chest was performed using the standard protocol during bolus administration of intravenous contrast.  Multiplanar CT image reconstructions and MIPs were obtained to evaluate the vascular anatomy. CONTRAST:  134mL OMNIPAQUE IOHEXOL 350 MG/ML SOLN COMPARISON:  None. FINDINGS: Cardiovascular: Satisfactory opacification of the pulmonary arteries to the segmental level. No evidence of pulmonary embolism. Normal heart size. No pericardial effusion. Mediastinum/Nodes: No enlarged mediastinal, hilar, or axillary lymph nodes. Thyroid gland, trachea, and esophagus demonstrate no significant findings. Lungs/Pleura: No pneumothorax is noted. Multiple patchy airspace opacities are noted bilaterally consistent with multifocal pneumonia. Minimal bilateral pleural effusions are noted with adjacent subsegmental atelectasis. Upper Abdomen: No acute abnormality. Musculoskeletal: No chest wall abnormality. No acute or significant osseous findings. Review of the MIP images confirms the above findings. IMPRESSION: 1. No definite evidence of pulmonary embolus. 2. Multiple patchy airspace opacities are noted bilaterally consistent with multifocal pneumonia. 3. Minimal bilateral pleural effusions are noted with adjacent subsegmental atelectasis. Electronically Signed   By: Marijo Conception M.D.   On: 02/11/2020 10:28   US Venous Img Lower Bilateral (DVT)  Result Date: 02/11/2020 CLINICAL DATA:   Bilateral lower extremity edema EXAM: BILATERAL LOWER EXTREMITY VENOUS DOPPLER ULTRASOUND TECHNIQUE: Gray-scale sonography with graded compression, as well as color Doppler and duplex ultrasound were performed to evaluate the lower extremity deep venous systems from the level of the common femoral vein and including the common femoral, femoral, profunda femoral, popliteal and calf veins including the posterior tibial, peroneal and gastrocnemius veins when visible. The superficial great saphenous vein was also interrogated. Spectral Doppler was utilized to evaluate flow at rest and with distal augmentation maneuvers in the common femoral, femoral and popliteal veins. COMPARISON:  None. FINDINGS: RIGHT LOWER EXTREMITY Common Femoral Vein: No evidence of thrombus. Normal compressibility, respiratory phasicity and response to augmentation. Saphenofemoral Junction: No evidence of thrombus. Normal compressibility and flow on color Doppler imaging. Profunda Femoral Vein: No evidence of thrombus. Normal compressibility and flow on color Doppler imaging. Femoral Vein: No evidence of thrombus. Normal compressibility, respiratory phasicity and response to augmentation. Popliteal Vein: No evidence of thrombus. Normal compressibility, respiratory phasicity and response to augmentation. Calf Veins: No evidence of thrombus. Normal compressibility and flow on color Doppler imaging. Superficial Great Saphenous Vein: No evidence of thrombus. Normal compressibility. Venous Reflux:  None. Other Findings:  None. LEFT LOWER EXTREMITY Common Femoral Vein: No evidence of thrombus. Normal compressibility, respiratory phasicity and response to augmentation. Saphenofemoral Junction: No evidence of thrombus. Normal compressibility and flow on color Doppler imaging. Profunda Femoral Vein: No evidence of thrombus. Normal compressibility and flow on color Doppler imaging. Femoral Vein: No evidence of thrombus. Normal compressibility, respiratory  phasicity and response to augmentation. Popliteal Vein: No evidence of thrombus. Normal compressibility, respiratory phasicity and response to augmentation. Calf Veins: No evidence of thrombus. Normal compressibility and flow on color Doppler imaging. Superficial Great Saphenous Vein: No evidence of thrombus. Normal compressibility. Venous Reflux:  None. Other Findings:  None. IMPRESSION: No evidence of deep venous thrombosis in either lower extremity. Electronically Signed   By: Jacqulynn Cadet M.D.   On: 02/11/2020 14:34   DG Chest Portable 1 View  Result Date: 02/10/2020 CLINICAL DATA:  COVID positive with shortness of breath. EXAM: PORTABLE CHEST 1 VIEW COMPARISON:  August 18, 2005 FINDINGS: Mild multifocal infiltrates are seen throughout both lungs. There is no evidence of a pleural effusion or pneumothorax. The cardiac silhouette is mildly enlarged. The visualized skeletal structures are unremarkable. IMPRESSION: Mild bilateral multifocal infiltrates. Electronically Signed   By: Virgina Norfolk M.D.   On: 02/10/2020 16:05  Barton Dubois, MD  [facility]  02/12/2020, 5:40 PM   LOS: 2 days

## 2020-02-12 NOTE — Plan of Care (Signed)
  Problem: Education: Goal: Knowledge of risk factors and measures for prevention of condition will improve Outcome: Progressing   Problem: Coping: Goal: Psychosocial and spiritual needs will be supported Outcome: Progressing   Problem: Respiratory: Goal: Will maintain a patent airway Outcome: Progressing Goal: Complications related to the disease process, condition or treatment will be avoided or minimized Outcome: Progressing   

## 2020-02-12 NOTE — TOC Initial Note (Signed)
Transition of Care [facility]) - Initial/Assessment Note    Patient Details  Name: Angela [name] MRN: [id number] Date of Birth: 1955/06/02  Transition of Care [facility]) CM/SW Contact:    Ihor Gully, LCSW Phone Number: 02/12/2020, 11:42 AM  Clinical Narrative:                 Patient from home with grandson. Admitted COVID+. At baseline she ambulates with a cane, independent with ADLs. Does not drive, grandson takes to appointments. PT recommendation for SNF discussed. Patient declines SNF, agreeable to HHPT. [place] agencies discussed. [facility] is accepting of patient's insurance, referral made.   Expected Discharge Plan: [place] Barriers to Discharge: Continued Medical Work up   Patient Goals and CMS Choice Patient states their goals for this hospitalization and ongoing recovery are:: patient wants to rehab then return home.      Expected Discharge Plan and Services Expected Discharge Plan: [place] arrangements for the past 2 months: Single Family Home                           HH Arranged: PT HH Agency: [place] Date [place]: 02/12/20 Time HH Agency Contacted: 56 Representative spoke with at [place]: Tommi Rumps  Prior Living Arrangements/Services Living arrangements for the past 2 months: [place] Lives with:: Relatives Biomedical scientist) Patient language and need for interpreter reviewed:: Yes Do you feel safe going back to the place where you live?: Yes      Need for Family Participation in Patient Care: Yes (Comment) Care giver support system in place?: Yes (comment) Current home services: DME [facility] Knudsen) Criminal Activity/Legal Involvement Pertinent to Current Situation/Hospitalization: No - Comment as needed  Activities of Daily Living Home Assistive Devices/Equipment: Cane (specify quad or straight) ADL Screening (condition at time of admission) Patient's cognitive ability adequate to  safely complete daily activities?: Yes Is the patient deaf or have difficulty hearing?: No Does the patient have difficulty seeing, even when wearing glasses/contacts?: No Does the patient have difficulty concentrating, remembering, or making decisions?: No Patient able to express need for assistance with ADLs?: Yes Does the patient have difficulty dressing or bathing?: No Independently performs ADLs?: Yes (appropriate for developmental age) Does the patient have difficulty walking or climbing stairs?: No Weakness of Legs: Both Weakness of Arms/Hands: None  Permission Sought/Granted                  Emotional Assessment     Affect (typically observed): Appropriate Orientation: : Oriented to Self, Oriented to Place, Oriented to  Time, Oriented to Situation Alcohol / Substance Use: Not Applicable Psych Involvement: No (comment)  Admission diagnosis:  Leg pain [M79.606] Pneumonia due to COVID-19 virus [U07.1, J12.82] Patient Active Problem List   Diagnosis Date Noted  . Pneumonia due to COVID-19 virus 02/10/2020  . Thrombocytopenia ([place]) 02/10/2020  . Hypoalbuminemia 02/10/2020  . Elevated d-dimer 02/10/2020  . AKI (acute kidney injury) ([place]) 02/10/2020  . Acute respiratory failure with hypoxia ([place]) 02/10/2020  . [place] intertrigo 02/10/2020  . Hyperlipidemia 06/06/2016  . 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  . Undiagnosed cardiac murmurs 06/06/2016  . Hypothyroid 06/06/2016  . Atrial fibrillation ([place]) 06/06/2016  . Morbid obesity ([place]) 06/06/2016  . Chronic lymphocytic thyroiditis 06/06/2016   PCP:  Sharilyn Sites, MD Pharmacy:   [facility], [place]  [place] [address] Phone: [phone] Fax: [phone]     Social Determinants of Health (SDOH) Interventions    Readmission Risk Interventions No flowsheet data found.

## 2020-02-13 LAB — CBC WITH DIFFERENTIAL/PLATELET
Abs Immature Granulocytes: 0.04 10*3/uL (ref 0.00–0.07)
Basophils Absolute: 0 10*3/uL (ref 0.0–0.1)
Basophils Relative: 0 %
Eosinophils Absolute: 0 10*3/uL (ref 0.0–0.5)
Eosinophils Relative: 0 %
HCT: 43.4 % (ref 36.0–46.0)
Hemoglobin: 13.4 g/dL (ref 12.0–15.0)
Immature Granulocytes: 1 %
Lymphocytes Relative: 7 %
Lymphs Abs: 0.5 10*3/uL — ABNORMAL LOW (ref 0.7–4.0)
MCH: 27.5 pg (ref 26.0–34.0)
MCHC: 30.9 g/dL (ref 30.0–36.0)
MCV: 89.1 fL (ref 80.0–100.0)
Monocytes Absolute: 0.7 10*3/uL (ref 0.1–1.0)
Monocytes Relative: 10 %
Neutro Abs: 5.8 10*3/uL (ref 1.7–7.7)
Neutrophils Relative %: 82 %
Platelets: 195 10*3/uL (ref 150–400)
RBC: 4.87 MIL/uL (ref 3.87–5.11)
RDW: 14.3 % (ref 11.5–15.5)
WBC: 7 10*3/uL (ref 4.0–10.5)
nRBC: 0 % (ref 0.0–0.2)

## 2020-02-13 LAB — COMPREHENSIVE METABOLIC PANEL
ALT: 24 U/L (ref 0–44)
AST: 25 U/L (ref 15–41)
Albumin: 2.9 g/dL — ABNORMAL LOW (ref 3.5–5.0)
Alkaline Phosphatase: 42 U/L (ref 38–126)
Anion gap: 7 (ref 5–15)
BUN: 17 mg/dL (ref 8–23)
CO2: 28 mmol/L (ref 22–32)
Calcium: 8.2 mg/dL — ABNORMAL LOW (ref 8.9–10.3)
Chloride: 104 mmol/L (ref 98–111)
Creatinine, Ser: 0.83 mg/dL (ref 0.44–1.00)
GFR, Estimated: >60 mL/min (ref >60)
Glucose, Bld: 155 mg/dL — ABNORMAL HIGH (ref 70–99)
Potassium: 4.2 mmol/L (ref 3.5–5.1)
Sodium: 139 mmol/L (ref 135–145)
Total Bilirubin: 0.7 mg/dL (ref 0.3–1.2)
Total Protein: 6.3 g/dL — ABNORMAL LOW (ref 6.5–8.1)

## 2020-02-13 LAB — FERRITIN: Ferritin: 324 ng/mL — ABNORMAL HIGH (ref 11–307)

## 2020-02-13 LAB — C-REACTIVE PROTEIN: CRP: 8.4 mg/dL — ABNORMAL HIGH (ref <1.0)

## 2020-02-13 LAB — GLUCOSE, CAPILLARY
Glucose-Capillary: 132 mg/dL — ABNORMAL HIGH (ref 70–99)
Glucose-Capillary: 163 mg/dL — ABNORMAL HIGH (ref 70–99)
Glucose-Capillary: 139 mg/dL — ABNORMAL HIGH (ref 70–99)
Glucose-Capillary: 142 mg/dL — ABNORMAL HIGH (ref 70–99)
Glucose-Capillary: 143 mg/dL — ABNORMAL HIGH (ref 70–99)

## 2020-02-13 LAB — D-DIMER, QUANTITATIVE: D-Dimer, Quant: 2.5 ug/mL-FEU — ABNORMAL HIGH (ref 0.00–0.50)

## 2020-02-13 LAB — MAGNESIUM: Magnesium: 2.2 mg/dL (ref 1.7–2.4)

## 2020-02-13 LAB — PHOSPHORUS: Phosphorus: 2.7 mg/dL (ref 2.5–4.6)

## 2020-02-13 MED ORDER — ALBUTEROL SULFATE HFA 108 (90 BASE) MCG/ACT IN AERS
2.0000 | INHALATION_SPRAY | Freq: Three times a day (TID) | RESPIRATORY_TRACT | Status: DC
  Administered 2020-02-13 – 2020-02-14 (×4): 2 via RESPIRATORY_TRACT

## 2020-02-13 NOTE — Progress Notes (Signed)
Physical Therapy Treatment Patient Details Name: Angela [name] MRN: [id number] DOB: 05/20/1955 Today's Date: 02/13/2020    History of Present Illness Angela [name] is a 64 y.o. female with medical history significant for hypertension, hypothyroidism, hyperlipidemia and obesity who presents to the emergency department due to increasing shortness of breath due to positive Covid status.  Symptoms started on Friday (12/3) with cough (nonproductive), shortness of breath and fatigue.  She took a home Covid test on same day and tested positive, patient has progressively become weaker and have developed increasing work of breathing which worsens on exertion.  She took over-the-counter medications without any relief.  This morning, she noted that her pulse ox was 80%, so she activated EMS.  On arrival of EMS, patient was noted to have an O2 sat of 85% on room air, she was placed on supplemental oxygen via [place] at 3 LPM with improvement of O2 sat to 92%.    PT Comments    Patient demonstrates labored movement with increased time for sitting up at bedside, slow labored cadence without loss of balance, has to lean on nearby objects for support when not using AD, had episode of incontinent of urine during ambulation, limited mostly due to fatigue and SpO2 dropping from 91% to 83% while on 2 LPM O2, had to increase to 3 LPM to bring above 90% after sitting in chair - RN notified.  Patient will benefit from continued physical therapy in hospital and recommended venue below to increase strength, balance, endurance for safe ADLs and gait.    Follow Up Recommendations  SNF;Supervision - Intermittent;Supervision for mobility/OOB     Equipment Recommendations  None recommended by PT    Recommendations for Other Services       Precautions / Restrictions Precautions Precautions: Fall Restrictions Weight Bearing Restrictions: No    Mobility  Bed Mobility Overal bed mobility: Needs Assistance Bed  Mobility: Supine to Sit     Supine to sit: HOB elevated;Supervision     General bed mobility comments: increased time, labored movement, HOB raised approximately 45 degrees  Transfers Overall transfer level: Needs assistance Equipment used: Rolling walker (2 wheeled) Transfers: Sit to/from [facility] Sit to Stand: Min guard;Min assist Stand pivot transfers: Min guard;Min assist       General transfer comment: increased time, labored movement  Ambulation/Gait Ambulation/Gait assistance: Min guard;Min assist Gait Distance (Feet): 17 Feet Assistive device: Rolling walker (2 wheeled) Gait Pattern/deviations: Decreased step length - left;Decreased stance time - right;Decreased stride length Gait velocity: decreased   General Gait Details: slow labored cadence, very unsteady without using AD, required use of RW for safety, episode of incontinent of urine, limited secondary to fatigue   Stairs             Wheelchair Mobility    Modified Rankin (Stroke Patients Only)       Balance Overall balance assessment: Needs assistance Sitting-balance support: Feet supported;No upper extremity supported Sitting balance-Leahy Scale: Good Sitting balance - Comments: seated at EOB   Standing balance support: During functional activity;No upper extremity supported Standing balance-Leahy Scale: Poor Standing balance comment: fair/poor without AD, fair using RW                            Cognition Arousal/Alertness: Awake/alert Behavior During Therapy: WFL for tasks assessed/performed Overall Cognitive Status: Within Functional Limits for tasks assessed  Exercises General Exercises - Lower Extremity Long Arc Quad: Seated;AROM;Strengthening;Both;10 reps Hip Flexion/Marching: Seated;Strengthening;Both;AROM;10 reps Toe Raises: Seated;AROM;Strengthening;Both;10 reps Heel Raises:  Seated;AROM;Strengthening;Both;10 reps    General Comments        Pertinent Vitals/Pain Pain Assessment: No/denies pain    Home Living                      Prior Function            PT Goals (current goals can now be found in the care plan section) Acute Rehab PT Goals Patient Stated Goal: return home with family to assist PT Goal Formulation: With patient Time For Goal Achievement: 02/25/20 Potential to Achieve Goals: Good Progress towards PT goals: Progressing toward goals    Frequency    Min 3X/week      PT Plan Current plan remains appropriate    Co-evaluation              AM-PAC PT "6 Clicks" Mobility   Outcome Measure  Help needed turning from your back to your side while in a flat bed without using bedrails?: None Help needed moving from lying on your back to sitting on the side of a flat bed without using bedrails?: A Little Help needed moving to and from a bed to a chair (including a wheelchair)?: A Little Help needed standing up from a chair using your arms (e.g., wheelchair or bedside chair)?: A Little Help needed to walk in hospital room?: A Little Help needed climbing 3-5 steps with a railing? : A Lot 6 Click Score: 18    End of Session Equipment Utilized During Treatment: Oxygen Activity Tolerance: Patient tolerated treatment well;Patient limited by fatigue Patient left: in chair;with call bell/phone within reach Nurse Communication: Mobility status PT Visit Diagnosis: Unsteadiness on feet (R26.81);Other abnormalities of gait and mobility (R26.89);Muscle weakness (generalized) (M62.81)     Time: 1224-4975 PT Time Calculation (min) (ACUTE ONLY): 27 min  Charges:  $Therapeutic Exercise: 8-22 mins $Therapeutic Activity: 8-22 mins                     2:31 PM, 02/13/20 Lonell Grandchild, MPT Physical Therapist with [facility] [phone] office [phone] mobile phone

## 2020-02-13 NOTE — Progress Notes (Signed)
PROGRESS NOTE  Angela [name] WUJ:[id number] DOB: 01/04/1956 DOA: 02/10/2020 PCP: Sharilyn Sites, MD  Brief History:  64 year old female with a history of hypothyroidism, hyperlipidemia, elevated blood pressure presenting with 3-day history of progressive coughing, shortness of breath, dyspnea on exertion, and generalized weakness.  The patient began having symptoms on 02/07/2020 and obtained a home COVID-19 test which was positive.  Since that day, her symptoms have progressed.  She checked her oxygen saturation at home and it was 80% on room air.  EMS was activated.  EMS noted the patient have oxygen saturation 85% room air.  She was placed on 3 L with saturation up to 92%.  The patient had some subjective fevers and chills.  She denied any headache, chest pain, hemoptysis, nausea, vomiting, diarrhea, abdominal pain.  She did have some loose stools.  There is no dysuria or hematuria. In the emergency department, the patient was febrile up to 101.1 F.  She was hemodynamically stable.  Oxygen saturation was 94% on 3.5 L.  The patient was started on remdesivir and IV steroids.  Assessment/Plan: Acute respiratory failure with hypoxia secondary COVID-19 -Currently stable on 2.5 L nasal cannula with saturation 92-93%; requiring 3 L mainly on exertion to maintain O2 sat above 90%. -Continue remdesivir and steroids.  Day 4 out of 5. -Continue to follow rheumatology markers. -CRP 11.7>> 14.0>> 8.4 -D-dimer 3.30>> 3.43>> 2.50 -Ferritin 190>>324 -PCT 0.12 -Lactic acid 0.7 -Chest x-ray demonstrating bilateral patchy infiltrates; no pneumothorax. -Will arrange for home oxygen supplementation  Acute kidney injury on CKD stage IIIa -Baseline creatinine 0.9-1.2 -On presentation creatinine 1.37 demonstrating acute kidney injury in the setting of prerenal azotemia most likely. -After fluid resuscitation patient's creatinine is back to her baseline. -Continue to follow renal function trend, avoid  nephrotoxic agents and maintain adequate hydration.  Morbid obesity -BMI 57.5 -Low calorie diet, portion control and increase physical activity discussed with patient. -High concerns for obesity hypoventilation syndrome at baseline. -Patient will benefit of sleep study as an outpatient.  Hypothyroidism -Continue the use of levothyroxine  Hyperlipidemia -Continue simvastatin -Heart healthy diet has been emphasized.  Leg edema/pain -Venous duplex legs negative for DVT -Patient chronic lower extremity edema most likely secondary to obesity. -Low-sodium diet and weight loss emphasized.  Hyperglycemia without prior history of diabetes -Most likely in the setting of steroid usage -Checked A1C 6.0 -Patient is meeting criteria for prediabetes -Continue modified carbohydrate diet.  Thrombocytopenia -likely due to viral infection -Continue to follow platelets count -No signs of overt bleeding.  Physical deconditioning -PT has seen patient and recommended skilled nursing facility -This has been declined and will arrange home health services at time of discharge.  Status is: Inpatient   Dispo: The patient is from: Home              Anticipated d/c is to: Home chest.              Anticipated d/c date is: 1 day              Patient currently is not medically stable to d/c.  Still requiring oxygen supplementation, feeling weak and deconditioned.  Course in the 3 out of 5 of remdesivir infusions.  Patient at baseline independent with activities of daily living and uses a cane for ambulation.  Physical therapy has seen patient and has recommended skilled nursing facility; patient declined SNF and is agreeable with home health PT.   Family Communication:  No  Family at bedside; patient reported that she will update family over the phone by herself.  Consultants:  none  Code Status:  FULL   DVT Prophylaxis:   [place] Lovenox   Procedures: As Listed in Progress Note  Above  Antibiotics: None   Subjective: Patient is afebrile; reports intermittent coughing spells and feeling tired.  Breathing continued to improve.  Unfortunately continued to require oxygen supplementation around 2.5 L at rest and 3 L on exertion.   Objective: Vitals:   02/12/20 2137 02/13/20 0213 02/13/20 0705 02/13/20 0755  BP: (!) 154/91  (!) 141/94   Pulse: 89  74   Resp: 18  20   Temp: 97.8 F (36.6 C)  (!) 96.9 F (36.1 C)   TempSrc: Oral     SpO2: 92% 94% 94% 94%  Weight:      Height:        Intake/Output Summary (Last 24 hours) at 02/13/2020 1328 Last data filed at 02/13/2020 0900 Gross per 24 hour  Intake 240 ml  Output 1400 ml  Net -1160 ml   Weight change:   Exam: General exam: Alert, awake, oriented x 3; no fever and reports no nausea, no vomiting.  Continue to use 3 L nasal cannula supplementation on exertion.  Patient expressed intermittent coughing spells. Respiratory system: Improved air movement bilaterally; no using accessory muscles.  Positive scattered rhonchi. Cardiovascular system:RRR. No murmurs, rubs, gallops.  Unable to assess JVD with body habitus. Gastrointestinal system: Abdomen is obese, nondistended, soft and nontender. No organomegaly or masses felt. Normal bowel sounds heard. Central nervous system: Alert and oriented. No focal neurological deficits. Extremities: No cyanosis or clubbing; 1+ edema appreciated bilaterally.  Chronic access dermatitis also seen. Skin: No petechiae; no open wounds. Psychiatry: Judgement and insight appear normal. Mood & affect appropriate.    Data Reviewed: I have personally reviewed following labs and imaging studies  Basic Metabolic Panel: Recent Labs  Lab 02/10/20 2030 02/11/20 0320 02/12/20 0439 02/13/20 0619  NA 137 140 140 139  K 4.1 4.6 3.8 4.2  CL 102 103 103 104  CO2 27 27 26 28   GLUCOSE 108* 134* 180* 155*  BUN 25* 24* 18 17  CREATININE 1.37* 1.23* 0.97 0.83  CALCIUM 8.0* 8.0* 8.2*  8.2*  MG  --  2.2 2.1 2.2  PHOS  --  3.5 3.2 2.7   Liver Function Tests: Recent Labs  Lab 02/10/20 2030 02/11/20 0320 02/12/20 0439 02/13/20 0619  AST 38 36 34 25  ALT 30 30 27 24   ALKPHOS 42 43 43 42  BILITOT 0.7 1.0 0.8 0.7  PROT 5.9* 6.3* 6.4* 6.3*  ALBUMIN 3.2* 3.2* 3.1* 2.9*   CBC: Recent Labs  Lab 02/10/20 2030 02/11/20 0320 02/12/20 0439 02/13/20 0619  WBC 4.8 3.4* 8.1 7.0  NEUTROABS  --  2.6 7.0 5.8  HGB 12.3 12.7 13.2 13.4  HCT 40.1 41.4 42.5 43.4  MCV 90.9 89.8 88.9 89.1  PLT 146* 138* 176 195   CBG: Recent Labs  Lab 02/12/20 1130 02/12/20 1633 02/12/20 2144 02/13/20 0740 02/13/20 1126  GLUCAP 193* 141* 145* 132* 163*   HbA1C: Recent Labs    02/12/20 0439  HGBA1C 6.0*    Recent Results (from the past 240 hour(s))  Resp Panel by RT-PCR (Flu A&B, Covid) Nasopharyngeal Swab     Status: Abnormal   Collection Time: 02/10/20  3:35 PM   Specimen: Nasopharyngeal Swab; Nasopharyngeal(NP) swabs in vial transport medium  Result Value Ref Range  Status   SARS Coronavirus 2 by RT PCR POSITIVE (A) NEGATIVE Final    Comment: CRITICAL RESULT CALLED TO, READ BACK BY AND VERIFIED WITH: NICKALS,K AT 1937 ON 12.6.21 BY ISLEY,B (NOTE) SARS-CoV-2 target nucleic acids are DETECTED.  The SARS-CoV-2 RNA is generally detectable in upper respiratory specimens during the acute phase of infection. Positive results are indicative of the presence of the identified virus, but do not rule out bacterial infection or co-infection with other pathogens not detected by the test. Clinical correlation with patient history and other diagnostic information is necessary to determine patient infection status. The expected result is Negative.  Fact Sheet for Patients: EntrepreneurPulse.com.au  Fact Sheet for Healthcare Providers: IncredibleEmployment.be  This test is not yet approved or cleared by the [place] FDA and  has been authorized  for detection and/or diagnosis of SARS-CoV-2 by FDA under an Emergency Use Authorization (EUA).  This EUA will remain in effect (meaning t his test can be used) for the duration of  the COVID-19 declaration under Section 564(b)(1) of the Act, 21 U.S.C. section 360bbb-3(b)(1), unless the authorization is terminated or revoked sooner.     Influenza A by PCR NEGATIVE NEGATIVE Final   Influenza B by PCR NEGATIVE NEGATIVE Final    Comment: (NOTE) The Xpert Xpress SARS-CoV-2/FLU/RSV plus assay is intended as an aid in the diagnosis of influenza from Nasopharyngeal swab specimens and should not be used as a sole basis for treatment. Nasal washings and aspirates are unacceptable for Xpert Xpress SARS-CoV-2/FLU/RSV testing.  Fact Sheet for Patients: EntrepreneurPulse.com.au  Fact Sheet for Healthcare Providers: IncredibleEmployment.be  This test is not yet approved or cleared by the [place] FDA and has been authorized for detection and/or diagnosis of SARS-CoV-2 by FDA under an Emergency Use Authorization (EUA). This EUA will remain in effect (meaning this test can be used) for the duration of the COVID-19 declaration under Section 564(b)(1) of the Act, 21 U.S.C. section 360bbb-3(b)(1), unless the authorization is terminated or revoked.  Performed at [facility], [address] [place], [place] [address]   Blood Culture (routine x 2)     Status: None (Preliminary result)   Collection Time: 02/10/20  8:19 PM   Specimen: BLOOD LEFT ARM  Result Value Ref Range Status   Specimen Description BLOOD LEFT ARM  Final   Special Requests   Final    BOTTLES DRAWN AEROBIC AND ANAEROBIC Blood Culture adequate volume   Culture   Final    NO GROWTH 3 DAYS Performed at [facility], [address] [place], [place] [address]    Report Status PENDING  Incomplete  Blood Culture (routine x 2)     Status: None (Preliminary result)   Collection Time: 02/10/20   8:30 PM   Specimen: BLOOD LEFT HAND  Result Value Ref Range Status   Specimen Description BLOOD LEFT HAND  Final   Special Requests   Final    BOTTLES DRAWN AEROBIC AND ANAEROBIC Blood Culture adequate volume   Culture   Final    NO GROWTH 3 DAYS Performed at [facility], [address], [place], [place] [address]    Report Status PENDING  Incomplete     Scheduled Meds: . albuterol  2 puff Inhalation TID  . cholecalciferol  1,000 Units Oral Daily  . dextromethorphan-guaiFENesin  1 tablet Oral BID  . enoxaparin (LOVENOX) injection  90 mg Subcutaneous Q24H  . feeding supplement  237 mL Oral BID BM  . insulin aspart  0-20 Units  Subcutaneous TID WC  . insulin aspart  0-5 Units Subcutaneous QHS  . levothyroxine  125 mcg Oral Daily  . methylPREDNISolone (SOLU-MEDROL) injection  0.5 mg/kg Intravenous Q12H   Followed by  . [START ON 02/14/2020] predniSONE  50 mg Oral Daily  . nystatin   Topical BID  . pantoprazole (PROTONIX) IV  40 mg Intravenous QHS  . simvastatin  20 mg Oral QHS   Continuous Infusions: . remdesivir 100 mg in NS 100 mL 100 mg (02/13/20 1026)    Procedures/Studies: CT ANGIO CHEST PE W OR WO CONTRAST  Result Date: 02/11/2020 CLINICAL DATA:  Dyspnea, COVID-19 positive. EXAM: CT ANGIOGRAPHY CHEST WITH CONTRAST TECHNIQUE: Multidetector CT imaging of the chest was performed using the standard protocol during bolus administration of intravenous contrast. Multiplanar CT image reconstructions and MIPs were obtained to evaluate the vascular anatomy. CONTRAST:  147mL OMNIPAQUE IOHEXOL 350 MG/ML SOLN COMPARISON:  None. FINDINGS: Cardiovascular: Satisfactory opacification of the pulmonary arteries to the segmental level. No evidence of pulmonary embolism. Normal heart size. No pericardial effusion. Mediastinum/Nodes: No enlarged mediastinal, hilar, or axillary lymph nodes. Thyroid gland, trachea, and esophagus demonstrate no significant findings. Lungs/Pleura: No pneumothorax is  noted. Multiple patchy airspace opacities are noted bilaterally consistent with multifocal pneumonia. Minimal bilateral pleural effusions are noted with adjacent subsegmental atelectasis. Upper Abdomen: No acute abnormality. Musculoskeletal: No chest wall abnormality. No acute or significant osseous findings. Review of the MIP images confirms the above findings. IMPRESSION: 1. No definite evidence of pulmonary embolus. 2. Multiple patchy airspace opacities are noted bilaterally consistent with multifocal pneumonia. 3. Minimal bilateral pleural effusions are noted with adjacent subsegmental atelectasis. Electronically Signed   By: Marijo Conception M.D.   On: 02/11/2020 10:28   US Venous Img Lower Bilateral (DVT)  Result Date: 02/11/2020 CLINICAL DATA:  Bilateral lower extremity edema EXAM: BILATERAL LOWER EXTREMITY VENOUS DOPPLER ULTRASOUND TECHNIQUE: Gray-scale sonography with graded compression, as well as color Doppler and duplex ultrasound were performed to evaluate the lower extremity deep venous systems from the level of the common femoral vein and including the common femoral, femoral, profunda femoral, popliteal and calf veins including the posterior tibial, peroneal and gastrocnemius veins when visible. The superficial great saphenous vein was also interrogated. Spectral Doppler was utilized to evaluate flow at rest and with distal augmentation maneuvers in the common femoral, femoral and popliteal veins. COMPARISON:  None. FINDINGS: RIGHT LOWER EXTREMITY Common Femoral Vein: No evidence of thrombus. Normal compressibility, respiratory phasicity and response to augmentation. Saphenofemoral Junction: No evidence of thrombus. Normal compressibility and flow on color Doppler imaging. Profunda Femoral Vein: No evidence of thrombus. Normal compressibility and flow on color Doppler imaging. Femoral Vein: No evidence of thrombus. Normal compressibility, respiratory phasicity and response to augmentation.  Popliteal Vein: No evidence of thrombus. Normal compressibility, respiratory phasicity and response to augmentation. Calf Veins: No evidence of thrombus. Normal compressibility and flow on color Doppler imaging. Superficial Great Saphenous Vein: No evidence of thrombus. Normal compressibility. Venous Reflux:  None. Other Findings:  None. LEFT LOWER EXTREMITY Common Femoral Vein: No evidence of thrombus. Normal compressibility, respiratory phasicity and response to augmentation. Saphenofemoral Junction: No evidence of thrombus. Normal compressibility and flow on color Doppler imaging. Profunda Femoral Vein: No evidence of thrombus. Normal compressibility and flow on color Doppler imaging. Femoral Vein: No evidence of thrombus. Normal compressibility, respiratory phasicity and response to augmentation. Popliteal Vein: No evidence of thrombus. Normal compressibility, respiratory phasicity and response to augmentation. Calf Veins: No evidence of thrombus. Normal  compressibility and flow on color Doppler imaging. Superficial Great Saphenous Vein: No evidence of thrombus. Normal compressibility. Venous Reflux:  None. Other Findings:  None. IMPRESSION: No evidence of deep venous thrombosis in either lower extremity. Electronically Signed   By: Jacqulynn Cadet M.D.   On: 02/11/2020 14:34   DG Chest Portable 1 View  Result Date: 02/10/2020 CLINICAL DATA:  COVID positive with shortness of breath. EXAM: PORTABLE CHEST 1 VIEW COMPARISON:  August 18, 2005 FINDINGS: Mild multifocal infiltrates are seen throughout both lungs. There is no evidence of a pleural effusion or pneumothorax. The cardiac silhouette is mildly enlarged. The visualized skeletal structures are unremarkable. IMPRESSION: Mild bilateral multifocal infiltrates. Electronically Signed   By: Virgina Norfolk M.D.   On: 02/10/2020 16:05    Barton Dubois, MD  [facility]  02/13/2020, 1:28 PM   LOS: 3 days

## 2020-02-14 LAB — COMPREHENSIVE METABOLIC PANEL
ALT: 22 U/L (ref 0–44)
AST: 17 U/L (ref 15–41)
Albumin: 2.9 g/dL — ABNORMAL LOW (ref 3.5–5.0)
Alkaline Phosphatase: 38 U/L (ref 38–126)
Anion gap: 8 (ref 5–15)
BUN: 18 mg/dL (ref 8–23)
CO2: 31 mmol/L (ref 22–32)
Calcium: 8.3 mg/dL — ABNORMAL LOW (ref 8.9–10.3)
Chloride: 102 mmol/L (ref 98–111)
Creatinine, Ser: 0.81 mg/dL (ref 0.44–1.00)
GFR, Estimated: >60 mL/min (ref >60)
Glucose, Bld: 129 mg/dL — ABNORMAL HIGH (ref 70–99)
Potassium: 4.1 mmol/L (ref 3.5–5.1)
Sodium: 141 mmol/L (ref 135–145)
Total Bilirubin: 0.7 mg/dL (ref 0.3–1.2)
Total Protein: 6.1 g/dL — ABNORMAL LOW (ref 6.5–8.1)

## 2020-02-14 LAB — CBC WITH DIFFERENTIAL/PLATELET
Abs Immature Granulocytes: 0.07 10*3/uL (ref 0.00–0.07)
Basophils Absolute: 0 10*3/uL (ref 0.0–0.1)
Basophils Relative: 0 %
Eosinophils Absolute: 0 10*3/uL (ref 0.0–0.5)
Eosinophils Relative: 0 %
HCT: 44.7 % (ref 36.0–46.0)
Hemoglobin: 13.5 g/dL (ref 12.0–15.0)
Immature Granulocytes: 1 %
Lymphocytes Relative: 8 %
Lymphs Abs: 0.8 10*3/uL (ref 0.7–4.0)
MCH: 27.1 pg (ref 26.0–34.0)
MCHC: 30.2 g/dL (ref 30.0–36.0)
MCV: 89.6 fL (ref 80.0–100.0)
Monocytes Absolute: 1.2 10*3/uL — ABNORMAL HIGH (ref 0.1–1.0)
Monocytes Relative: 12 %
Neutro Abs: 8 10*3/uL — ABNORMAL HIGH (ref 1.7–7.7)
Neutrophils Relative %: 79 %
Platelets: 197 10*3/uL (ref 150–400)
RBC: 4.99 MIL/uL (ref 3.87–5.11)
RDW: 14.1 % (ref 11.5–15.5)
WBC: 10 10*3/uL (ref 4.0–10.5)
nRBC: 0 % (ref 0.0–0.2)

## 2020-02-14 LAB — GLUCOSE, CAPILLARY
Glucose-Capillary: 101 mg/dL — ABNORMAL HIGH (ref 70–99)
Glucose-Capillary: 111 mg/dL — ABNORMAL HIGH (ref 70–99)

## 2020-02-14 LAB — D-DIMER, QUANTITATIVE: D-Dimer, Quant: 1.21 ug/mL-FEU — ABNORMAL HIGH (ref 0.00–0.50)

## 2020-02-14 LAB — PHOSPHORUS: Phosphorus: 2.6 mg/dL (ref 2.5–4.6)

## 2020-02-14 LAB — C-REACTIVE PROTEIN: CRP: 5.7 mg/dL — ABNORMAL HIGH (ref <1.0)

## 2020-02-14 LAB — FERRITIN: Ferritin: 243 ng/mL (ref 11–307)

## 2020-02-14 LAB — MAGNESIUM: Magnesium: 2.1 mg/dL (ref 1.7–2.4)

## 2020-02-14 MED ORDER — PREDNISONE 20 MG PO TABS: ORAL_TABLET | ORAL | 0 refills | Status: DC

## 2020-02-14 MED ORDER — DM-GUAIFENESIN ER 30-600 MG PO TB12
1.0000 | ORAL_TABLET | Freq: Two times a day (BID) | ORAL | 0 refills | Status: DC
Start: 2020-02-14 — End: 2023-10-14

## 2020-02-14 MED ORDER — ALBUTEROL SULFATE HFA 108 (90 BASE) MCG/ACT IN AERS: 2.0000 | INHALATION_SPRAY | Freq: Four times a day (QID) | RESPIRATORY_TRACT | 1 refills | Status: DC | PRN

## 2020-02-14 MED ORDER — DM-GUAIFENESIN ER 30-600 MG PO TB12
1.0000 | ORAL_TABLET | Freq: Two times a day (BID) | ORAL | 0 refills | Status: DC
Start: 2020-02-14 — End: 2020-02-14

## 2020-02-14 MED ORDER — PANTOPRAZOLE SODIUM 40 MG PO TBEC: 40.0000 mg | DELAYED_RELEASE_TABLET | Freq: Every day | ORAL | 1 refills | Status: DC

## 2020-02-14 NOTE — Care Management Important Message (Signed)
Important Message  Patient Details  Name: Angela [name] MRN: [id number] Date of Birth: 1955/12/01   Medicare Important Message Given:  Yes - Important Message mailed due to current National Emergency     Tommy Medal 02/14/2020, 3:55 PM

## 2020-02-14 NOTE — Progress Notes (Signed)
Patient being discharged home. Her son is going to pick her up. Patient being discharged home on 3L [place]. Oxygen dropped off to the hospital and I educated patient on how to use the oxygen machine and backup. Let her know that the company will be contacting her to set up an appointment in the next few days. The phone number with the company's information/phone number was given to patient. Reviewed all medications with patient and prescriptions were printed by Dr. Dyann Kief and given to patient. Reviewed discharged instructions with patient. Will monitor and wait for transportation.

## 2020-02-14 NOTE — Discharge Summary (Signed)
Physician Discharge Summary  JEARLENE BRIDWELL SLH:[id number] DOB: [birth year] DOA: 02/10/2020  PCP: Sharilyn Sites, MD  Admit date: 02/10/2020 Discharge date: 02/14/2020  Time spent: 35 minutes  Recommendations for Outpatient Follow-up:  1. Repeat basic metabolic panel to follow electrolytes and renal function 2. Repeat CBC to follow platelets count stability 3. Repeat chest x-ray at 4-6 weeks to assure complete resolution of infiltrates. 4. Close monitoring of patient CBGs/A1c with initiation of hypoglycemic management as required.   Discharge Diagnoses:  Principal Problem:   Pneumonia due to COVID-19 virus Active Problems:   Hyperlipidemia   Hypothyroid   Morbid obesity (HCC)   Thrombocytopenia (HCC)   Hypoalbuminemia   Elevated d-dimer   AKI (acute kidney injury) ([place])   Acute respiratory failure with hypoxia (HCC)   Candidal intertrigo   Discharge Condition: Stable and improved.  Discharged home with instruction to follow-up with PCP in 2 weeks.  CODE STATUS: Full code.  Diet recommendation: Heart healthy, low calorie and modify carbohydrates.  Filed Weights   02/10/20 1532  Weight: (!) 187 kg    History of present illness:  64 year old female with a history of hypothyroidism, hyperlipidemia, elevated blood pressure presenting with 3-day history of progressive coughing, shortness of breath, dyspnea on exertion, and generalized weakness.  The patient began having symptoms on 02/07/2020 and obtained a home COVID-19 test which was positive.  Since that day, her symptoms have progressed.  She checked her oxygen saturation at home and it was 80% on room air.  EMS was activated.  EMS noted the patient have oxygen saturation 85% room air.  She was placed on 3 L with saturation up to 92%.  The patient had some subjective fevers and chills.  She denied any headache, chest pain, hemoptysis, nausea, vomiting, diarrhea, abdominal pain.  She did have some loose stools.  There is no  dysuria or hematuria. In the emergency department, the patient was febrile up to 101.1 F.  She was hemodynamically stable.  Oxygen saturation was 94% on 3.5 L.  The patient was started on remdesivir and IV steroids.  Hospital Course:  Acute respiratory failure with hypoxia secondary COVID-19 -Currently stable on 2.5 L nasal cannula with saturation 92-93%; requiring 3 L mainly on exertion to maintain O2 sat above 90%. -Patient has completed 5 days of remdesivir infusion and will be discharged home on the steroids tapering. -Will arrange for home oxygen supplementation. -Outpatient follow-up with PCP in 2 weeks requested.  Acute kidney injury on CKD stage IIIa -Baseline creatinine 0.9-1.2 -On presentation creatinine 1.37 demonstrating acute kidney injury in the setting of prerenal azotemia most likely. -After fluid resuscitation patient's creatinine is back to her baseline. -Continue to maintain adequate hydration and minimize the use of nephrotoxic agents. -Repeat basic metabolic panel to follow electrolytes and renal function and stability.  Morbid obesity -BMI 57.5 -Low calorie diet, portion control and increase physical activity discussed with patient. -High concerns for obesity hypoventilation syndrome at baseline. -Patient will benefit of sleep study as an outpatient. -Bariatric clinic referral also recommended.  Hypothyroidism -Continue the use of levothyroxine  Hyperlipidemia -Continue simvastatin -Heart healthy diet has been  encouraged.  Leg edema/pain -Venous duplex legs negative for DVT -Patient chronic lower extremity edema most likely secondary to obesity. -Low-sodium diet and weight loss emphasized.  Hyperglycemia without prior history of diabetes -Most likely in the setting of steroid usage -Checked A1C  6.0 -Patient is meeting criteria for prediabetes -Continue modified carbohydrate diet. -When fully recover outpatient initiation of Metformin  recommended.  Thrombocytopenia -likely due to viral infection -Continue to follow platelets count -No signs of overt bleeding. -Repeat CBC at follow-up visit to assess platelet count and stability.  Physical deconditioning -PT has seen patient and recommended skilled nursing facility -This has been declined and home health services has been arranged at time of discharge.  Procedures: See below for x-ray reports.  Consultations:  None  Discharge Exam: Vitals:   02/14/20 1404 02/14/20 1439  BP: (!) 152/82   Pulse:    Resp:    Temp:    SpO2:  95%    General: Alert, awake and oriented x3; no fever and reports no nausea, no vomiting.  Still requiring 3 L nasal cannula supplementation to keep O2 sat above 90-92%.  Patient reports significant improvement in her breathing and was able to speak in full sentences at time of discharge.  No fever Cardiovascular: RRR, no murmurs, no rubs, no gallops, unable to assess JVD with body habitus. Respiratory: Improved air movement bilaterally; no using accessory muscles.  Positive scattered rhonchi.  No crackles, no wheezing. Abdomen: Obese, soft, nontender, distended, positive bowel sounds. Extremities: No cyanosis or clubbing.  1+ edema appreciated bilaterally (per patient no new findings), chronic stasis dermatitis also seen.  Discharge Instructions   Discharge Instructions    Diet - low sodium heart healthy   Complete by: As directed    Discharge instructions   Complete by: As directed    Take medications as prescribed Follow heart healthy, low calorie and modified carbohydrate diet. -Arrange follow-up with PCP in 10 days Continue to maintain adequate hydration. Continue to follow the 3 top use (Washing hands frequently, Waiting 6 feet apart and Wearing a mask over you mouth and nose).   Increase activity slowly   Complete by: As directed      Allergies as of 02/14/2020   No Known Allergies     Medication List    TAKE  these medications   acetaminophen 500 MG tablet Commonly known as: TYLENOL Take 1,000 mg by mouth every 6 (six) hours as needed for headache.   albuterol 108 (90 Base) MCG/ACT inhaler Commonly known as: VENTOLIN HFA Inhale 2 puffs into the lungs every 6 (six) hours as needed for wheezing or shortness of breath.   cholecalciferol 25 MCG (1000 UNIT) tablet Commonly known as: VITAMIN D3 Take 1,000 Units by mouth daily.   dextromethorphan-guaiFENesin 30-600 MG 12hr tablet Commonly known as: MUCINEX DM Take 1 tablet by mouth 2 (two) times daily.   furosemide 40 MG tablet Commonly known as: LASIX Take 40 mg by mouth daily as needed.   levothyroxine 125 MCG tablet Commonly known as: SYNTHROID Take 125 mcg by mouth daily. What changed: Another medication with the same name was removed. Continue taking this medication, and follow the directions you see here.   mupirocin ointment 2 % Commonly known as: BACTROBAN Apply 1 application topically See admin instructions. Use 1 application topically 2 to 3 times daily   pantoprazole 40 MG tablet Commonly known as: Protonix Take 1 tablet (40 mg total) by mouth daily.   predniSONE 20 MG tablet Commonly known as: DELTASONE Take 3 tablets by mouth daily x1 day; then 2 tablets by mouth daily x2 days; then 1 tablet by mouth daily x3 days; then half tablet by mouth daily x3 days and stop prednisone.   simvastatin 20 MG tablet Commonly known as: ZOCOR Take 20 mg by mouth at bedtime.   vitamin C 100 MG tablet Take 100  mg by mouth daily.            Durable Medical Equipment  (From admission, onward)         Start     Ordered   02/13/20 1339  For home use only DME oxygen  Once       Question Answer Comment  Length of Need 12 Months   Mode or (Route) Nasal cannula   Liters per Minute 3   Frequency Continuous (stationary and portable oxygen unit needed)   Oxygen conserving device Yes   Oxygen delivery system Gas      02/13/20 1339          No Known Allergies  Follow-up Information    [facility] Follow up.   Why: Your home oxygen is being supplied via [facility].       Sharilyn Sites, MD. Schedule an appointment as soon as possible for a visit in 2 week(s).   Specialty: Family Medicine Contact information: [address] [place] [address] [phone]               The results of significant diagnostics from this hospitalization (including imaging, microbiology, ancillary and laboratory) are listed below for reference.    Significant Diagnostic Studies: CT ANGIO CHEST PE W OR WO CONTRAST  Result Date: 02/11/2020 CLINICAL DATA:  Dyspnea, COVID-19 positive. EXAM: CT ANGIOGRAPHY CHEST WITH CONTRAST TECHNIQUE: Multidetector CT imaging of the chest was performed using the standard protocol during bolus administration of intravenous contrast. Multiplanar CT image reconstructions and MIPs were obtained to evaluate the vascular anatomy. CONTRAST:  181mL OMNIPAQUE IOHEXOL 350 MG/ML SOLN COMPARISON:  None. FINDINGS: Cardiovascular: Satisfactory opacification of the pulmonary arteries to the segmental level. No evidence of pulmonary embolism. Normal heart size. No pericardial effusion. Mediastinum/Nodes: No enlarged mediastinal, hilar, or axillary lymph nodes. Thyroid gland, trachea, and esophagus demonstrate no significant findings. Lungs/Pleura: No pneumothorax is noted. Multiple patchy airspace opacities are noted bilaterally consistent with multifocal pneumonia. Minimal bilateral pleural effusions are noted with adjacent subsegmental atelectasis. Upper Abdomen: No acute abnormality. Musculoskeletal: No chest wall abnormality. No acute or significant osseous findings. Review of the MIP images confirms the above findings. IMPRESSION: 1. No definite evidence of pulmonary embolus. 2. Multiple patchy airspace opacities are noted bilaterally consistent with multifocal pneumonia. 3. Minimal bilateral pleural  effusions are noted with adjacent subsegmental atelectasis. Electronically Signed   By: Marijo Conception M.D.   On: 02/11/2020 10:28   US Venous Img Lower Bilateral (DVT)  Result Date: 02/11/2020 CLINICAL DATA:  Bilateral lower extremity edema EXAM: BILATERAL LOWER EXTREMITY VENOUS DOPPLER ULTRASOUND TECHNIQUE: Gray-scale sonography with graded compression, as well as color Doppler and duplex ultrasound were performed to evaluate the lower extremity deep venous systems from the level of the common femoral vein and including the common femoral, femoral, profunda femoral, popliteal and calf veins including the posterior tibial, peroneal and gastrocnemius veins when visible. The superficial great saphenous vein was also interrogated. Spectral Doppler was utilized to evaluate flow at rest and with distal augmentation maneuvers in the common femoral, femoral and popliteal veins. COMPARISON:  None. FINDINGS: RIGHT LOWER EXTREMITY Common Femoral Vein: No evidence of thrombus. Normal compressibility, respiratory phasicity and response to augmentation. Saphenofemoral Junction: No evidence of thrombus. Normal compressibility and flow on color Doppler imaging. Profunda Femoral Vein: No evidence of thrombus. Normal compressibility and flow on color Doppler imaging. Femoral Vein: No evidence of thrombus. Normal compressibility, respiratory phasicity and response to augmentation. Popliteal Vein: No evidence of  thrombus. Normal compressibility, respiratory phasicity and response to augmentation. Calf Veins: No evidence of thrombus. Normal compressibility and flow on color Doppler imaging. Superficial Great Saphenous Vein: No evidence of thrombus. Normal compressibility. Venous Reflux:  None. Other Findings:  None. LEFT LOWER EXTREMITY Common Femoral Vein: No evidence of thrombus. Normal compressibility, respiratory phasicity and response to augmentation. Saphenofemoral Junction: No evidence of thrombus. Normal compressibility  and flow on color Doppler imaging. Profunda Femoral Vein: No evidence of thrombus. Normal compressibility and flow on color Doppler imaging. Femoral Vein: No evidence of thrombus. Normal compressibility, respiratory phasicity and response to augmentation. Popliteal Vein: No evidence of thrombus. Normal compressibility, respiratory phasicity and response to augmentation. Calf Veins: No evidence of thrombus. Normal compressibility and flow on color Doppler imaging. Superficial Great Saphenous Vein: No evidence of thrombus. Normal compressibility. Venous Reflux:  None. Other Findings:  None. IMPRESSION: No evidence of deep venous thrombosis in either lower extremity. Electronically Signed   By: Jacqulynn Cadet M.D.   On: 02/11/2020 14:34   DG Chest Portable 1 View  Result Date: 02/10/2020 CLINICAL DATA:  COVID positive with shortness of breath. EXAM: PORTABLE CHEST 1 VIEW COMPARISON:  August 18, 2005 FINDINGS: Mild multifocal infiltrates are seen throughout both lungs. There is no evidence of a pleural effusion or pneumothorax. The cardiac silhouette is mildly enlarged. The visualized skeletal structures are unremarkable. IMPRESSION: Mild bilateral multifocal infiltrates. Electronically Signed   By: Virgina Norfolk M.D.   On: 02/10/2020 16:05    Microbiology: Recent Results (from the past 240 hour(s))  Resp Panel by RT-PCR (Flu A&B, Covid) Nasopharyngeal Swab     Status: Abnormal   Collection Time: 02/10/20  3:35 PM   Specimen: Nasopharyngeal Swab; Nasopharyngeal(NP) swabs in vial transport medium  Result Value Ref Range Status   SARS Coronavirus 2 by RT PCR POSITIVE (A) NEGATIVE Final    Comment: CRITICAL RESULT CALLED TO, READ BACK BY AND VERIFIED WITH: NICKALS,K AT 1937 ON 12.6.21 BY ISLEY,B (NOTE) SARS-CoV-2 target nucleic acids are DETECTED.  The SARS-CoV-2 RNA is generally detectable in upper respiratory specimens during the acute phase of infection. Positive results are indicative of the  presence of the identified virus, but do not rule out bacterial infection or co-infection with other pathogens not detected by the test. Clinical correlation with patient history and other diagnostic information is necessary to determine patient infection status. The expected result is Negative.  Fact Sheet for Patients: EntrepreneurPulse.com.au  Fact Sheet for Healthcare Providers: IncredibleEmployment.be  This test is not yet approved or cleared by the [place] FDA and  has been authorized for detection and/or diagnosis of SARS-CoV-2 by FDA under an Emergency Use Authorization (EUA).  This EUA will remain in effect (meaning t his test can be used) for the duration of  the COVID-19 declaration under Section 564(b)(1) of the Act, 21 U.S.C. section 360bbb-3(b)(1), unless the authorization is terminated or revoked sooner.     Influenza A by PCR NEGATIVE NEGATIVE Final   Influenza B by PCR NEGATIVE NEGATIVE Final    Comment: (NOTE) The Xpert Xpress SARS-CoV-2/FLU/RSV plus assay is intended as an aid in the diagnosis of influenza from Nasopharyngeal swab specimens and should not be used as a sole basis for treatment. Nasal washings and aspirates are unacceptable for Xpert Xpress SARS-CoV-2/FLU/RSV testing.  Fact Sheet for Patients: EntrepreneurPulse.com.au  Fact Sheet for Healthcare Providers: IncredibleEmployment.be  This test is not yet approved or cleared by the [place] FDA and has been authorized for detection and/or  diagnosis of SARS-CoV-2 by FDA under an Emergency Use Authorization (EUA). This EUA will remain in effect (meaning this test can be used) for the duration of the COVID-19 declaration under Section 564(b)(1) of the Act, 21 U.S.C. section 360bbb-3(b)(1), unless the authorization is terminated or revoked.  Performed at [facility], [address] [place], [place] [address]   Blood  Culture (routine x 2)     Status: None (Preliminary result)   Collection Time: 02/10/20  8:19 PM   Specimen: BLOOD LEFT ARM  Result Value Ref Range Status   Specimen Description BLOOD LEFT ARM  Final   Special Requests   Final    BOTTLES DRAWN AEROBIC AND ANAEROBIC Blood Culture adequate volume   Culture   Final    NO GROWTH 4 DAYS Performed at [facility], [address] [place], [place] [address]    Report Status PENDING  Incomplete  Blood Culture (routine x 2)     Status: None (Preliminary result)   Collection Time: 02/10/20  8:30 PM   Specimen: BLOOD LEFT HAND  Result Value Ref Range Status   Specimen Description BLOOD LEFT HAND  Final   Special Requests   Final    BOTTLES DRAWN AEROBIC AND ANAEROBIC Blood Culture adequate volume   Culture   Final    NO GROWTH 4 DAYS Performed at [facility], [address] [place], [place] [address]    Report Status PENDING  Incomplete     Labs: Basic Metabolic Panel: Recent Labs  Lab 02/10/20 2030 02/11/20 0320 02/12/20 0439 02/13/20 0619 02/14/20 0554  NA 137 140 140 139 141  K 4.1 4.6 3.8 4.2 4.1  CL 102 103 103 104 102  CO2 27 27 26 28 31   GLUCOSE 108* 134* 180* 155* 129*  BUN 25* 24* 18 17 18   CREATININE 1.37* 1.23* 0.97 0.83 0.81  CALCIUM 8.0* 8.0* 8.2* 8.2* 8.3*  MG  --  2.2 2.1 2.2 2.1  PHOS  --  3.5 3.2 2.7 2.6   Liver Function Tests: Recent Labs  Lab 02/10/20 2030 02/11/20 0320 02/12/20 0439 02/13/20 0619 02/14/20 0554  AST 38 36 34 25 17  ALT 30 30 27 24 22   ALKPHOS 42 43 43 42 38  BILITOT 0.7 1.0 0.8 0.7 0.7  PROT 5.9* 6.3* 6.4* 6.3* 6.1*  ALBUMIN 3.2* 3.2* 3.1* 2.9* 2.9*   CBC: Recent Labs  Lab 02/10/20 2030 02/11/20 0320 02/12/20 0439 02/13/20 0619 02/14/20 0554  WBC 4.8 3.4* 8.1 7.0 10.0  NEUTROABS  --  2.6 7.0 5.8 8.0*  HGB 12.3 12.7 13.2 13.4 13.5  HCT 40.1 41.4 42.5 43.4 44.7  MCV 90.9 89.8 88.9 89.1 89.6  PLT 146* 138* 176 195 197   BNP (last 3 results) Recent Labs     02/12/20 0439  BNP 337.0*   CBG: Recent Labs  Lab 02/13/20 1617 02/13/20 2109 02/13/20 2150 02/14/20 0734 02/14/20 1107  GLUCAP 139* 142* 143* 101* 111*   Signed:  Barton Dubois MD.  [facility] 02/14/2020, 3:22 PM

## 2020-02-14 NOTE — TOC Transition Note (Signed)
Transition of Care [facility]) - CM/SW Discharge Note   Patient Details  Name: Angela [name] MRN: [id number] Date of Birth: 1955-04-23  [facility] CM/SW Contact:  Ihor Gully, LCSW Phone Number: 02/14/2020, 2:31 PM   Clinical Narrative:    Patient in need of home oxygen. Discussed DME providers. Agreeable to [facility] providing oxygen. Referral made.      Barriers to Discharge: Continued Medical Work up   Patient Goals and CMS Choice Patient states their goals for this hospitalization and ongoing recovery are:: patient wants to rehab then return home.      Discharge Placement                       Discharge Plan and Services                DME Arranged: Oxygen DME Agency: [facility] Date DME Agency Contacted: 02/14/20 Time DME Agency Contacted: [id number] Representative spoke with at DME Agency: Rodney [place]: PT [place]: [place] Date [place]: 02/12/20 Time [place]: [place] Representative spoke with at [place]: [place] ([place]) Interventions     Readmission Risk Interventions No flowsheet data found.

## 2020-02-14 NOTE — Progress Notes (Signed)
SATURATION QUALIFICATIONS: (This note is used to comply with regulatory documentation for home oxygen)  Patient Saturations on Room Air at Rest = 89%  Patient Saturations on Room Air while Ambulating = 84%  Patient Saturations on 4 Liters of oxygen while Ambulating = 93%  Please briefly explain why patient needs home oxygen: Covid

## 2020-02-15 LAB — CULTURE, BLOOD (ROUTINE X 2)
Culture: NO GROWTH
Culture: NO GROWTH
Special Requests: ADEQUATE
Special Requests: ADEQUATE

## 2020-02-20 DIAGNOSIS — J1282 Pneumonia due to coronavirus disease 2019: Secondary | ICD-10-CM | POA: Diagnosis not present

## 2020-02-20 DIAGNOSIS — U071 COVID-19: Secondary | ICD-10-CM | POA: Diagnosis not present

## 2020-02-26 DIAGNOSIS — J1282 Pneumonia due to coronavirus disease 2019: Secondary | ICD-10-CM | POA: Diagnosis not present

## 2020-02-26 DIAGNOSIS — R791 Abnormal coagulation profile: Secondary | ICD-10-CM | POA: Diagnosis not present

## 2020-02-26 DIAGNOSIS — Z6841 Body Mass Index (BMI) 40.0 and over, adult: Secondary | ICD-10-CM | POA: Diagnosis not present

## 2020-02-26 DIAGNOSIS — N179 Acute kidney failure, unspecified: Secondary | ICD-10-CM | POA: Diagnosis not present

## 2020-03-16 DIAGNOSIS — J1282 Pneumonia due to coronavirus disease 2019: Secondary | ICD-10-CM | POA: Diagnosis not present

## 2020-03-16 DIAGNOSIS — U071 COVID-19: Secondary | ICD-10-CM | POA: Diagnosis not present

## 2020-03-20 DIAGNOSIS — D72829 Elevated white blood cell count, unspecified: Secondary | ICD-10-CM | POA: Diagnosis not present

## 2020-03-20 DIAGNOSIS — R944 Abnormal results of kidney function studies: Secondary | ICD-10-CM | POA: Diagnosis not present

## 2020-03-22 DIAGNOSIS — J1282 Pneumonia due to coronavirus disease 2019: Secondary | ICD-10-CM | POA: Diagnosis not present

## 2020-03-22 DIAGNOSIS — U071 COVID-19: Secondary | ICD-10-CM | POA: Diagnosis not present

## 2020-04-22 DIAGNOSIS — U071 COVID-19: Secondary | ICD-10-CM | POA: Diagnosis not present

## 2020-04-22 DIAGNOSIS — J1282 Pneumonia due to coronavirus disease 2019: Secondary | ICD-10-CM | POA: Diagnosis not present

## 2021-01-06 IMAGING — US US EXTREM LOW VENOUS
1 series · 13 of 24 positions shown · non-contrast
Comparison: None.

CLINICAL DATA: Bilateral lower extremity edema



[Series 1: us venous img lower bilat (dvt) · portal-venous · 13 of 63 slices shown]
[im 1/63]
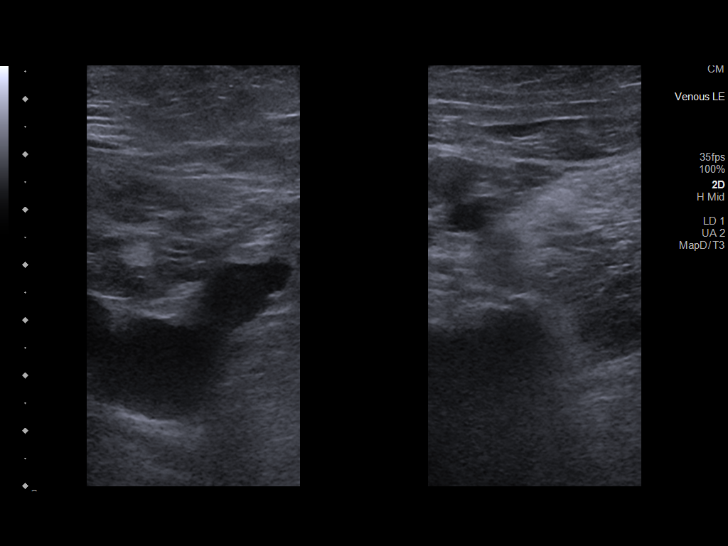
[im 6/63]
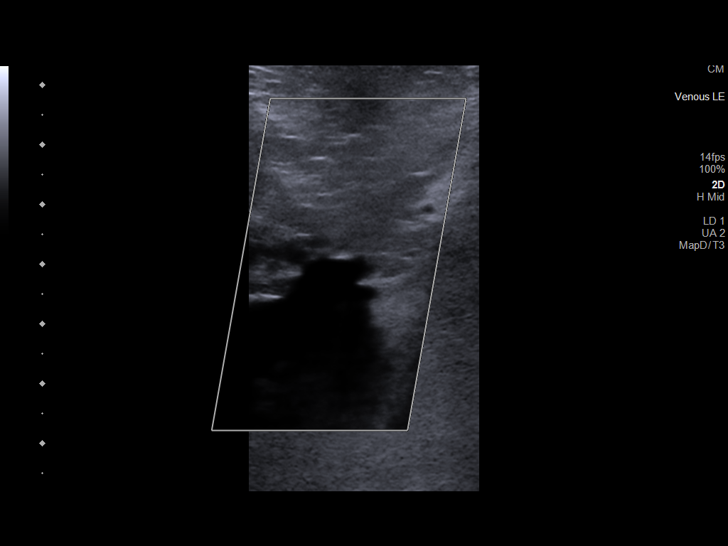
[im 11/63]
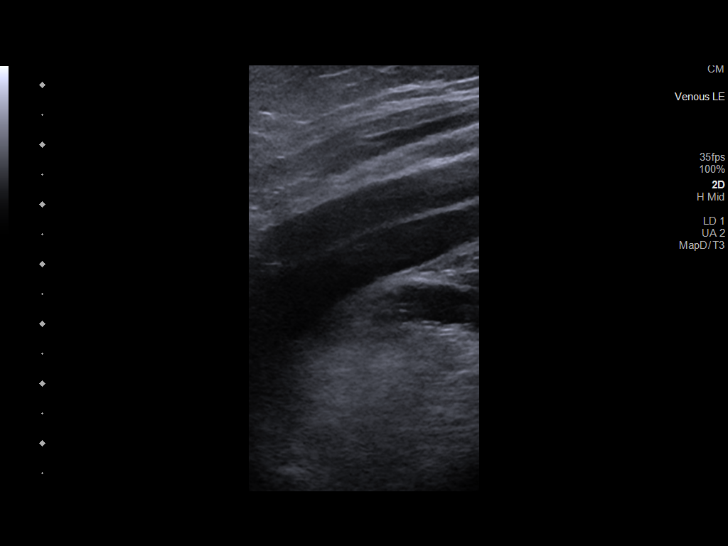
[im 17/63]
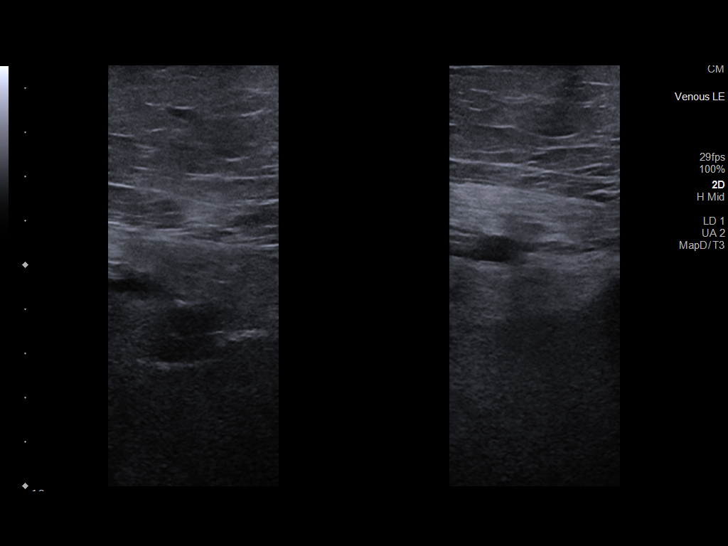
[im 22/63]
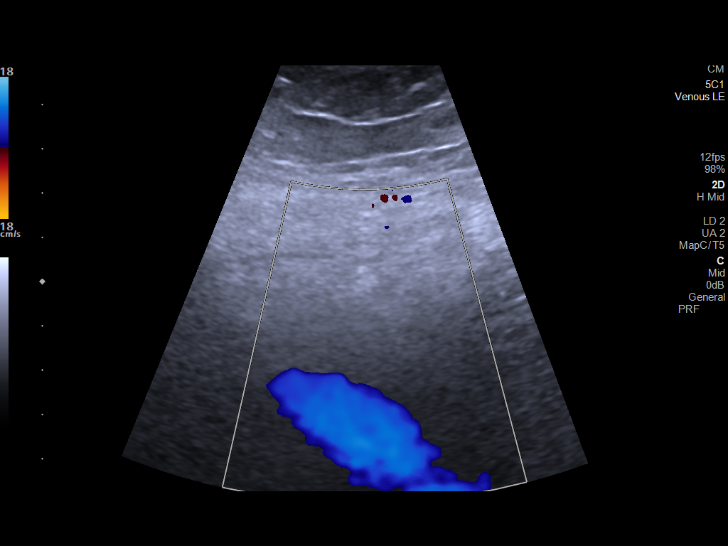
[im 27/63]
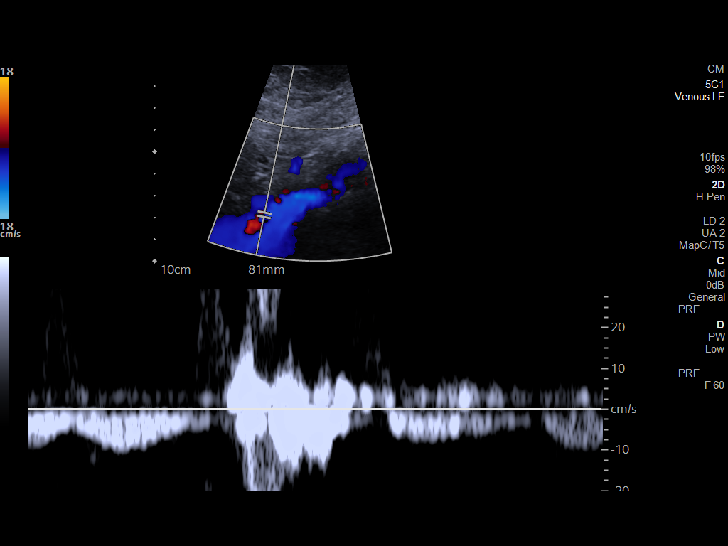
[im 33/63]
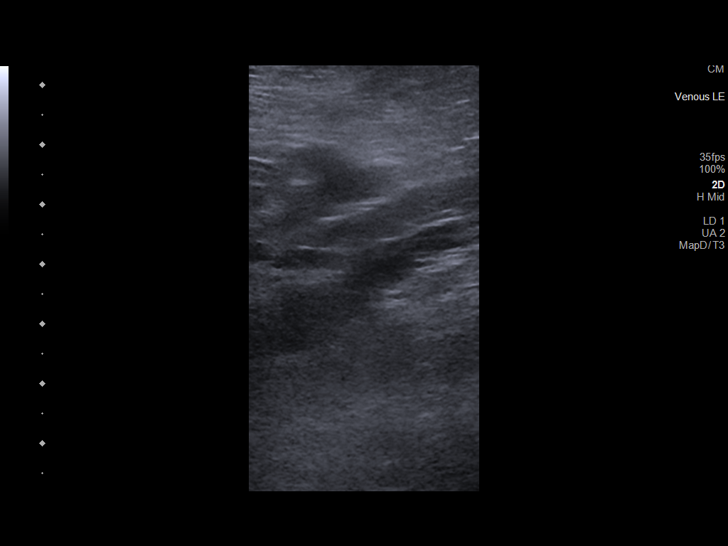
[im 36/63]
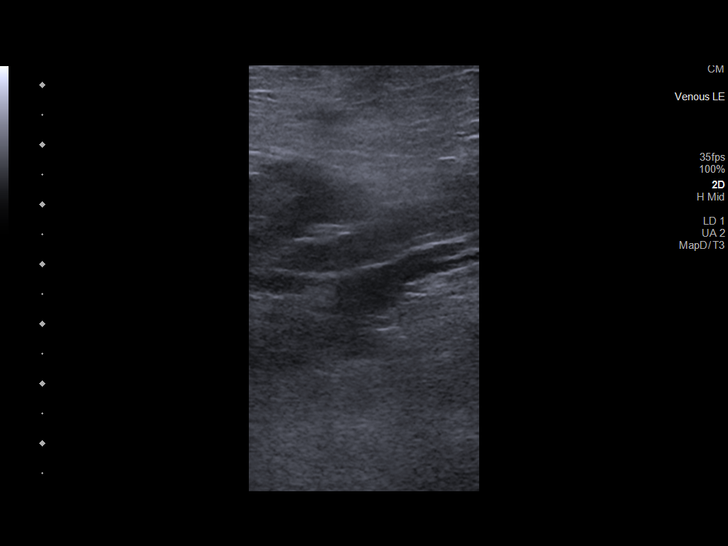
[im 41/63]
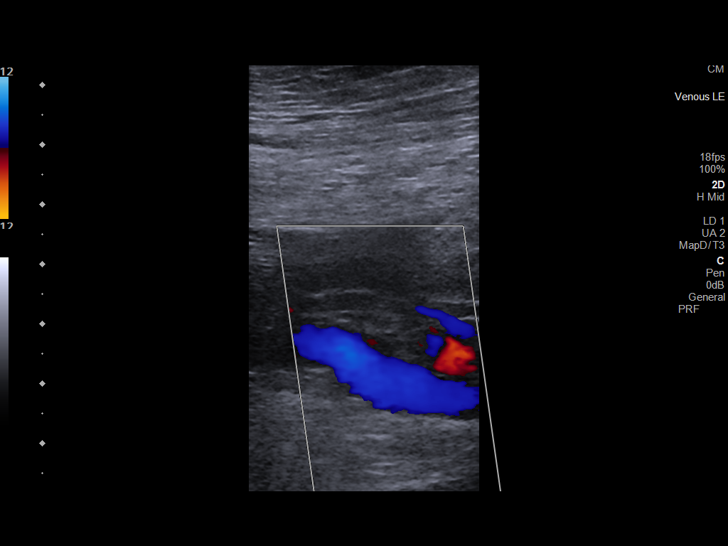
[im 46/63]
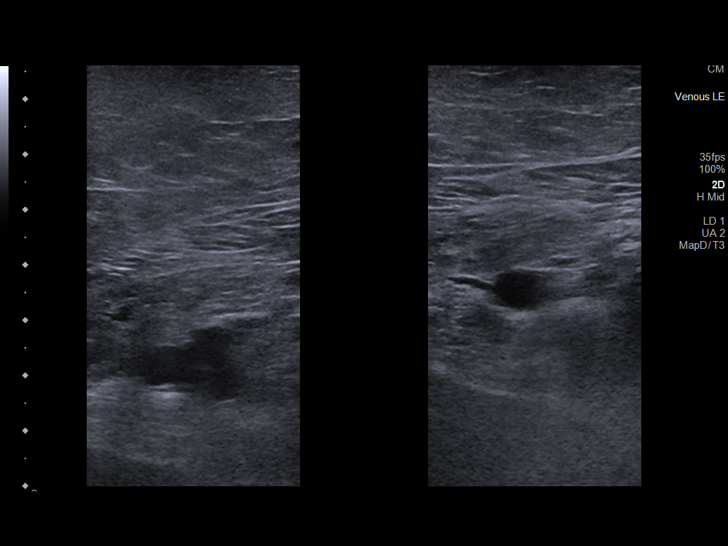
[im 52/63]
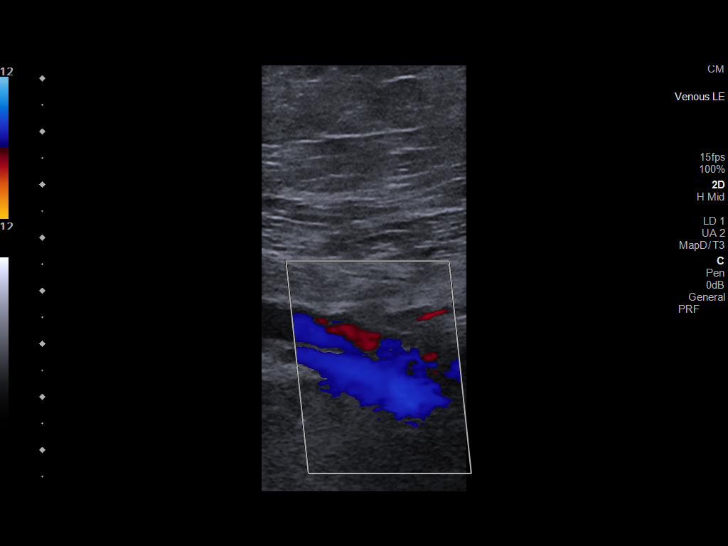
[im 57/63]
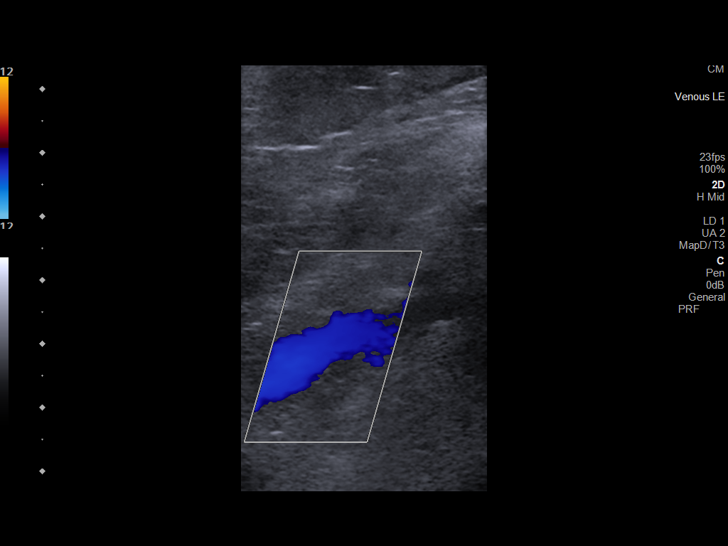
[im 63/63]
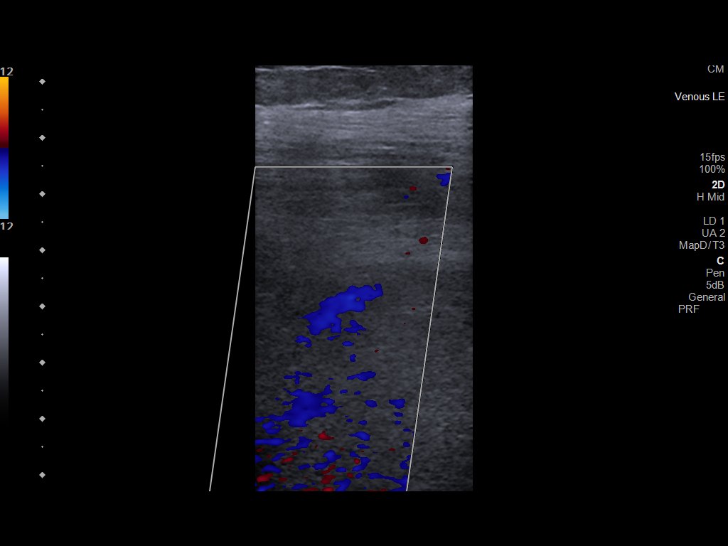

[13 of 24 positions shown; findings below may reference images not displayed]

FINDINGS: RIGHT LOWER EXTREMITY

Common Femoral Vein: No evidence of thrombus. Normal
compressibility, respiratory phasicity and response to augmentation.

Saphenofemoral Junction: No evidence of thrombus. Normal
compressibility and flow on color Doppler imaging.

Profunda Femoral Vein: No evidence of thrombus. Normal
compressibility and flow on color Doppler imaging.

Femoral Vein: No evidence of thrombus. Normal compressibility,
respiratory phasicity and response to augmentation.

Popliteal Vein: No evidence of thrombus. Normal compressibility,
respiratory phasicity and response to augmentation.

Calf Veins: No evidence of thrombus. Normal compressibility and flow
on color Doppler imaging.

Superficial Great Saphenous Vein: No evidence of thrombus. Normal
compressibility.

Venous Reflux:  None.

Other Findings:  None.

LEFT LOWER EXTREMITY

Common Femoral Vein: No evidence of thrombus. Normal
compressibility, respiratory phasicity and response to augmentation.

Saphenofemoral Junction: No evidence of thrombus. Normal
compressibility and flow on color Doppler imaging.

Profunda Femoral Vein: No evidence of thrombus. Normal
compressibility and flow on color Doppler imaging.

Femoral Vein: No evidence of thrombus. Normal compressibility,
respiratory phasicity and response to augmentation.

Popliteal Vein: No evidence of thrombus. Normal compressibility,
respiratory phasicity and response to augmentation.

Calf Veins: No evidence of thrombus. Normal compressibility and flow
on color Doppler imaging.

Superficial Great Saphenous Vein: No evidence of thrombus. Normal
compressibility.

Venous Reflux:  None.

Other Findings:  None.
IMPRESSION: No evidence of deep venous thrombosis in either lower extremity.

## 2021-02-02 DIAGNOSIS — Z6841 Body Mass Index (BMI) 40.0 and over, adult: Secondary | ICD-10-CM | POA: Diagnosis not present

## 2021-02-02 DIAGNOSIS — R001 Bradycardia, unspecified: Secondary | ICD-10-CM | POA: Diagnosis not present

## 2021-02-02 DIAGNOSIS — I87393 Chronic venous hypertension (idiopathic) with other complications of bilateral lower extremity: Secondary | ICD-10-CM | POA: Diagnosis not present

## 2021-02-02 DIAGNOSIS — I872 Venous insufficiency (chronic) (peripheral): Secondary | ICD-10-CM | POA: Diagnosis not present

## 2021-02-02 DIAGNOSIS — E039 Hypothyroidism, unspecified: Secondary | ICD-10-CM | POA: Diagnosis not present

## 2021-02-02 DIAGNOSIS — I4891 Unspecified atrial fibrillation: Secondary | ICD-10-CM | POA: Diagnosis not present

## 2021-02-02 DIAGNOSIS — E782 Mixed hyperlipidemia: Secondary | ICD-10-CM | POA: Diagnosis not present

## 2021-04-13 ENCOUNTER — Other Ambulatory Visit: Payer: Self-pay

## 2021-04-13 ENCOUNTER — Encounter: Payer: Self-pay | Admitting: Cardiology

## 2021-04-13 ENCOUNTER — Ambulatory Visit: Payer: Medicare HMO | Admitting: Cardiology

## 2021-04-13 ENCOUNTER — Ambulatory Visit (INDEPENDENT_AMBULATORY_CARE_PROVIDER_SITE_OTHER): Payer: Medicare HMO

## 2021-04-13 ENCOUNTER — Other Ambulatory Visit (HOSPITAL_COMMUNITY)
Admission: RE | Admit: 2021-04-13 | Discharge: 2021-04-13 | Disposition: A | Discharge status: Home / Self Care | Payer: Medicare HMO | Source: Ambulatory Visit | Attending: Cardiology | Admitting: Cardiology

## 2021-04-13 VITALS — BP 142/94 | HR 79 | Ht 71.0 in | Wt >= 6400 oz

## 2021-04-13 DIAGNOSIS — I10 Essential (primary) hypertension: Secondary | ICD-10-CM

## 2021-04-13 DIAGNOSIS — I4891 Unspecified atrial fibrillation: Secondary | ICD-10-CM

## 2021-04-13 DIAGNOSIS — I4819 Other persistent atrial fibrillation: Secondary | ICD-10-CM

## 2021-04-13 DIAGNOSIS — E782 Mixed hyperlipidemia: Secondary | ICD-10-CM | POA: Diagnosis not present

## 2021-04-13 LAB — BASIC METABOLIC PANEL
Anion gap: 10 (ref 5–15)
BUN: 20 mg/dL (ref 8–23)
CO2: 30 mmol/L (ref 22–32)
Calcium: 9.3 mg/dL (ref 8.9–10.3)
Chloride: 99 mmol/L (ref 98–111)
Creatinine, Ser: 1.15 mg/dL — ABNORMAL HIGH (ref 0.44–1.00)
GFR, Estimated: 53 mL/min — ABNORMAL LOW (ref >60)
Glucose, Bld: 108 mg/dL — ABNORMAL HIGH (ref 70–99)
Potassium: 4.2 mmol/L (ref 3.5–5.1)
Sodium: 139 mmol/L (ref 135–145)

## 2021-04-13 LAB — CBC
HCT: 44.4 % (ref 36.0–46.0)
Hemoglobin: 13.5 g/dL (ref 12.0–15.0)
MCH: 26.9 pg (ref 26.0–34.0)
MCHC: 30.4 g/dL (ref 30.0–36.0)
MCV: 88.6 fL (ref 80.0–100.0)
Platelets: 247 10*3/uL (ref 150–400)
RBC: 5.01 MIL/uL (ref 3.87–5.11)
RDW: 14.2 % (ref 11.5–15.5)
WBC: 8.7 10*3/uL (ref 4.0–10.5)
nRBC: 0 % (ref 0.0–0.2)

## 2021-04-13 NOTE — Progress Notes (Signed)
Cardiology Office Note  Date: 04/13/2021   ID: Angela, [name] Jun 09, 1955, MRN [id number]  PCP:  Sharilyn Sites, MD  Cardiologist:  Rozann Lesches, MD Electrophysiologist:  None   Chief Complaint  Patient presents with   Atrial Fibrillation    History of Present Illness: Angela [name] is a 66 y.o. female referred for cardiology consultation by Mr. Eston Mould with [facility] for evaluation of reported irregular heartbeat and history of atrial fibrillation.  Records indicate previous evaluation by Dr. Bronson Ing in 2018 for atrial fibrillation and heart murmur.  Based on review of available ECGs she was not found to be in atrial fibrillation at that time, but no further work-up in terms of cardiac murmur obtained.  She does not report any sense of palpitations, states that when using a pulse oximeter she sometimes notices slow heart rates in the 40s and 50s, this was more of an issue back in October 2022.  She has not had any sudden dizziness or syncope.  ECG in clinic today confirms atrial fibrillation with old right bundle branch block, heart rate is in the 70s in the absence of any AV nodal blockers.  Echocardiogram in 2018 revealed LVEF 55 to 60% with mild diastolic dysfunction, mild to moderate LVH, mildly dilated right ventricle with normal contraction.  No recent lab work for review.  Her CHA2DS2-VASc score is at least 2.  Past Medical History:  Diagnosis Date   Arthritis    COVID-23 February 2020   Essential hypertension    Hypothyroidism    Mixed hyperlipidemia    Venous insufficiency     Past Surgical History:  Procedure Laterality Date   CARPAL TUNNEL RELEASE Right    CESAREAN SECTION     HERNIA REPAIR      Current Outpatient Medications  Medication Sig Dispense Refill   acetaminophen (TYLENOL) 500 MG tablet Take 1,000 mg by mouth every 6 (six) hours as needed for headache.     Ascorbic Acid (VITAMIN C) 100 MG tablet Take 100 mg by mouth daily.      cholecalciferol (VITAMIN D3) 25 MCG (1000 UNIT) tablet Take 1,000 Units by mouth daily.     levothyroxine (SYNTHROID) 125 MCG tablet Take 125 mcg by mouth daily.     lisinopril-hydrochlorothiazide (ZESTORETIC) 10-12.5 MG tablet Take 1 tablet by mouth daily.     mupirocin ointment (BACTROBAN) 2 % Apply 1 application topically See admin instructions. Use 1 application topically 2 to 3 times daily     simvastatin (ZOCOR) 20 MG tablet Take 20 mg by mouth at bedtime.     albuterol (VENTOLIN HFA) 108 (90 Base) MCG/ACT inhaler Inhale 2 puffs into the lungs every 6 (six) hours as needed for wheezing or shortness of breath. (Patient not taking: Reported on 04/13/2021) 8 g 1   dextromethorphan-guaiFENesin (MUCINEX DM) 30-600 MG 12hr tablet Take 1 tablet by mouth 2 (two) times daily. (Patient not taking: Reported on 04/13/2021) 40 tablet 0   furosemide (LASIX) 40 MG tablet Take 40 mg by mouth daily as needed. (Patient not taking: Reported on 04/13/2021)     predniSONE (DELTASONE) 20 MG tablet Take 3 tablets by mouth daily x1 day; then 2 tablets by mouth daily x2 days; then 1 tablet by mouth daily x3 days; then half tablet by mouth daily x3 days and stop prednisone. (Patient not taking: Reported on 04/13/2021) 12 tablet 0   No current facility-administered medications for this visit.   Allergies:  Patient has  no known allergies.   Social History: The patient  reports that she has never smoked. She has never used smokeless tobacco. She reports that she does not drink alcohol and does not use drugs.   Family History: The patient's family history includes Cancer in her maternal grandfather.   ROS: No chest pain, no syncope.  Physical Exam: VS:  BP (!) 142/94    Pulse 79    Ht 5\' 11"  (1.803 m)    Wt (!) 429 lb (194.6 kg)    SpO2 97%    BMI 59.83 kg/m , BMI Body mass index is 59.83 kg/m.  Wt Readings from Last 3 Encounters:  04/13/21 (!) 429 lb (194.6 kg)  02/10/20 (!) 412 lb 4.2 oz (187 kg)  06/08/16 (!) 414 lb  (187.8 kg)    General: Patient appears comfortable at rest. HEENT: Conjunctiva and lids normal, wearing a mask. Neck: Supple, no elevated JVP or carotid bruits. Lungs: Clear to auscultation, nonlabored breathing at rest. Cardiac: Irregularly irregular, no S3, 2/6 systolic murmur. Abdomen: Morbidly obese with pannus. Extremities: Bilateral lymphedema and venous stasis. Skin: Warm and dry. Musculoskeletal: No kyphosis. Neuropsychiatric: Alert and oriented x3, affect grossly appropriate.  ECG:  An ECG dated 02/10/2020 was personally reviewed today and demonstrated:  Sinus rhythm with right bundle branch block.  Recent Labwork:  December 2021: Potassium 4.1, BUN 18, creatinine 0.81, AST 17, ALT 22, hemoglobin 13.5, platelets 197  Other Studies Reviewed Today:  Echocardiogram 06/13/2016: - Left ventricle: The cavity size was normal. Systolic function was    normal. The estimated ejection fraction was in the range of 55%    to 60%. Wall motion was normal; there were no regional wall    motion abnormalities. Doppler parameters are consistent with    abnormal left ventricular relaxation (grade 1 diastolic    dysfunction). Mild to moderate concentric LVH.  - Ventricular septum: Septal motion showed abnormal function and    dyssynergy. These changes are consistent with intraventricular    conduction delay.  - Aortic valve: Mildly thickened, mildly calcified leaflets. There    was no stenosis.  - Right ventricle: The cavity size was mildly dilated. Systolic    function was normal.  - Right atrium: The atrium was mildly dilated.   Assessment and Plan:  1.  Persistent atrial fibrillation of uncertain duration with CHA2DS2-VASc score of at least 2.  We discussed indications for stroke prophylaxis.  Plan is to obtain CBC and BMET for baseline with consideration of starting Eliquis and then arranging follow-up from there.  We will place a 72-hour Zio patch to better assess heart rate variability,  at this point it does not look like she requires any AV nodal blockers for heart rate control.  The previously described bradycardia with pulse oximeter could have been artifactual, but this needs to be clarified.  2.  Essential hypertension, now on Zestoretic per PCP.  3.  Mixed hyperlipidemia, on Zocor.  4.  Morbid obesity, at risk for OSA which could also be a contributor for problem #1.  Medication Adjustments/Labs and Tests Ordered: Current medicines are reviewed at length with the patient today.  Concerns regarding medicines are outlined above.   Tests Ordered: Orders Placed This Encounter  Procedures   CBC   Basic metabolic panel   LONG TERM MONITOR (3-14 DAYS)   EKG 12-Lead    Medication Changes: No orders of the defined types were placed in this encounter.   Disposition:  Follow up  3 months.  Signed, Satira Sark, MD, [facility] 04/13/2021 3:21 PM    [place] [facility] [address] [place], [place] [address] Phone: [phone]; Fax: [phone]

## 2021-04-13 NOTE — Patient Instructions (Addendum)
Medication Instructions:  Your physician recommends that you continue on your current medications as directed. Please refer to the Current Medication list given to you today.   Labwork: CBC,BMET today   Testing/Procedures: ZIO XT- Long Term Monitor Instructions   Your physician has requested you wear your ZIO patch monitor___3____days.   This is a single patch monitor.  [employer] supplies one patch monitor per enrollment.  Additional stickers are not available.   Please do not apply patch if you will be having a Nuclear Stress Test, Echocardiogram, Cardiac CT, MRI, or Chest Xray during the time frame you would be wearing the monitor. The patch cannot be worn during these tests.  You cannot remove and re-apply the ZIO XT patch monitor.   Do not shower for the first 24 hours     Follow-Up: 3 months  Any Other Special Instructions Will Be Listed Below (If Applicable).  If you need a refill on your cardiac medications before your next appointment, please call your pharmacy.

## 2021-04-13 NOTE — Addendum Note (Signed)
Addended by: Barbarann Ehlers A on: 04/13/2021 03:25 PM   Modules accepted: Orders

## 2021-04-16 ENCOUNTER — Telehealth: Payer: Self-pay

## 2021-04-16 DIAGNOSIS — I4891 Unspecified atrial fibrillation: Secondary | ICD-10-CM | POA: Diagnosis not present

## 2021-04-16 MED ORDER — APIXABAN 5 MG PO TABS: 5.0000 mg | ORAL_TABLET | Freq: Two times a day (BID) | ORAL | 3 refills | Status: DC

## 2021-04-16 NOTE — Telephone Encounter (Signed)
Lab  results discussed with patient.She agrees to start Eliquis 5 mg BID.She requests pharmacy as [employer] order.   30 day FREE Trial code sent, [id number], PCN:[id number], RFV:[id number], HE:[id number]     Samples 5 mg #28 given, Lot: [id number], exp 05/2023

## 2021-04-16 NOTE — Telephone Encounter (Signed)
-----   Message from Satira Sark, MD sent at 04/13/2021  5:08 PM EST ----- Results reviewed.  Based on follow-up lab work she should be able to start Eliquis 5 mg twice daily for stroke prophylaxis.  We discussed this today in clinic, would send in prescription and keep follow-up in 3 months at which point we should get a repeat CBC and BMET.

## 2021-04-19 ENCOUNTER — Telehealth: Payer: Self-pay | Admitting: Cardiology

## 2021-04-19 NOTE — Telephone Encounter (Signed)
Patient would like a call back to review her medication list and clarify which medications she should be taking.

## 2021-04-19 NOTE — Telephone Encounter (Signed)
Pt called to say that she stopped taking flax seed oil d/t being on Eliquis.

## 2021-04-21 DIAGNOSIS — I4891 Unspecified atrial fibrillation: Secondary | ICD-10-CM | POA: Diagnosis not present

## 2021-07-04 NOTE — Progress Notes (Signed)
? ? ?Cardiology Office Note ? ?Date: 07/11/2021  ? ?ID: Angela [name], DOB June 20, 1955, MRN [id number] ? ?PCP:  Sharilyn Sites, MD  ?Cardiologist:  Rozann Lesches, MD ?Electrophysiologist:  None  ? ?Chief Complaint  ?Patient presents with  ? Atrial Fibrillation  ? ? ?History of Present Illness: ?Angela [name] is a 66 y.o. female with persistent atrial fibrillation, hypertension, hyperlipidemia, morbid obesity who presents for follow-up.  She follows with Dr. Domenic Polite, last seen on 04/13/2021.  She had been referred for cardiology consultation by Mr. Eston Mould with [facility] for evaluation of reported irregular heartbeat and history of atrial fibrillation.  Records indicate previous evaluation by Dr. Bronson Ing in 2018 for atrial fibrillation and heart murmur.  Based on review of available ECGs she was not found to be in atrial fibrillation at that time, but no further work-up in terms of cardiac murmur obtained.  EKG at clinic visit 04/13/2021 showed atrial fibrillation with old right bundle branch block, heart rate is in the 70s in the absence of any AV nodal blockers.  Echocardiogram in 2018 revealed LVEF 55 to 60% with mild diastolic dysfunction, mild to moderate LVH, mildly dilated right ventricle with normal contraction. ? ?Zio patch x2 days 04/2021 showed 100% A-fib burden, average heart rate in 70s.  Had 3-second pause, did not report symptoms. ? ?Since last clinic visit, she reports that she has been doing okay.  Denies any chest pain, lightheadedness, or syncope.  Reports rare palpitations.  She has been feeling short of breath with minimal exertion.  Reports chronic lower extremity edema.  Denies any bleeding issues.  Reports was diagnosed with OSA in the past but has not been on CPAP for 5 years. ? ? ? ?BP Readings from Last 3 Encounters:  ?07/07/21 (!) 142/68  ?04/13/21 (!) 142/94  ?02/14/20 (!) 152/82  ? ? ? ? ?Past Medical History:  ?Diagnosis Date  ? Arthritis   ? COVID-19   ? December 2021  ? Essential  hypertension   ? Hypothyroidism   ? Mixed hyperlipidemia   ? Venous insufficiency   ? ? ?Past Surgical History:  ?Procedure Laterality Date  ? CARPAL TUNNEL RELEASE Right   ? CESAREAN SECTION    ? HERNIA REPAIR    ? ? ?Current Outpatient Medications  ?Medication Sig Dispense Refill  ? acetaminophen (TYLENOL) 500 MG tablet Take 1,000 mg by mouth every 6 (six) hours as needed for headache.    ? albuterol (VENTOLIN HFA) 108 (90 Base) MCG/ACT inhaler Inhale 2 puffs into the lungs every 6 (six) hours as needed for wheezing or shortness of breath. 8 g 1  ? apixaban (ELIQUIS) 5 MG TABS tablet Take 1 tablet (5 mg total) by mouth 2 (two) times daily. 180 tablet 3  ? Ascorbic Acid (VITAMIN C) 100 MG tablet Take 100 mg by mouth daily.    ? cholecalciferol (VITAMIN D3) 25 MCG (1000 UNIT) tablet Take 1,000 Units by mouth daily.    ? dextromethorphan-guaiFENesin (MUCINEX DM) 30-600 MG 12hr tablet Take 1 tablet by mouth 2 (two) times daily. 40 tablet 0  ? furosemide (LASIX) 40 MG tablet Take 40 mg by mouth daily as needed.    ? levothyroxine (SYNTHROID) 125 MCG tablet Take 125 mcg by mouth daily.    ? lisinopril-hydrochlorothiazide (ZESTORETIC) 10-12.5 MG tablet Take 1 tablet by mouth daily.    ? mupirocin ointment (BACTROBAN) 2 % Apply 1 application topically See admin instructions. Use 1 application topically 2 to 3 times daily    ?  predniSONE (DELTASONE) 20 MG tablet Take 3 tablets by mouth daily x1 day; then 2 tablets by mouth daily x2 days; then 1 tablet by mouth daily x3 days; then half tablet by mouth daily x3 days and stop prednisone. 12 tablet 0  ? simvastatin (ZOCOR) 20 MG tablet Take 20 mg by mouth at bedtime.    ? ?No current facility-administered medications for this visit.  ? ?Allergies:  Patient has no known allergies.  ? ?Social History: The patient  reports that she has never smoked. She has never used smokeless tobacco. She reports that she does not drink alcohol and does not use drugs.  ? ?Family History: The  patient's family history includes Cancer in her maternal grandfather.  ? ?ROS: No chest pain, no syncope. ? ?Physical Exam: ?VS:  BP (!) 142/68   Pulse 90   Ht '5\' 11"'$  (1.803 m)   Wt (!) 431 lb 3.2 oz (195.6 kg)   SpO2 93%   BMI 60.14 kg/m? , BMI Body mass index is 60.14 kg/m?. ? ?Wt Readings from Last 3 Encounters:  ?07/07/21 (!) 431 lb 3.2 oz (195.6 kg)  ?04/13/21 (!) 429 lb (194.6 kg)  ?02/10/20 (!) 412 lb 4.2 oz (187 kg)  ?  ?General: Patient appears comfortable at rest. ?HEENT: Conjunctiva and lids normal, wearing a mask. ?Neck: Supple, no elevated JVP or carotid bruits. ?Lungs: Clear to auscultation, nonlabored breathing at rest. ?Cardiac: Irregularly irregular, no S3, 2/6 systolic murmur. ?Abdomen: Morbidly obese with pannus. ?Extremities: Bilateral lymphedema and venous stasis. ?Skin: Warm and dry. ?Musculoskeletal: No kyphosis. ?Neuropsychiatric: Alert and oriented x3, affect grossly appropriate. ? ?ECG:   ?07/07/21: Afib, rate 90, RBBB ? ?Recent Labwork: ? ?December 2021: Potassium 4.1, BUN 18, creatinine 0.81, AST 17, ALT 22, hemoglobin 13.5, platelets 197 ? ?Other Studies Reviewed Today: ? ?Echocardiogram 06/13/2016: ?- Left ventricle: The cavity size was normal. Systolic function was  ?  normal. The estimated ejection fraction was in the range of 55%  ?  to 60%. Wall motion was normal; there were no regional wall  ?  motion abnormalities. Doppler parameters are consistent with  ?  abnormal left ventricular relaxation (grade 1 diastolic  ?  dysfunction). Mild to moderate concentric LVH.  ?- Ventricular septum: Septal motion showed abnormal function and  ?  dyssynergy. These changes are consistent with intraventricular  ?  conduction delay.  ?- Aortic valve: Mildly thickened, mildly calcified leaflets. There  ?  was no stenosis.  ?- Right ventricle: The cavity size was mildly dilated. Systolic  ?  function was normal.  ?- Right atrium: The atrium was mildly dilated.  ? ?Assessment and Plan: ? ?1.   Persistent atrial fibrillation of uncertain duration with CHA2DS2-VASc score of at least 2.  Echocardiogram in 2018 revealed LVEF 55 to 60% with mild diastolic dysfunction, mild to moderate LVH, mildly dilated right ventricle with normal contraction.  Zio patch x2 days 04/2021 showed 100% A-fib burden, average heart rate in 70s.  Had 3-second pause, did not report symptoms. ?-Rates well controlled despite no AV nodal blockers ?-Continue Eliquis 5 mg twice daily ?-Echocardiogram ?-Suspect untreated OSA has led to her A-fib.  Given new A-fib ideally would try to restore sinus rhythm.  However suspect unlikely to maintain sinus rhythm until her OSA is treated.  Will refer to pulmonology ? ?2.  Essential hypertension, on Zestoretic per PCP. ? ?3.  Mixed hyperlipidemia, on Zocor. ? ?4.  Morbid obesity: Body mass index is 60.14 kg/m?. ? ?5.  OSA: Reports was diagnosed with OSA but has been off CPAP for over 5 years.  Suspect this has led to her A-fib.  Will refer to pulmonology for sleep study ? ? ?Medication Adjustments/Labs and Tests Ordered: ?Current medicines are reviewed at length with the patient today.  Concerns regarding medicines are outlined above.  ? ?Tests Ordered: ?Orders Placed This Encounter  ?Procedures  ? Ambulatory referral to Pulmonology  ? EKG 12-Lead  ? ECHOCARDIOGRAM COMPLETE  ? ? ?Medication Changes: ?No orders of the defined types were placed in this encounter. ? ? ?Disposition:  Follow up  3 months. ? ?Signed, ?Donato Heinz, MD ? ?

## 2021-07-07 ENCOUNTER — Encounter: Payer: Self-pay | Admitting: Cardiology

## 2021-07-07 ENCOUNTER — Ambulatory Visit: Payer: Medicare HMO | Admitting: Cardiology

## 2021-07-07 VITALS — BP 142/68 | HR 90 | Ht 71.0 in | Wt >= 6400 oz

## 2021-07-07 DIAGNOSIS — I10 Essential (primary) hypertension: Secondary | ICD-10-CM

## 2021-07-07 DIAGNOSIS — G4733 Obstructive sleep apnea (adult) (pediatric): Secondary | ICD-10-CM | POA: Diagnosis not present

## 2021-07-07 DIAGNOSIS — I4819 Other persistent atrial fibrillation: Secondary | ICD-10-CM

## 2021-07-07 DIAGNOSIS — E782 Mixed hyperlipidemia: Secondary | ICD-10-CM

## 2021-07-07 DIAGNOSIS — G4730 Sleep apnea, unspecified: Secondary | ICD-10-CM

## 2021-07-07 DIAGNOSIS — I4891 Unspecified atrial fibrillation: Secondary | ICD-10-CM

## 2021-07-07 NOTE — Patient Instructions (Signed)
Medication Instructions:  ?Your physician recommends that you continue on your current medications as directed. Please refer to the Current Medication list given to you today. ? ?*If you need a refill on your cardiac medications before your next appointment, please call your pharmacy* ? ? ?Lab Work: ?NONE  ? ?If you have labs (blood work) drawn today and your tests are completely normal, you will receive your results only by: ?MyChart Message (if you have MyChart) OR ?A paper copy in the mail ?If you have any lab test that is abnormal or we need to change your treatment, we will call you to review the results. ? ? ?Testing/Procedures: ?Your physician has requested that you have an echocardiogram. Echocardiography is a painless test that uses sound waves to create images of your heart. It provides your doctor with information about the size and shape of your heart and how well your heart?s chambers and valves are working. This procedure takes approximately one hour. There are no restrictions for this procedure. ? ? ? ?Follow-Up: ?At [facility], you and your health needs are our priority.  As part of our continuing mission to provide you with exceptional heart care, we have created designated Provider Care Teams.  These Care Teams include your primary Cardiologist (physician) and Advanced Practice Providers (APPs -  Physician Assistants and Nurse Practitioners) who all work together to provide you with the care you need, when you need it. ? ?We recommend signing up for the patient portal called "MyChart".  Sign up information is provided on this After Visit Summary.  MyChart is used to connect with patients for Virtual Visits (Telemedicine).  Patients are able to view lab/test results, encounter notes, upcoming appointments, etc.  Non-urgent messages can be sent to your provider as well.   ?To learn more about what you can do with MyChart, go to [email or web address].   ? ?Your next appointment:   ?3  month(s) ? ?The format for your next appointment:   ?In Person ? ?Provider:   ?You may see Rozann Lesches, MD or one of the following Advanced Practice Providers on your designated Care Team:   ?Bernerd Pho, PA-C  ?Ermalinda Barrios, PA-C   ? ? ?Other Instructions ?Thank you for choosing [facility]! ? ? ? ?Important Information About [employer] ? ? ? ? ? ? ?

## 2021-07-12 ENCOUNTER — Ambulatory Visit: Payer: Medicare HMO | Admitting: Physician Assistant

## 2021-07-20 ENCOUNTER — Ambulatory Visit (HOSPITAL_COMMUNITY)
Admission: RE | Admit: 2021-07-20 | Discharge: 2021-07-20 | Disposition: A | Discharge status: Home / Self Care | Payer: Medicare HMO | Source: Ambulatory Visit | Attending: Cardiology | Admitting: Cardiology

## 2021-07-20 DIAGNOSIS — I4819 Other persistent atrial fibrillation: Secondary | ICD-10-CM | POA: Diagnosis not present

## 2021-07-20 LAB — ECHOCARDIOGRAM COMPLETE
AR max vel: 1.84 cm2
AV Area VTI: 1.7 cm2
AV Area mean vel: 1.82 cm2
AV Mean grad: 6.9 mmHg
AV Peak grad: 13 mmHg
Ao pk vel: 1.8 m/s
Area-P 1/2: 3.77 cm2
S' Lateral: 4.2 cm

## 2021-07-20 NOTE — Progress Notes (Signed)
*  PRELIMINARY RESULTS* ?Echocardiogram ?2D Echocardiogram has been performed. ? ?Angela [name] ?07/20/2021, 3:59 PM ?

## 2021-07-23 ENCOUNTER — Other Ambulatory Visit (HOSPITAL_COMMUNITY): Payer: Medicare HMO

## 2021-08-04 DIAGNOSIS — G5600 Carpal tunnel syndrome, unspecified upper limb: Secondary | ICD-10-CM | POA: Diagnosis not present

## 2021-08-04 DIAGNOSIS — Z6841 Body Mass Index (BMI) 40.0 and over, adult: Secondary | ICD-10-CM | POA: Diagnosis not present

## 2021-08-12 ENCOUNTER — Ambulatory Visit (INDEPENDENT_AMBULATORY_CARE_PROVIDER_SITE_OTHER): Payer: Medicare HMO | Admitting: Pulmonary Disease

## 2021-08-12 ENCOUNTER — Encounter: Payer: Self-pay | Admitting: Pulmonary Disease

## 2021-08-12 VITALS — BP 134/88 | HR 92 | Temp 98.0°F | Ht 71.0 in | Wt >= 6400 oz

## 2021-08-12 DIAGNOSIS — R0683 Snoring: Secondary | ICD-10-CM

## 2021-08-12 NOTE — Patient Instructions (Signed)
Will arrange for home sleep study Will call to arrange for follow up after sleep study reviewed  

## 2021-08-12 NOTE — Progress Notes (Signed)
[facility]  Chief Complaint  Patient presents with   Consult    Sleep consult Has a hard time falling asleep and staying asleep.     Past Surgical History:  She  has a past surgical history that includes Cesarean section; Hernia repair; and Carpal tunnel release (Right).  Past Medical History:  COVID 19 PNA December 2021, OA, HTN, Hypothyroidism, HLD, Persistent atrial fibrillation  Constitutional:  BP 134/88 (BP Location: Left Wrist, Patient Position: Sitting)   Pulse 92   Temp 98 F (36.7 C) (Temporal)   Ht '5\' 11"'$  (1.803 m)   Wt (!) 441 lb 3.2 oz (200.1 kg)   SpO2 97% Comment: ra  BMI 61.53 kg/m   Brief Summary:  Angela [name] is a 66 y.o. female with snoring.      Subjective:   She is followed by cardiology for atrial fibrillation.  She had a sleep study years ago and was on CPAP.  Her machine broke, and she never got it replaced.  She snores and wakes up feeling choked.  She can't sleep on her back. Gets leg cramps at night.  Falls asleep while reading.    She goes to sleep at 10 pm.  She falls asleep in about an hour.  She wakes up several times to use the bathroom.  She gets out of bed at 9 am.  She feels tired in the morning.  She denies morning headache.  She does not use anything to help her fall sleep or stay awake.  She denies sleep walking, sleep talking, bruxism, or nightmares.  There is no history of restless legs.  She denies sleep hallucinations, sleep paralysis, or cataplexy.  The Epworth score is 13 out of 24.   Physical Exam:   Appearance - well kempt   ENMT - no sinus tenderness, no oral exudate, no LAN, Mallampati 3 airway, no stridor  Respiratory - equal breath sounds bilaterally, no wheezing or rales  CV - irregular  Ext - 1+ edema  Skin - no rashes  Psych - normal mood and affect   Sleep Tests:    Cardiac Tests:  Echo 07/20/21 >> EF 55 to 60%, mild LVH  Social History:  She   reports that she has never smoked. She has never used smokeless tobacco. She reports that she does not drink alcohol and does not use drugs.  Family History:  Her family history includes Cancer in her maternal grandfather.    Discussion:  She snoring, sleep disruption, apnea, daytime sleepiness, and nocturnal leg cramps.  She has history of atrial fibrillation and hypertension.  Her BMI is 61.53.  She has prior history of sleep apnea.  I suspect she still has significant sleep apnea.  Assessment/Plan:   Snoring with excessive daytime sleepiness. - will need to arrange for a home sleep study  Obesity. - discussed how weight can impact sleep and risk for sleep disordered breathing - discussed options to assist with weight loss: combination of diet modification, cardiovascular and strength training exercises  Cardiovascular risk. - had an extensive discussion regarding the adverse health consequences related to untreated sleep disordered breathing - specifically discussed the risks for hypertension, coronary artery disease, cardiac dysrhythmias, cerebrovascular disease, and diabetes - lifestyle modification discussed  Safe driving practices. - discussed how sleep disruption can increase risk of accidents, particularly when driving - safe driving practices were discussed  Therapies for obstructive sleep apnea. - if the sleep study shows significant sleep  apnea, then various therapies for treatment were reviewed: CPAP, oral appliance, and surgical interventions  Time Spent Involved in Patient Care on Day of Examination:  35 minutes  Follow up:   Patient Instructions  Will arrange for home sleep study Will call to arrange for follow up after sleep study reviewed   Medication List:   Allergies as of 08/12/2021   No Known Allergies      Medication List        Accurate as of August 12, 2021 11:49 AM. If you have any questions, ask your nurse or doctor.          STOP taking  these medications    predniSONE 20 MG tablet Commonly known as: DELTASONE Stopped by: Chesley Mires, MD       TAKE these medications    acetaminophen 500 MG tablet Commonly known as: TYLENOL Take 1,000 mg by mouth every 6 (six) hours as needed for headache.   albuterol 108 (90 Base) MCG/ACT inhaler Commonly known as: VENTOLIN HFA Inhale 2 puffs into the lungs every 6 (six) hours as needed for wheezing or shortness of breath.   apixaban 5 MG Tabs tablet Commonly known as: ELIQUIS Take 1 tablet (5 mg total) by mouth 2 (two) times daily.   celecoxib 200 MG capsule Commonly known as: CELEBREX Take 200 mg by mouth 2 (two) times daily as needed.   cholecalciferol 25 MCG (1000 UNIT) tablet Commonly known as: VITAMIN D3 Take 1,000 Units by mouth daily.   dextromethorphan-guaiFENesin 30-600 MG 12hr tablet Commonly known as: MUCINEX DM Take 1 tablet by mouth 2 (two) times daily.   furosemide 40 MG tablet Commonly known as: LASIX Take 40 mg by mouth daily as needed.   levothyroxine 125 MCG tablet Commonly known as: SYNTHROID Take 125 mcg by mouth daily.   lisinopril-hydrochlorothiazide 10-12.5 MG tablet Commonly known as: ZESTORETIC Take 1 tablet by mouth daily.   mupirocin ointment 2 % Commonly known as: BACTROBAN Apply 1 application topically See admin instructions. Use 1 application topically 2 to 3 times daily   simvastatin 20 MG tablet Commonly known as: ZOCOR Take 20 mg by mouth at bedtime.   vitamin C 100 MG tablet Take 100 mg by mouth daily.        Signature:  Chesley Mires, MD [place] Pager - [phone] 08/12/2021, 11:49 AM

## 2021-09-01 ENCOUNTER — Institutional Professional Consult (permissible substitution): Payer: Medicare HMO | Admitting: Pulmonary Disease

## 2021-09-14 DIAGNOSIS — M79676 Pain in unspecified toe(s): Secondary | ICD-10-CM | POA: Diagnosis not present

## 2021-09-14 DIAGNOSIS — L84 Corns and callosities: Secondary | ICD-10-CM | POA: Diagnosis not present

## 2021-09-14 DIAGNOSIS — B351 Tinea unguium: Secondary | ICD-10-CM | POA: Diagnosis not present

## 2021-09-14 DIAGNOSIS — I70203 Unspecified atherosclerosis of native arteries of extremities, bilateral legs: Secondary | ICD-10-CM | POA: Diagnosis not present

## 2021-10-01 DIAGNOSIS — I4891 Unspecified atrial fibrillation: Secondary | ICD-10-CM | POA: Diagnosis not present

## 2021-10-01 DIAGNOSIS — B372 Candidiasis of skin and nail: Secondary | ICD-10-CM | POA: Diagnosis not present

## 2021-10-01 DIAGNOSIS — E039 Hypothyroidism, unspecified: Secondary | ICD-10-CM | POA: Diagnosis not present

## 2021-10-01 DIAGNOSIS — E6609 Other obesity due to excess calories: Secondary | ICD-10-CM | POA: Diagnosis not present

## 2021-10-01 DIAGNOSIS — Z6841 Body Mass Index (BMI) 40.0 and over, adult: Secondary | ICD-10-CM | POA: Diagnosis not present

## 2021-10-06 NOTE — Progress Notes (Unsigned)
Cardiology Office Note    Date:  10/07/2021   ID:  Angela [name], Angela [name] 1955/05/24, MRN [id number]  PCP:  Angela Sites, MD  Cardiologist: Angela Lesches, MD    Chief Complaint  Patient presents with   Follow-up    3 month visit    History of Present Illness:    Angela [name] is a 66 y.o. female with past medical history of persistent atrial fibrillation, HTN, HLD and obesity who presents to the office today for 70-monthfollow-up.  She was last examined by Dr. SGardiner [name] 07/2021 and denied any recent chest pain or progressive dyspnea at that time. She was experiencing occasional palpitations. Rates were well controlled despite the not being on AV nodal blocking agents and she was continued on Eliquis for anticoagulation. She was referred to Pulmonology for evaluation of OSA and a follow-up echocardiogram was also recommended. Echo showed a preserved EF of 55 to 60% with no regional wall motion abnormalities. She did not have any significant valve abnormalities. She was evaluated by Pulmonology on 08/12/2021 with a home sleep study recommended but I cannot see where this was performed.  In talking with the patient today, she reports overall doing well since her last office visit. She does have baseline dyspnea on exertion but denies any acute changes in this. No recent chest pain. She does experience intermittent palpitations but reports these are typically most notable at night. Says her heart rate varies at home from the 40's to low 100's. She remains on Eliquis for anticoagulation and denies any evidence of active bleeding. She does report orthopnea and was previously evaluated by Pulmonology but says a home sleep study has not yet been arranged as they were awaiting insurance authorization but she never heard back regarding this.    Past Medical History:  Diagnosis Date   Arthritis    COVID-23 February 2020   Essential hypertension    Hypothyroidism    Mixed hyperlipidemia     Persistent atrial fibrillation (HCC)    Venous insufficiency     Past Surgical History:  Procedure Laterality Date   CARPAL TUNNEL RELEASE Right    CESAREAN SECTION     HERNIA REPAIR      Current Medications: Outpatient Medications Prior to Visit  Medication Sig Dispense Refill   acetaminophen (TYLENOL) 500 MG tablet Take 1,000 mg by mouth every 6 (six) hours as needed for headache.     albuterol (VENTOLIN HFA) 108 (90 Base) MCG/ACT inhaler Inhale 2 puffs into the lungs every 6 (six) hours as needed for wheezing or shortness of breath. 8 g 1   apixaban (ELIQUIS) 5 MG TABS tablet Take 1 tablet (5 mg total) by mouth 2 (two) times daily. 180 tablet 3   Ascorbic Acid (VITAMIN C) 100 MG tablet Take 100 mg by mouth daily.     celecoxib (CELEBREX) 200 MG capsule Take 200 mg by mouth 2 (two) times daily as needed.     cholecalciferol (VITAMIN D3) 25 MCG (1000 UNIT) tablet Take 1,000 Units by mouth daily.     dextromethorphan-guaiFENesin (MUCINEX DM) 30-600 MG 12hr tablet Take 1 tablet by mouth 2 (two) times daily. 40 tablet 0   fluconazole (DIFLUCAN) 100 MG tablet Take 100 mg by mouth daily.     furosemide (LASIX) 40 MG tablet Take 40 mg by mouth daily as needed.     levothyroxine (SYNTHROID) 125 MCG tablet Take 125 mcg by mouth daily.     lisinopril-hydrochlorothiazide (  ZESTORETIC) 10-12.5 MG tablet Take 1 tablet by mouth daily.     mupirocin ointment (BACTROBAN) 2 % Apply 1 application topically See admin instructions. Use 1 application topically 2 to 3 times daily     NYSTATIN powder Apply topically 3 (three) times daily.     simvastatin (ZOCOR) 20 MG tablet Take 20 mg by mouth at bedtime.     No facility-administered medications prior to visit.     Allergies:   Patient has no known allergies.   Social History   Socioeconomic History   Marital status: Married    Spouse name: Not on file   Number of children: Not on file   Years of education: Not on file   Highest education  level: Not on file  Occupational History   Not on file  Tobacco Use   Smoking status: Never   Smokeless tobacco: Never  Vaping Use   Vaping Use: Never used  Substance and Sexual Activity   Alcohol use: No   Drug use: No   Sexual activity: Not on file  Other Topics Concern   Not on file  Social History Narrative   Not on file   Social Determinants of Health   Financial Resource Strain: Not on file  Food Insecurity: Not on file  Transportation Needs: Not on file  Physical Activity: Not on file  Stress: Not on file  Social Connections: Not on file     Family History:  The patient's family history includes Cancer in her maternal grandfather.   Review of Systems:    Please see the history of present illness.     All other systems reviewed and are otherwise negative except as noted above.   Physical Exam:    VS:  BP 132/78   Pulse 65   Ht '5\' 11"'$  (1.803 m)   Wt (!) 441 lb 6.4 oz (200.2 kg)   SpO2 96%   BMI 61.56 kg/m    General: Pleasant, obese female appearing in no acute distress. Head: Normocephalic, atraumatic. Neck: No carotid bruits. JVD not elevated.  Lungs: Respirations regular and unlabored, without wheezes or rales.  Heart: Irregular irregular. No S3 or S4.  No murmur, no rubs, or gallops appreciated. Abdomen: Appears non-distended. No obvious abdominal masses. Msk:  Strength and tone appear normal for age. No obvious joint deformities or effusions. Extremities: No clubbing or cyanosis. No pitting edema.  Distal pedal pulses are 2+ bilaterally. Neuro: Alert and oriented X 3. Moves all extremities spontaneously. No focal deficits noted. Psych:  Responds to questions appropriately with a normal affect. Skin: No rashes or lesions noted  Wt Readings from Last 3 Encounters:  10/07/21 (!) 441 lb 6.4 oz (200.2 kg)  08/12/21 (!) 441 lb 3.2 oz (200.1 kg)  07/07/21 (!) 431 lb 3.2 oz (195.6 kg)     Studies/Labs Reviewed:   EKG:  EKG is not ordered today.    Recent Labs: 04/13/2021: BUN 20; Creatinine, Ser 1.15; Hemoglobin 13.5; Platelets 247; Potassium 4.2; Sodium 139   Lipid Panel    Component Value Date/Time   TRIG 126 02/10/2020 2030    Additional studies/ records that were reviewed today include:   Event Monitor: 04/2021 ZIO XT reviewed.  2 days, 7 hours analyzed.  Predominant rhythm is atrial fibrillation with 100% rhythm burden during monitoring period.  Over 90% of the time the patient's heart rate was adequately controlled, average heart rate was in the 70s.  There were brief episodes of significant  RVR. A  3-second pause was noted on February 9 at 9:26 AM, did not result in patient triggered event.  There were otherwise rare PVCs and ventricular couplets representing less than 1% total beats.  Echocardiogram: 07/20/2021 IMPRESSIONS     1. Left ventricular ejection fraction, by estimation, is 55 to 60%. The  left ventricle has normal function. Left ventricular endocardial border  not optimally defined to evaluate regional wall motion. There is mild left  ventricular hypertrophy. Left  ventricular diastolic parameters are indeterminate.   2. RV is not well visualized. Grossly it appears mild to moderately  enlarged with normal systolic function. . Right ventricular systolic  function was not well visualized. The right ventricular size is not well  visualized. Tricuspid regurgitation signal  is inadequate for assessing PA pressure.   3. Left atrial size was mildly dilated.   4. The mitral valve is normal in structure. No evidence of mitral valve  regurgitation. No evidence of mitral stenosis.   5. The aortic valve is tricuspid. Aortic valve regurgitation is not  visualized. No aortic stenosis is present.   6. The inferior vena cava is dilated in size with >50% respiratory  variability, suggesting right atrial pressure of 8 mmHg.   Assessment:    1. Persistent atrial fibrillation ([place])   2. Essential hypertension   3.  Mixed hyperlipidemia   4. Sleep-disordered breathing      Plan:   In order of problems listed above:  1. Permanent Atrial Fibrillation - Her heart rate has overall been well controlled at home and recent monitor earlier this year showed her heart rate was adequately controlled 90% of the time. She has not been on AV nodal blocking agents given intermittent bradycardia with heart rate in the 40's. We did review the importance of trying to limit her caffeine intake. - No reports of active bleeding. Continue Eliquis 5 mg twice daily for anticoagulation.  2. HTN - Blood pressures is well-controlled at 132/78 during today's visit. Continue current medical therapy with Lisinopril-HCTZ 10-12.5 mg daily. She is listed as taking Lasix 40 mg as needed but says she has not utilized this in several months.  3. HLD - Followed by her PCP. She has remained on Simvastatin 20 mg daily.  4. Sleep Disordered Breathing - She does report frequent snoring and daytime somnolence. Was previously referred to Pulmonology with plans to have a home sleep study by review of notes but never heard back regarding this. I will send a note to Pulmonology today so they can hopefully reach out to the patient and have this arranged.   Medication Adjustments/Labs and Tests Ordered: Current medicines are reviewed at length with the patient today.  Concerns regarding medicines are outlined above.  Medication changes, Labs and Tests ordered today are listed in the Patient Instructions below. Patient Instructions  Medication Instructions:  Your physician recommends that you continue on your current medications as directed. Please refer to the Current Medication list given to you today.   Labwork: None today  Testing/Procedures: None today  Follow-Up: 6 months Dr.McDowell  Any Other Special Instructions Will Be Listed Below (If Applicable).  If you need a refill on your cardiac medications before your next  appointment, please call your pharmacy.    Signed, Angela Heritage, Angela [name]  10/07/2021 5:30 PM    [facility] [address] [place], [place] [address] Phone: [phone] Fax: [phone]

## 2021-10-07 ENCOUNTER — Ambulatory Visit: Payer: Medicare HMO | Admitting: Student

## 2021-10-07 ENCOUNTER — Encounter: Payer: Self-pay | Admitting: Student

## 2021-10-07 VITALS — BP 132/78 | HR 65 | Ht 71.0 in | Wt >= 6400 oz

## 2021-10-07 DIAGNOSIS — E782 Mixed hyperlipidemia: Secondary | ICD-10-CM | POA: Diagnosis not present

## 2021-10-07 DIAGNOSIS — G4730 Sleep apnea, unspecified: Secondary | ICD-10-CM | POA: Diagnosis not present

## 2021-10-07 DIAGNOSIS — I4819 Other persistent atrial fibrillation: Secondary | ICD-10-CM

## 2021-10-07 DIAGNOSIS — I10 Essential (primary) hypertension: Secondary | ICD-10-CM | POA: Diagnosis not present

## 2021-10-07 NOTE — Patient Instructions (Signed)
Medication Instructions:  Your physician recommends that you continue on your current medications as directed. Please refer to the Current Medication list given to you today.   Labwork: None today  Testing/Procedures: None today  Follow-Up: 6 months Dr.McDowell  Any Other Special Instructions Will Be Listed Below (If Applicable).  If you need a refill on your cardiac medications before your next appointment, please call your pharmacy.  

## 2021-10-15 DIAGNOSIS — Z6841 Body Mass Index (BMI) 40.0 and over, adult: Secondary | ICD-10-CM | POA: Diagnosis not present

## 2021-10-15 DIAGNOSIS — E559 Vitamin D deficiency, unspecified: Secondary | ICD-10-CM | POA: Diagnosis not present

## 2021-10-15 DIAGNOSIS — E039 Hypothyroidism, unspecified: Secondary | ICD-10-CM | POA: Diagnosis not present

## 2021-10-15 DIAGNOSIS — D518 Other vitamin B12 deficiency anemias: Secondary | ICD-10-CM | POA: Diagnosis not present

## 2021-10-15 DIAGNOSIS — B372 Candidiasis of skin and nail: Secondary | ICD-10-CM | POA: Diagnosis not present

## 2021-10-15 DIAGNOSIS — Z1331 Encounter for screening for depression: Secondary | ICD-10-CM | POA: Diagnosis not present

## 2021-10-15 DIAGNOSIS — D696 Thrombocytopenia, unspecified: Secondary | ICD-10-CM | POA: Diagnosis not present

## 2021-10-15 DIAGNOSIS — I87393 Chronic venous hypertension (idiopathic) with other complications of bilateral lower extremity: Secondary | ICD-10-CM | POA: Diagnosis not present

## 2021-10-15 DIAGNOSIS — E538 Deficiency of other specified B group vitamins: Secondary | ICD-10-CM | POA: Diagnosis not present

## 2021-10-15 DIAGNOSIS — Z0001 Encounter for general adult medical examination with abnormal findings: Secondary | ICD-10-CM | POA: Diagnosis not present

## 2021-10-15 DIAGNOSIS — I4891 Unspecified atrial fibrillation: Secondary | ICD-10-CM | POA: Diagnosis not present

## 2021-10-29 ENCOUNTER — Ambulatory Visit: Payer: Medicare HMO

## 2021-10-29 DIAGNOSIS — R0683 Snoring: Secondary | ICD-10-CM | POA: Diagnosis not present

## 2021-11-02 ENCOUNTER — Telehealth: Payer: Self-pay | Admitting: Pulmonary Disease

## 2021-11-02 DIAGNOSIS — G4733 Obstructive sleep apnea (adult) (pediatric): Secondary | ICD-10-CM | POA: Diagnosis not present

## 2021-11-02 NOTE — Telephone Encounter (Signed)
HST 10/31/21 >>  AHI 58.6, SpO2 low 78%  Please inform her that her sleep study shows severe obstructive sleep apnea.  Please arrange for ROV with me or NP to discuss treatment options.

## 2021-11-02 NOTE — Telephone Encounter (Signed)
Spoke with patient  regarding hst results. They verbalized understanding. Denied questions about the results. Scheduled patient for first available in [facility] office with Dr. Halford Chessman. Patient declined a sooner appt in [place].  Nothing further needed at this time.

## 2021-11-30 DIAGNOSIS — E039 Hypothyroidism, unspecified: Secondary | ICD-10-CM | POA: Diagnosis not present

## 2021-12-08 ENCOUNTER — Encounter: Payer: Self-pay | Admitting: Pulmonary Disease

## 2021-12-08 ENCOUNTER — Ambulatory Visit: Payer: Medicare HMO | Admitting: Pulmonary Disease

## 2021-12-08 VITALS — BP 136/84 | HR 78 | Temp 98.2°F | Ht 71.0 in | Wt >= 6400 oz

## 2021-12-08 DIAGNOSIS — G4733 Obstructive sleep apnea (adult) (pediatric): Secondary | ICD-10-CM

## 2021-12-08 DIAGNOSIS — Z7189 Other specified counseling: Secondary | ICD-10-CM

## 2021-12-08 DIAGNOSIS — Z23 Encounter for immunization: Secondary | ICD-10-CM | POA: Diagnosis not present

## 2021-12-08 NOTE — Patient Instructions (Signed)
Will arrange for auto CPAP set up  Follow up in 3 to 4 months 

## 2021-12-08 NOTE — Progress Notes (Signed)
[facility]  Chief Complaint  Patient presents with   Follow-up    Appt to discuss OSA treatment options     Past Surgical History:  She  has a past surgical history that includes Cesarean section; Hernia repair; and Carpal tunnel release (Right).  Past Medical History:  COVID 19 PNA December 2021, OA, HTN, Hypothyroidism, HLD, Persistent atrial fibrillation  Constitutional:  BP 136/84 (BP Location: Right Wrist)   Pulse 78   Temp 98.2 F (36.8 C) (Temporal)   Ht '5\' 11"'$  (1.803 m)   Wt (!) 446 lb 3.2 oz (202.4 kg)   SpO2 98% Comment: ra  BMI 62.23 kg/m   Brief Summary:  Angela [name] is a 66 y.o. female with obstructive sleep apnea.      Subjective:   Home sleep study showed severe sleep apnea.  She tried CPAP previously and was told she could only use a full face mask.  This was not comfortable.  Physical Exam:   Appearance - well kempt   ENMT - no sinus tenderness, no oral exudate, no LAN, Mallampati 3 airway, no stridor  Respiratory - equal breath sounds bilaterally, no wheezing or rales  CV - irregular  Ext - 1+ edema  Skin - no rashes  Psych - normal mood and affect   Sleep Tests:  HST 10/31/21 >>  AHI 58.6, SpO2 low 78%  Cardiac Tests:  Echo 07/20/21 >> EF 55 to 60%, mild LVH  Social History:  She  reports that she has never smoked. She has never used smokeless tobacco. She reports that she does not drink alcohol and does not use drugs.  Family History:  Her family history includes Cancer in her maternal grandfather.     Assessment/Plan:   Obstructive sleep apnea. - sleep study reviewed - discussed how sleep apnea can impact her health - treatment options discussed - will arrange for auto CPAP set up - explained that choice of mask option is at her discretion  Obesity. - discussed how weight can impact sleep and risk for sleep disordered breathing - discussed options to assist with weight  loss: combination of diet modification, cardiovascular and strength training exercises  Time Spent Involved in Patient Care on Day of Examination:  26 minutes  Follow up:   Patient Instructions  Will arrange for auto CPAP set up  Follow up in 3 to 4 months  Medication List:   Allergies as of 12/08/2021   No Known Allergies      Medication List        Accurate as of December 08, 2021 12:02 PM. If you have any questions, ask your nurse or doctor.          STOP taking these medications    fluconazole 100 MG tablet Commonly known as: DIFLUCAN Stopped by: Chesley Mires, MD       TAKE these medications    acetaminophen 500 MG tablet Commonly known as: TYLENOL Take 1,000 mg by mouth every 6 (six) hours as needed for headache.   albuterol 108 (90 Base) MCG/ACT inhaler Commonly known as: VENTOLIN HFA Inhale 2 puffs into the lungs every 6 (six) hours as needed for wheezing or shortness of breath.   apixaban 5 MG Tabs tablet Commonly known as: ELIQUIS Take 1 tablet (5 mg total) by mouth 2 (two) times daily.   celecoxib 200 MG capsule Commonly known as: CELEBREX Take 200 mg by mouth 2 (two) times daily as needed.  cholecalciferol 25 MCG (1000 UNIT) tablet Commonly known as: VITAMIN D3 Take 1,000 Units by mouth daily.   dextromethorphan-guaiFENesin 30-600 MG 12hr tablet Commonly known as: MUCINEX DM Take 1 tablet by mouth 2 (two) times daily.   furosemide 40 MG tablet Commonly known as: LASIX Take 40 mg by mouth daily as needed.   levothyroxine 125 MCG tablet Commonly known as: SYNTHROID Take 125 mcg by mouth daily.   lisinopril-hydrochlorothiazide 10-12.5 MG tablet Commonly known as: ZESTORETIC Take 1 tablet by mouth daily.   mupirocin ointment 2 % Commonly known as: BACTROBAN Apply 1 application topically See admin instructions. Use 1 application topically 2 to 3 times daily   nystatin powder Generic drug: nystatin Apply topically 3 (three) times  daily.   simvastatin 20 MG tablet Commonly known as: ZOCOR Take 20 mg by mouth at bedtime.   vitamin C 100 MG tablet Take 100 mg by mouth daily.   zolpidem 10 MG tablet Commonly known as: AMBIEN Take 10 mg by mouth daily.        Signature:  Chesley Mires, MD [place] Pager - [phone] 12/08/2021, 12:02 PM

## 2021-12-13 ENCOUNTER — Telehealth: Payer: Self-pay | Admitting: Pulmonary Disease

## 2021-12-13 NOTE — Telephone Encounter (Signed)
Ernest Mallick this one will have to go to [facility] its the only one that takes patients insurance

## 2021-12-13 NOTE — Telephone Encounter (Signed)
Angela [name] from [facility] that they are out of network for patients insurance for the cpap machine.  Please advise ladies

## 2021-12-21 DIAGNOSIS — I70203 Unspecified atherosclerosis of native arteries of extremities, bilateral legs: Secondary | ICD-10-CM | POA: Diagnosis not present

## 2021-12-21 DIAGNOSIS — M79676 Pain in unspecified toe(s): Secondary | ICD-10-CM | POA: Diagnosis not present

## 2021-12-21 DIAGNOSIS — L84 Corns and callosities: Secondary | ICD-10-CM | POA: Diagnosis not present

## 2021-12-21 DIAGNOSIS — B351 Tinea unguium: Secondary | ICD-10-CM | POA: Diagnosis not present

## 2022-01-05 NOTE — Telephone Encounter (Signed)
Checked patient's chart. It looks like the order was sent to [facility] on 10/04. Will close encounter.

## 2022-01-12 ENCOUNTER — Encounter: Payer: Self-pay | Admitting: Emergency Medicine

## 2022-01-12 ENCOUNTER — Other Ambulatory Visit: Payer: Self-pay

## 2022-01-12 ENCOUNTER — Ambulatory Visit
Admission: EM | Admit: 2022-01-12 | Discharge: 2022-01-12 | Disposition: A | Discharge status: Home / Self Care | Payer: Medicare HMO | Attending: Family Medicine | Admitting: Family Medicine

## 2022-01-12 DIAGNOSIS — L03113 Cellulitis of right upper limb: Secondary | ICD-10-CM

## 2022-01-12 DIAGNOSIS — M7989 Other specified soft tissue disorders: Secondary | ICD-10-CM

## 2022-01-12 MED ORDER — CEPHALEXIN 500 MG PO CAPS: 500.0000 mg | ORAL_CAPSULE | Freq: Two times a day (BID) | ORAL | 0 refills | Status: DC

## 2022-01-12 NOTE — Discharge Instructions (Signed)
Elevate your arm at rest, keep the skin clean and dry, cover open areas with Neosporin and a bandage.  If able, you may apply a compression wrap to the arm to help with the swelling as well.  Follow-up over the next few days with your primary care provider for a recheck of your symptoms.  Go to the emergency department if your symptoms are significantly worsening at any time.

## 2022-01-12 NOTE — ED Triage Notes (Addendum)
Pt reports right arm pain/swelling x3 weeks. Pt reports intermittent pain and discomfort from elbow down. Pt denies any known injury and reports history of afib and taking blood thinner.   Denies any numbness/tingling. Cap refill wnl, radial pulse strong.

## 2022-01-12 NOTE — ED Provider Notes (Signed)
[facility]    CSN: [id number] Arrival date & time: 01/12/22  1122      History   Chief Complaint Chief Complaint  Patient presents with   Arm Pain    HPI Angela [name] is a 66 y.o. female.   Patient presenting today with forearm pain and swelling for the past 3 weeks or so.  She states the pain is from the elbow down.  Denies numbness, tingling, decreased range of motion, known injury to the area, chest pain, shortness of breath.  So far not trying anything over-the-counter for symptoms.  Past medical history significant for atrial fibrillation on Eliquis daily, venous insufficiency, morbid obesity.    Past Medical History:  Diagnosis Date   Arthritis    COVID-23 February 2020   Essential hypertension    Hypothyroidism    Mixed hyperlipidemia    Persistent atrial fibrillation ([place])    Venous insufficiency     Patient Active Problem List   Diagnosis Date Noted   Pneumonia due to COVID-19 virus 02/10/2020   Thrombocytopenia ([place]) 02/10/2020   Hypoalbuminemia 02/10/2020   Elevated d-dimer 02/10/2020   AKI (acute kidney injury) ([place]) 02/10/2020   Acute respiratory failure with hypoxia ([place]) 02/10/2020   Candidal intertrigo 02/10/2020   Hyperlipidemia 06/06/2016   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   Undiagnosed cardiac murmurs 06/06/2016   Hypothyroid 06/06/2016   Atrial fibrillation ([place]) 06/06/2016   Morbid obesity ([place]) 06/06/2016   Chronic lymphocytic thyroiditis 06/06/2016    Past Surgical History:  Procedure Laterality Date   CARPAL TUNNEL RELEASE Right    CESAREAN SECTION     HERNIA REPAIR      OB History   No obstetric history on file.      Home Medications    Prior to Admission medications   Medication Sig Start Date End Date Taking? Authorizing Provider  cephALEXin (KEFLEX) 500 MG capsule Take 1 capsule (500 mg total) by mouth 2 (two) times daily. 01/12/22  Yes Volney American, PA-C  acetaminophen (TYLENOL) 500 MG  tablet Take 1,000 mg by mouth every 6 (six) hours as needed for headache.    [provider]  albuterol (VENTOLIN HFA) 108 (90 Base) MCG/ACT inhaler Inhale 2 puffs into the lungs every 6 (six) hours as needed for wheezing or shortness of breath. 02/14/20   Barton Dubois, MD  apixaban (ELIQUIS) 5 MG TABS tablet Take 1 tablet (5 mg total) by mouth 2 (two) times daily. 04/16/21   Satira Sark, MD  Ascorbic Acid (VITAMIN C) 100 MG tablet Take 100 mg by mouth daily.    [provider]  celecoxib (CELEBREX) 200 MG capsule Take 200 mg by mouth 2 (two) times daily as needed. 08/04/21   [provider]  cholecalciferol (VITAMIN D3) 25 MCG (1000 UNIT) tablet Take 1,000 Units by mouth daily.    [provider]  dextromethorphan-guaiFENesin (MUCINEX DM) 30-600 MG 12hr tablet Take 1 tablet by mouth 2 (two) times daily. 02/14/20   Barton Dubois, MD  furosemide (LASIX) 40 MG tablet Take 40 mg by mouth daily as needed.    [provider]  levothyroxine (SYNTHROID) 125 MCG tablet Take 125 mcg by mouth daily. 10/29/19   [provider]  lisinopril-hydrochlorothiazide (ZESTORETIC) 10-12.5 MG tablet Take 1 tablet by mouth daily. 04/02/21   [provider]  mupirocin ointment (BACTROBAN) 2 % Apply 1 application topically See admin instructions. Use 1 application topically 2 to 3 times daily 09/10/19  [provider]  NYSTATIN powder Apply topically 3 (three) times daily. 10/01/21   [provider]  simvastatin (ZOCOR) 20 MG tablet Take 20 mg by mouth at bedtime. 09/10/19   [provider]  zolpidem (AMBIEN) 10 MG tablet Take 10 mg by mouth daily. 12/03/21   [provider]    Family History Family History  Problem Relation Age of Onset   Cancer Maternal Grandfather     Social History Social History   Tobacco Use   Smoking status: Never   Smokeless tobacco: Never  Vaping Use   Vaping Use: Never used  Substance  Use Topics   Alcohol use: No   Drug use: No     Allergies   Patient has no known allergies.   Review of Systems Review of Systems PER HPI  Physical Exam Triage Vital Signs ED Triage Vitals [01/12/22 1206]  Enc Vitals Group     BP (!) 173/92     Pulse Rate 85     Resp (!) 22     Temp 97.9 F (36.6 C)     Temp Source Oral     SpO2 93 %     Weight      Height      Head Circumference      Peak Flow      Pain Score 5     Pain Loc      Pain Edu?      Excl. in [place]?    No data found.  Updated Vital Signs BP (!) 173/92 (BP Location: Right Arm)   Pulse 85   Temp 97.9 F (36.6 C) (Oral)   Resp (!) 22   SpO2 93%   Visual Acuity Right Eye Distance:   Left Eye Distance:   Bilateral Distance:    Right Eye Near:   Left Eye Near:    Bilateral Near:     Physical Exam Vitals and nursing note reviewed.  Constitutional:      Appearance: Normal appearance. She is not ill-appearing.  HENT:     Head: Atraumatic.  Eyes:     Extraocular Movements: Extraocular movements intact.     Conjunctiva/sclera: Conjunctivae normal.  Cardiovascular:     Rate and Rhythm: Normal rate and regular rhythm.     Heart sounds: Normal heart sounds.  Pulmonary:     Effort: Pulmonary effort is normal.     Breath sounds: Normal breath sounds.  Musculoskeletal:        General: Swelling and tenderness present. No signs of injury. Normal range of motion.     Cervical back: Normal range of motion and neck supple.     Comments: Diffuse tenderness to palpation right forearm  Skin:    General: Skin is warm and dry.     Findings: Erythema present.     Comments: Area of erythema to the flexural surface of the right forearm with some scabbing, open abrasion to the extensor surface of the right forearm, no significant erythema or drainage from this area.  Diffusely tender to palpation  Neurological:     Mental Status: She is alert and oriented to person, place, and time.     Sensory: No sensory  deficit.     Motor: No weakness.     Gait: Gait normal.     Comments: Right upper extremity neurovascularly intact  Psychiatric:        Mood and Affect: Mood normal.        Thought Content: Thought content  normal.        Judgment: Judgment normal.      UC Treatments / Results  Labs (all labs ordered are listed, but only abnormal results are displayed) Labs Reviewed - No data to display  EKG   Radiology No results found.  Procedures Procedures (including critical care time)  Medications Ordered in UC Medications - No data to display  Initial Impression / Assessment and Plan / UC Course  I have reviewed the triage vital signs and the nursing notes.  Pertinent labs & imaging results that were available during my care of the patient were reviewed by me and considered in my medical decision making (see chart for details).     Hypertensive in triage, otherwise vital signs reassuring today.  Symptoms have been mild and ongoing for 3 weeks now with no change.  There is an area of the flexural surface of the right forearm that is concerning for a cellulitis, unclear if this is the cause of her edema or not at this time.  Pulses strong, cap refill normal and normal sensation.  DVT ultrasound deferred with shared decision making given low suspicion as well as her already being on Eliquis daily, will cover for cellulitis and discussed arm elevation, frequent movement and compression as well as close follow-up with primary care provider.  Final Clinical Impressions(s) / UC Diagnoses   Final diagnoses:  Forearm swelling  Cellulitis of right upper extremity     Discharge Instructions      Elevate your arm at rest, keep the skin clean and dry, cover open areas with Neosporin and a bandage.  If able, you may apply a compression wrap to the arm to help with the swelling as well.  Follow-up over the next few days with your primary care provider for a recheck of your symptoms.  Go to the  emergency department if your symptoms are significantly worsening at any time.    ED Prescriptions     Medication Sig Dispense Auth. Provider   cephALEXin (KEFLEX) 500 MG capsule Take 1 capsule (500 mg total) by mouth 2 (two) times daily. 14 capsule Volney American, [place]      PDMP not reviewed this encounter.   Volney American, [place] 01/12/22 1425

## 2022-01-18 ENCOUNTER — Telehealth: Payer: Self-pay | Admitting: Cardiology

## 2022-01-18 MED ORDER — FUROSEMIDE 40 MG PO TABS: 40.0000 mg | ORAL_TABLET | Freq: Every day | ORAL | 3 refills | Status: DC | PRN

## 2022-01-18 NOTE — Telephone Encounter (Signed)
Patient seen in [facility] 01/12/22 for right forearm cellulitis. She states the redness in the forearm is improved but she still has swelling in her hand. She still has a day of antibiotics to take. She will try to use scale at home to weigh self. (She tells me he scales don't work ) She tells me she ran out of Lasix months ago but thought her Zestoretic had Lasix in it. I refilled her Lasix and told her to take it. She states her leg swelling is no worse than before.  According to UC note, DVT US deferred as she was on Eliquis.I advised her to speak with pcp also per UC discharge instructions. She will call Dr.Golding     I will to [place] for review.

## 2022-01-18 NOTE — Telephone Encounter (Signed)
Pt c/o swelling: STAT is pt has developed SOB within 24 hours  How much weight have you gained and in what time span?  Unsure   If swelling, where is the swelling located?  Right arm--from elbow down to fingertips, legs   Are you currently taking a fluid pill?  Yes   Are you currently SOB?  No   Do you have a log of your daily weights (if so, list)?   Have you gained 3 pounds in a day or 5 pounds in a week?   Have you traveled recently?  No

## 2022-02-18 ENCOUNTER — Other Ambulatory Visit: Payer: Self-pay | Admitting: Cardiology

## 2022-02-21 NOTE — Telephone Encounter (Signed)
Prescription refill request for Eliquis received. Indication: AF Last office visit: 10/07/21  B Strader PA-C Scr: 1.15 on 04/13/21 Age: 66 Weight: 200.2kg  Based on above findings Eliquis '5mg'$  twice daily is the appropriate dose.  Refill approved.

## 2022-03-07 DIAGNOSIS — G4733 Obstructive sleep apnea (adult) (pediatric): Secondary | ICD-10-CM | POA: Diagnosis not present

## 2022-03-10 ENCOUNTER — Other Ambulatory Visit: Payer: Self-pay

## 2022-03-10 MED ORDER — FUROSEMIDE 40 MG PO TABS: 40.0000 mg | ORAL_TABLET | Freq: Every day | ORAL | 3 refills | Status: DC | PRN

## 2022-03-22 DIAGNOSIS — I70203 Unspecified atherosclerosis of native arteries of extremities, bilateral legs: Secondary | ICD-10-CM | POA: Diagnosis not present

## 2022-03-22 DIAGNOSIS — L84 Corns and callosities: Secondary | ICD-10-CM | POA: Diagnosis not present

## 2022-03-22 DIAGNOSIS — M79676 Pain in unspecified toe(s): Secondary | ICD-10-CM | POA: Diagnosis not present

## 2022-03-22 DIAGNOSIS — B351 Tinea unguium: Secondary | ICD-10-CM | POA: Diagnosis not present

## 2022-04-06 NOTE — Progress Notes (Unsigned)
Cardiology Office Note  Date: 04/07/2022   ID: Phaedra, [employer] Aug 03, 1955, MRN [id number]  PCP:  Sharilyn Sites, MD  Cardiologist:  Rozann Lesches, MD Electrophysiologist:  None   Chief Complaint  Patient presents with   Cardiac follow-up    History of Present Illness: Angela [name] is a 67 y.o. female last seen in August 2023 by Ms. Strader PA-C, I reviewed the note.  She is here for a follow-up visit.  Reports only an occasional sense of brief palpitations, no chest pain.  No bleeding problems on Eliquis.  I reviewed her interval lab work done in August and September of last year.  She continues to follow with Dr. Hilma Favors.  Does not report any major interval change in health.  She is also on Zocor with last LDL 65.  Past Medical History:  Diagnosis Date   Arthritis    COVID-23 February 2020   Essential hypertension    Hypothyroidism    Mixed hyperlipidemia    Persistent atrial fibrillation (H[facility])    Venous insufficiency     Current Outpatient Medications  Medication Sig Dispense Refill   acetaminophen (TYLENOL) 500 MG tablet Take 1,000 mg by mouth every 6 (six) hours as needed for headache.     albuterol (VENTOLIN HFA) 108 (90 Base) MCG/ACT inhaler Inhale 2 puffs into the lungs every 6 (six) hours as needed for wheezing or shortness of breath. 8 g 1   apixaban (ELIQUIS) 5 MG TABS tablet TAKE 1 TABLET TWICE DAILY 180 tablet 1   Ascorbic Acid (VITAMIN C) 100 MG tablet Take 100 mg by mouth daily.     celecoxib (CELEBREX) 200 MG capsule Take 200 mg by mouth 2 (two) times daily as needed.     cephALEXin (KEFLEX) 500 MG capsule Take 1 capsule (500 mg total) by mouth 2 (two) times daily. 14 capsule 0   cholecalciferol (VITAMIN D3) 25 MCG (1000 UNIT) tablet Take 1,000 Units by mouth daily.     dextromethorphan-guaiFENesin (MUCINEX DM) 30-600 MG 12hr tablet Take 1 tablet by mouth 2 (two) times daily. 40 tablet 0   furosemide (LASIX) 40 MG tablet Take 1 tablet (40 mg  total) by mouth daily as needed. 30 tablet 3   levothyroxine (SYNTHROID) 125 MCG tablet Take 125 mcg by mouth daily.     lisinopril-hydrochlorothiazide (ZESTORETIC) 10-12.5 MG tablet Take 1 tablet by mouth daily.     mupirocin ointment (BACTROBAN) 2 % Apply 1 application topically See admin instructions. Use 1 application topically 2 to 3 times daily     NYSTATIN powder Apply topically 3 (three) times daily.     simvastatin (ZOCOR) 20 MG tablet Take 20 mg by mouth at bedtime.     zolpidem (AMBIEN) 10 MG tablet Take 10 mg by mouth daily.     No current facility-administered medications for this visit.   Allergies:  Patient has no known allergies.   ROS: No falls or syncope.  Physical Exam: VS:  BP 138/72   Pulse 92   Ht '5\' 11"'$  (1.803 m)   Wt (!) 438 lb 9.6 oz (198.9 kg)   SpO2 93%   BMI 61.17 kg/m , BMI Body mass index is 61.17 kg/m.  Wt Readings from Last 3 Encounters:  04/07/22 (!) 438 lb 9.6 oz (198.9 kg)  12/08/21 (!) 446 lb 3.2 oz (202.4 kg)  10/07/21 (!) 441 lb 6.4 oz (200.2 kg)    General: Patient appears comfortable at rest. HEENT: Conjunctiva and  lids normal. Lungs: Clear to auscultation, nonlabored breathing at rest. Cardiac: Irregularly irregular, no significant systolic murmur.   ECG:  An ECG dated 07/07/2021 was personally reviewed today and demonstrated:  Rate controlled atrial fibrillation with right bundle branch block.  Recent Labwork: 04/13/2021: BUN 20; Creatinine, Ser 1.15; Hemoglobin 13.5; Platelets 247; Potassium 4.2; Sodium 139  August 2023: BUN 26, creatinine 1.41, potassium 5.1, AST 14, ALT 12, cholesterol 126, triglycerides 118, HDL 40, LDL 65, hemoglobin 13.8, platelets 260 September 2023: TSH 1.76  Other Studies Reviewed Today:  Cardiac monitor February 2023: ZIO XT reviewed. 2 days, 7 hours analyzed. Predominant rhythm is atrial fibrillation with 100% rhythm burden during monitoring period. Over 90% of the time the patient's heart rate was  adequately controlled, average heart rate was in the 70s. There were brief episodes of significant RVR. A 3-second pause was noted on February 9 at 9:26 AM, did not result in patient triggered event. There were otherwise rare PVCs and ventricular couplets representing less than 1% total beats.   Echocardiogram 07/20/2021:  1. Left ventricular ejection fraction, by estimation, is 55 to 60%. The  left ventricle has normal function. Left ventricular endocardial border  not optimally defined to evaluate regional wall motion. There is mild left  ventricular hypertrophy. Left  ventricular diastolic parameters are indeterminate.   2. RV is not well visualized. Grossly it appears mild to moderately  enlarged with normal systolic function. . Right ventricular systolic  function was not well visualized. The right ventricular size is not well  visualized. Tricuspid regurgitation signal  is inadequate for assessing PA pressure.   3. Left atrial size was mildly dilated.   4. The mitral valve is normal in structure. No evidence of mitral valve  regurgitation. No evidence of mitral stenosis.   5. The aortic valve is tricuspid. Aortic valve regurgitation is not  visualized. No aortic stenosis is present.   6. The inferior vena cava is dilated in size with >50% respiratory  variability, suggesting right atrial pressure of 8 mmHg.   Assessment and Plan:  1.  Permanent atrial fibrillation with CHA2DS2-VASc score of 3.  She is minimally symptomatic and has done well with reasonable heart rate control in the absence of AV nodal blockers.  She does not report any spontaneous bleeding problems on Eliquis.  I reviewed her lab work.  No changes were made today.  2.  Mixed hyperlipidemia on Zocor.  LDL 65.  Medication Adjustments/Labs and Tests Ordered: Current medicines are reviewed at length with the patient today.  Concerns regarding medicines are outlined above.   Tests Ordered: No orders of the defined  types were placed in this encounter.   Medication Changes: No orders of the defined types were placed in this encounter.   Disposition:  Follow up  6 months.  Signed, Satira Sark, MD, [facility] 04/07/2022 12:03 PM    [place] [facility] [address], [place], [place] [address] Phone: [phone]; Fax: [phone]

## 2022-04-07 ENCOUNTER — Ambulatory Visit: Payer: Medicare PPO | Attending: Cardiology | Admitting: Cardiology

## 2022-04-07 ENCOUNTER — Encounter: Payer: Self-pay | Admitting: Cardiology

## 2022-04-07 VITALS — BP 138/72 | HR 92 | Ht 71.0 in | Wt >= 6400 oz

## 2022-04-07 DIAGNOSIS — E782 Mixed hyperlipidemia: Secondary | ICD-10-CM

## 2022-04-07 DIAGNOSIS — R7309 Other abnormal glucose: Secondary | ICD-10-CM | POA: Diagnosis not present

## 2022-04-07 DIAGNOSIS — I4819 Other persistent atrial fibrillation: Secondary | ICD-10-CM

## 2022-04-07 DIAGNOSIS — G4733 Obstructive sleep apnea (adult) (pediatric): Secondary | ICD-10-CM | POA: Diagnosis not present

## 2022-04-07 NOTE — Patient Instructions (Signed)
Medication Instructions:  Your physician recommends that you continue on your current medications as directed. Please refer to the Current Medication list given to you today.   Labwork: None today  Testing/Procedures: None today  Follow-Up: 6 months  Any Other Special Instructions Will Be Listed Below (If Applicable).  If you need a refill on your cardiac medications before your next appointment, please call your pharmacy.  

## 2022-04-27 ENCOUNTER — Encounter: Payer: Self-pay | Admitting: Pulmonary Disease

## 2022-04-27 ENCOUNTER — Ambulatory Visit (INDEPENDENT_AMBULATORY_CARE_PROVIDER_SITE_OTHER): Payer: Medicare PPO | Admitting: Pulmonary Disease

## 2022-04-27 VITALS — BP 136/88 | HR 88 | Ht 71.0 in | Wt >= 6400 oz

## 2022-04-27 DIAGNOSIS — G4733 Obstructive sleep apnea (adult) (pediatric): Secondary | ICD-10-CM

## 2022-04-27 NOTE — Progress Notes (Signed)
[facility]  Chief Complaint  Patient presents with   Follow-up    CPAP working well but patient has a hard time putting it back on after getting up to use the bathroom at night.     Past Surgical History:  She  has a past surgical history that includes Cesarean section; Hernia repair; and Carpal tunnel release (Right).  Past Medical History:  COVID 19 PNA December 2021, OA, HTN, Hypothyroidism, HLD, Persistent atrial fibrillation  Constitutional:  BP 136/88   Pulse 88   Ht 5' 11"$  (1.803 m)   Wt (!) 439 lb 6.4 oz (199.3 kg)   SpO2 94% Comment: ra  BMI 61.28 kg/m   Brief Summary:  Angela [name] is a 67 y.o. female with obstructive sleep apnea.      Subjective:   She has been using CPAP and feels this has helped.  She uses nasal cushion.  No issue with mask fit.  Not having sinus congestion or dry mouth.  She goes to bed around 10 pm and reads for 30 minutes.  She falls asleep easily with CPAP.  She then sleeps for about 4 hours.  She gets up to use the bathroom, but then feels like she is smothering when she puts her CPAP back on.  This last for about 20 to 30 minutes, and then she is able to fall back to sleep.  After that she has no issue with the pressure setting.  Physical Exam:   Appearance - well kempt   ENMT - no sinus tenderness, no oral exudate, no LAN, Mallampati 3 airway, no stridor  Respiratory - equal breath sounds bilaterally, no wheezing or rales  CV - s1s2 regular rate and rhythm, no murmurs  Ext - no clubbing, no edema  Skin - no rashes  Psych - normal mood and affect    Sleep Tests:  HST 10/31/21 >>  AHI 58.6, SpO2 low 78% Auto CPAP 03/27/22 to 04/25/22 >> used on 30 of 30 nights with average 4 hrs 1 min.  Average AHI 2.1 with median CPAP 12 and 95 th percentile CPAP 19 cm H2O  Cardiac Tests:  Echo 07/20/21 >> EF 55 to 60%, mild LVH  Social History:  She  reports that she has never smoked. She has  never used smokeless tobacco. She reports that she does not drink alcohol and does not use drugs.  Family History:  Her family history includes Cancer in her maternal grandfather.     Assessment/Plan:   Obstructive sleep apnea. - she is compliant with CPAP and reports benefit from therapy - she uses [employer] for her DME - current CPAP ordered October 2023 - continue auto CPAP 5 to 20 cm H2O - discussed how to adjust timing for Ramp setting on her CPAP; if she continues to have difficulty, then might need to adjust her auto CPAP range  Obesity. - discussed how weight can impact sleep and risk for sleep disordered breathing - discussed options to assist with weight loss: combination of diet modification, cardiovascular and strength training exercises  Time Spent Involved in Patient Care on Day of Examination:  27 minutes  Follow up:   Patient Instructions  Try adjusting the Ramp setting on your CPAP machine.  Follow up in 1 year.  Medication List:   Allergies as of 04/27/2022   No Known Allergies      Medication List        Accurate as of April 27, 2022  8:50 AM. If you have any questions, ask your nurse or doctor.          STOP taking these medications    cephALEXin 500 MG capsule Commonly known as: KEFLEX Stopped by: Chesley Mires, MD       TAKE these medications    acetaminophen 500 MG tablet Commonly known as: TYLENOL Take 1,000 mg by mouth every 6 (six) hours as needed for headache.   albuterol 108 (90 Base) MCG/ACT inhaler Commonly known as: VENTOLIN HFA Inhale 2 puffs into the lungs every 6 (six) hours as needed for wheezing or shortness of breath.   celecoxib 200 MG capsule Commonly known as: CELEBREX Take 200 mg by mouth 2 (two) times daily as needed.   cholecalciferol 25 MCG (1000 UNIT) tablet Commonly known as: VITAMIN D3 Take 1,000 Units by mouth daily.   dextromethorphan-guaiFENesin 30-600 MG 12hr tablet Commonly known as: MUCINEX  DM Take 1 tablet by mouth 2 (two) times daily.   Eliquis 5 MG Tabs tablet Generic drug: apixaban TAKE 1 TABLET TWICE DAILY   furosemide 40 MG tablet Commonly known as: LASIX Take 1 tablet (40 mg total) by mouth daily as needed.   levothyroxine 125 MCG tablet Commonly known as: SYNTHROID Take 125 mcg by mouth daily.   lisinopril-hydrochlorothiazide 10-12.5 MG tablet Commonly known as: ZESTORETIC Take 1 tablet by mouth daily.   mupirocin ointment 2 % Commonly known as: BACTROBAN Apply 1 application topically See admin instructions. Use 1 application topically 2 to 3 times daily   nystatin powder Generic drug: nystatin Apply topically 3 (three) times daily.   simvastatin 20 MG tablet Commonly known as: ZOCOR Take 20 mg by mouth at bedtime.   vitamin C 100 MG tablet Take 100 mg by mouth daily.   zolpidem 10 MG tablet Commonly known as: AMBIEN Take 10 mg by mouth daily.        Signature:  Chesley Mires, MD [place] Pager - [phone] 04/27/2022, 8:50 AM

## 2022-04-27 NOTE — Patient Instructions (Signed)
Try adjusting the Ramp setting on your CPAP machine.  Follow up in 1 year.

## 2022-05-06 DIAGNOSIS — G4733 Obstructive sleep apnea (adult) (pediatric): Secondary | ICD-10-CM | POA: Diagnosis not present

## 2022-06-06 DIAGNOSIS — G4733 Obstructive sleep apnea (adult) (pediatric): Secondary | ICD-10-CM | POA: Diagnosis not present

## 2022-06-23 DIAGNOSIS — I70203 Unspecified atherosclerosis of native arteries of extremities, bilateral legs: Secondary | ICD-10-CM | POA: Diagnosis not present

## 2022-06-23 DIAGNOSIS — B351 Tinea unguium: Secondary | ICD-10-CM | POA: Diagnosis not present

## 2022-06-23 DIAGNOSIS — L84 Corns and callosities: Secondary | ICD-10-CM | POA: Diagnosis not present

## 2022-06-23 DIAGNOSIS — M79673 Pain in unspecified foot: Secondary | ICD-10-CM | POA: Diagnosis not present

## 2022-07-06 DIAGNOSIS — G4733 Obstructive sleep apnea (adult) (pediatric): Secondary | ICD-10-CM | POA: Diagnosis not present

## 2022-07-20 ENCOUNTER — Other Ambulatory Visit: Payer: Self-pay | Admitting: Cardiology

## 2022-07-20 NOTE — Telephone Encounter (Signed)
Prescription refill request for Eliquis received. Indication:Afib Last office visit:2/24 Scr:1.4 8/23 Age: 67 Weight:199.3  kg  Prescription refilled

## 2022-07-26 DIAGNOSIS — G4733 Obstructive sleep apnea (adult) (pediatric): Secondary | ICD-10-CM | POA: Diagnosis not present

## 2022-08-06 DIAGNOSIS — G4733 Obstructive sleep apnea (adult) (pediatric): Secondary | ICD-10-CM | POA: Diagnosis not present

## 2022-09-05 DIAGNOSIS — G4733 Obstructive sleep apnea (adult) (pediatric): Secondary | ICD-10-CM | POA: Diagnosis not present

## 2022-09-20 ENCOUNTER — Telehealth: Payer: Self-pay | Admitting: Cardiology

## 2022-09-20 NOTE — Telephone Encounter (Signed)
New Message:     Patient wants to know if Dr Diona Browner would please write her a prescription for an antibiotic. She wanted him to do this, because she wanted to make sure it was an antibiotic she could take along with her other medicine and also would not interfere with her condition. This antibiotic is for an infection in her tooth.

## 2022-09-20 NOTE — Telephone Encounter (Signed)
I encouraged patient to see dentist or pcp regarding tooth. She said she may go to Urgent Care.

## 2022-09-29 DIAGNOSIS — B351 Tinea unguium: Secondary | ICD-10-CM | POA: Diagnosis not present

## 2022-09-29 DIAGNOSIS — L84 Corns and callosities: Secondary | ICD-10-CM | POA: Diagnosis not present

## 2022-09-29 DIAGNOSIS — M79676 Pain in unspecified toe(s): Secondary | ICD-10-CM | POA: Diagnosis not present

## 2022-09-29 DIAGNOSIS — I70203 Unspecified atherosclerosis of native arteries of extremities, bilateral legs: Secondary | ICD-10-CM | POA: Diagnosis not present

## 2022-10-06 DIAGNOSIS — G4733 Obstructive sleep apnea (adult) (pediatric): Secondary | ICD-10-CM | POA: Diagnosis not present

## 2022-10-11 ENCOUNTER — Ambulatory Visit: Payer: Medicare PPO | Attending: Cardiology | Admitting: Cardiology

## 2022-10-11 ENCOUNTER — Telehealth: Payer: Self-pay | Admitting: Cardiology

## 2022-10-11 ENCOUNTER — Encounter: Payer: Self-pay | Admitting: Cardiology

## 2022-10-11 VITALS — BP 139/85 | HR 78 | Ht 71.0 in | Wt >= 6400 oz

## 2022-10-11 DIAGNOSIS — E782 Mixed hyperlipidemia: Secondary | ICD-10-CM | POA: Diagnosis not present

## 2022-10-11 DIAGNOSIS — I10 Essential (primary) hypertension: Secondary | ICD-10-CM

## 2022-10-11 DIAGNOSIS — I4821 Permanent atrial fibrillation: Secondary | ICD-10-CM

## 2022-10-11 NOTE — Telephone Encounter (Signed)
Pre-op clearance addressed in Dr. Ival Bible office visit note from today, 8/6. Office note faxed to requesting office. Will remove  this request from pre-op pool.

## 2022-10-11 NOTE — Telephone Encounter (Signed)
I called to clarify on amount of teeth to be extracted. It rang a few times and then I got a message saying there was no one available at the time. Goodbye. I will try call again later.

## 2022-10-11 NOTE — Telephone Encounter (Signed)
Pre-op team,   Please clarify with requesting office how many teeth are being extracted.   Thank you!  DW

## 2022-10-11 NOTE — Progress Notes (Signed)
Cardiology Office Note  Date: 10/11/2022   ID: Angela [name], DOB [birth year], MRN [id number]  History of Present Illness: Angela [name] is a 67 y.o. female last seen in February.  She is here for a routine visit.  Reports no palpitations in the setting of atrial fibrillation, heart rate controlled in the absence of AV nodal blockers.  She is pending dental extractions with [facility] and request to hold Eliquis.  I received notification of this today.  ECG today shows rate controlled atrial fibrillation with right bundle branch block and nonspecific ST changes.  We went over her medications, she does not report any spontaneous bleeding problems on Eliquis.  Otherwise on Zestoretic and Zocor.  She continues to follow with Dr. Phillips Odor for primary care.  Physical Exam: VS:  BP 139/85   Pulse 78   Ht 5\' 11"  (1.803 m)   Wt (!) 436 lb (197.8 kg)   SpO2 94%   BMI 60.81 kg/m , BMI Body mass index is 60.81 kg/m.  Wt Readings from Last 3 Encounters:  10/11/22 (!) 436 lb (197.8 kg)  04/27/22 (!) 439 lb 6.4 oz (199.3 kg)  04/07/22 (!) 438 lb 9.6 oz (198.9 kg)    General: Patient appears comfortable at rest. HEENT: Conjunctiva and lids normal. Lungs: Clear to auscultation, nonlabored breathing at rest. Cardiac: Irregularly irregular, no significant murmur, no gallop.  ECG:  An ECG dated 07/07/2021 was personally reviewed today and demonstrated:  Atrial fibrillation with right bundle branch block.  Labwork:  August 2023: BUN 26, creatinine 1.41, potassium 5.1, AST 14, ALT 12, cholesterol 126, triglycerides 118, HDL 40, LDL 65, hemoglobin 13.8, platelets 260 September 2023: TSH 1.76 February 2024: Hemoglobin A1c 6.4%  Other Studies Reviewed Today:  Echocardiogram 07/20/2021:  1. Left ventricular ejection fraction, by estimation, is 55 to 60%. The  left ventricle has normal function. Left ventricular endocardial border  not optimally defined to evaluate regional wall motion.  There is mild left  ventricular hypertrophy. Left  ventricular diastolic parameters are indeterminate.   2. RV is not well visualized. Grossly it appears mild to moderately  enlarged with normal systolic function. . Right ventricular systolic  function was not well visualized. The right ventricular size is not well  visualized. Tricuspid regurgitation signal  is inadequate for assessing PA pressure.   3. Left atrial size was mildly dilated.   4. The mitral valve is normal in structure. No evidence of mitral valve  regurgitation. No evidence of mitral stenosis.   5. The aortic valve is tricuspid. Aortic valve regurgitation is not  visualized. No aortic stenosis is present.   6. The inferior vena cava is dilated in size with >50% respiratory  variability, suggesting right atrial pressure of 8 mmHg.   Assessment and Plan:  1.  Permanent atrial fibrillation with CHA2DS2-VASc score of 3.  She is asymptomatic and heart rate is within normal range in the absence of AV nodal blockers.  She is on Eliquis for stroke prophylaxis.  I reviewed her most recent available lab work.  She continues to follow with Dr. Phillips Odor for primary care.  2.  Pending dental extraction with request to hold Eliquis.  Would recommend a 48-hour hold and then resume most likely within 24 hours after extraction as long as hemostasis achieved.  3.  Mixed hyperlipidemia on Zocor.  LDL 65 in August 2023.  4.  Essential hypertension.  No change in current regimen including dose of Zestoretic.  Disposition:  Follow  up  6 months.  Signed, Jonelle Sidle, M.D., F.A.C.C. [facility] at [facility]

## 2022-10-11 NOTE — Telephone Encounter (Signed)
   Pre-operative Risk Assessment    Patient Name: Angela [name]  DOB: 10/10/1955 MRN: [id number]     Request for Surgical Clearance    Procedure:  Dental Extraction - Amount of Teeth to be Pulled:  not indicated  Date of Surgery:  Clearance TBD                                 Surgeon:  Not indicated Surgeon's Group or Practice Name:  [facility] Phone number:  [phone] Fax number:  [phone]   Type of Clearance Requested:   - Medical  - Pharmacy:  Hold Apixaban (Eliquis)     Type of Anesthesia:  Not Indicated   Additional requests/questions:    Lamont Dowdy   10/11/2022, 12:20 PM

## 2022-10-11 NOTE — Patient Instructions (Signed)
Medication Instructions:  Your physician recommends that you continue on your current medications as directed. Please refer to the Current Medication list given to you today.   Labwork: None today  Testing/Procedures: None today  Follow-Up: 6 months  Any Other Special Instructions Will Be Listed Below (If Applicable).  If you need a refill on your cardiac medications before your next appointment, please call your pharmacy.  

## 2022-10-18 ENCOUNTER — Telehealth: Payer: Self-pay | Admitting: Cardiology

## 2022-10-18 NOTE — Telephone Encounter (Signed)
[facility] calling asking for clearance to be refaxed because they did not receive last week. She states pt is there in the office now.

## 2022-10-18 NOTE — Telephone Encounter (Signed)
Message relayed to patient.

## 2022-10-18 NOTE — Telephone Encounter (Signed)
I will re-fax clearance notes from Dr. Diona Browner, see in his notes about holding blood thinner.

## 2022-10-18 NOTE — Telephone Encounter (Signed)
Per Dr McDowell's 8/6 note - "2.  Pending dental extraction with request to hold Eliquis.  Would recommend a 48-hour hold and then resume most likely within 24 hours after extraction as long as hemostasis achieved."

## 2022-10-18 NOTE — Telephone Encounter (Signed)
Pt c/o medication issue:  1. Name of Medication: ELIQUIS 5 MG TABS tablet   2. How are you currently taking this medication (dosage and times per day)?   TAKE 1 TABLET TWICE DAILY    3. Are you having a reaction (difficulty breathing--STAT)? No  4. What is your medication issue? Patient had a dental procedure today and could not remember when she should resume her Eliquis. Please advise.

## 2022-11-06 DIAGNOSIS — G4733 Obstructive sleep apnea (adult) (pediatric): Secondary | ICD-10-CM | POA: Diagnosis not present

## 2022-12-06 DIAGNOSIS — G4733 Obstructive sleep apnea (adult) (pediatric): Secondary | ICD-10-CM | POA: Diagnosis not present

## 2022-12-22 DIAGNOSIS — B351 Tinea unguium: Secondary | ICD-10-CM | POA: Diagnosis not present

## 2022-12-22 DIAGNOSIS — L84 Corns and callosities: Secondary | ICD-10-CM | POA: Diagnosis not present

## 2022-12-22 DIAGNOSIS — I70203 Unspecified atherosclerosis of native arteries of extremities, bilateral legs: Secondary | ICD-10-CM | POA: Diagnosis not present

## 2022-12-22 DIAGNOSIS — M79676 Pain in unspecified toe(s): Secondary | ICD-10-CM | POA: Diagnosis not present

## 2023-01-06 DIAGNOSIS — G4733 Obstructive sleep apnea (adult) (pediatric): Secondary | ICD-10-CM | POA: Diagnosis not present

## 2023-01-26 ENCOUNTER — Other Ambulatory Visit (HOSPITAL_COMMUNITY): Payer: Self-pay | Admitting: Internal Medicine

## 2023-01-26 DIAGNOSIS — Z1231 Encounter for screening mammogram for malignant neoplasm of breast: Secondary | ICD-10-CM

## 2023-01-30 DIAGNOSIS — G4733 Obstructive sleep apnea (adult) (pediatric): Secondary | ICD-10-CM | POA: Diagnosis not present

## 2023-03-20 ENCOUNTER — Other Ambulatory Visit: Payer: Self-pay | Admitting: *Deleted

## 2023-03-20 MED ORDER — APIXABAN 5 MG PO TABS: 5.0000 mg | ORAL_TABLET | Freq: Two times a day (BID) | ORAL | 1 refills | Status: DC

## 2023-03-20 NOTE — Telephone Encounter (Signed)
 Prescription refill request for Eliquis  received. Indication: AF Last office visit: 10/11/22 GORMAN Sierras MD Scr: 1.41 on 10/15/21 Age: 68 Weight: 197.8kg  Based on above findings Eliquis  5mg  twice daily is the appropriate dose.  Refill approved.  Labs are past due.  Requested CBC/BMP at upcoming appt with Dr Sierras on 04/19/23.

## 2023-04-19 ENCOUNTER — Ambulatory Visit: Payer: Medicare Other | Admitting: Cardiology

## 2023-04-24 ENCOUNTER — Telehealth: Payer: Self-pay | Admitting: Cardiology

## 2023-04-24 NOTE — Telephone Encounter (Signed)
Patient's broker is requesting call back to discuss where the process is for the forms dropped off for patients insurance to be continued. Please advise.

## 2023-04-25 NOTE — Telephone Encounter (Signed)
Angela [name] is returning phone call. Please advise.

## 2023-04-26 NOTE — Telephone Encounter (Signed)
Caller [name]) called to follow-up on patient's Chronic Verification Form.  Caller stated they will need to get a response soon or patient's insurance may be canceled.

## 2023-04-26 NOTE — Telephone Encounter (Signed)
Spoke to [name] and assured her that once form was received by office, we would fax to [facility].   Fax #: [phone]  [name] had no further questions or concerns at this time.

## 2023-04-26 NOTE — Telephone Encounter (Addendum)
Form sent to [facility] office for provider to address. Per Diona Browner, Atrial Fibrillation is not included in the chronic conditions listed on form. Will defer form completion to PCP.  Will send form back to the [place] office via courier.

## 2023-04-26 NOTE — Telephone Encounter (Signed)
[name] notified that form is being faxed back to [facility] by M. Hiderbrandt from the [place] office.

## 2023-04-28 ENCOUNTER — Other Ambulatory Visit: Payer: Self-pay

## 2023-05-04 DIAGNOSIS — I70203 Unspecified atherosclerosis of native arteries of extremities, bilateral legs: Secondary | ICD-10-CM | POA: Diagnosis not present

## 2023-05-04 DIAGNOSIS — L84 Corns and callosities: Secondary | ICD-10-CM | POA: Diagnosis not present

## 2023-05-04 DIAGNOSIS — B351 Tinea unguium: Secondary | ICD-10-CM | POA: Diagnosis not present

## 2023-05-04 DIAGNOSIS — M79676 Pain in unspecified toe(s): Secondary | ICD-10-CM | POA: Diagnosis not present

## 2023-05-05 DIAGNOSIS — G4733 Obstructive sleep apnea (adult) (pediatric): Secondary | ICD-10-CM | POA: Diagnosis not present

## 2023-05-18 ENCOUNTER — Other Ambulatory Visit: Payer: Self-pay | Admitting: Cardiology

## 2023-05-19 NOTE — Telephone Encounter (Signed)
 Prescription refill request for Eliquis received. Indication:afib Last office visit:8/24 ZOX:WRUEA labs Age: 68 Weight:197.8  kg  Prescription refilled

## 2023-05-25 DIAGNOSIS — N390 Urinary tract infection, site not specified: Secondary | ICD-10-CM | POA: Diagnosis not present

## 2023-05-25 DIAGNOSIS — D518 Other vitamin B12 deficiency anemias: Secondary | ICD-10-CM | POA: Diagnosis not present

## 2023-05-25 DIAGNOSIS — G4730 Sleep apnea, unspecified: Secondary | ICD-10-CM | POA: Diagnosis not present

## 2023-05-25 DIAGNOSIS — E559 Vitamin D deficiency, unspecified: Secondary | ICD-10-CM | POA: Diagnosis not present

## 2023-05-25 DIAGNOSIS — R7309 Other abnormal glucose: Secondary | ICD-10-CM | POA: Diagnosis not present

## 2023-05-25 DIAGNOSIS — I890 Lymphedema, not elsewhere classified: Secondary | ICD-10-CM | POA: Diagnosis not present

## 2023-05-25 DIAGNOSIS — I87393 Chronic venous hypertension (idiopathic) with other complications of bilateral lower extremity: Secondary | ICD-10-CM | POA: Diagnosis not present

## 2023-05-25 DIAGNOSIS — Z0001 Encounter for general adult medical examination with abnormal findings: Secondary | ICD-10-CM | POA: Diagnosis not present

## 2023-05-25 DIAGNOSIS — I872 Venous insufficiency (chronic) (peripheral): Secondary | ICD-10-CM | POA: Diagnosis not present

## 2023-05-25 DIAGNOSIS — E782 Mixed hyperlipidemia: Secondary | ICD-10-CM | POA: Diagnosis not present

## 2023-05-25 DIAGNOSIS — E039 Hypothyroidism, unspecified: Secondary | ICD-10-CM | POA: Diagnosis not present

## 2023-05-25 DIAGNOSIS — I4891 Unspecified atrial fibrillation: Secondary | ICD-10-CM | POA: Diagnosis not present

## 2023-06-22 ENCOUNTER — Ambulatory Visit: Payer: Medicare Other | Attending: Cardiology | Admitting: Cardiology

## 2023-06-22 ENCOUNTER — Encounter: Payer: Self-pay | Admitting: Cardiology

## 2023-06-22 VITALS — BP 138/80 | HR 99 | Ht 71.0 in | Wt >= 6400 oz

## 2023-06-22 DIAGNOSIS — I4821 Permanent atrial fibrillation: Secondary | ICD-10-CM | POA: Diagnosis not present

## 2023-06-22 DIAGNOSIS — I10 Essential (primary) hypertension: Secondary | ICD-10-CM | POA: Diagnosis not present

## 2023-06-22 DIAGNOSIS — E782 Mixed hyperlipidemia: Secondary | ICD-10-CM

## 2023-06-22 DIAGNOSIS — N390 Urinary tract infection, site not specified: Secondary | ICD-10-CM | POA: Diagnosis not present

## 2023-06-22 NOTE — Progress Notes (Signed)
    Cardiology Office Note  Date: 06/22/2023   ID: SHAKEELA RABADAN, DOB [birth year], MRN [id number]  History of Present Illness: Angela [name] is a 68 y.o. female last seen in August 2024.  She is here for a routine visit.  Reports no palpitations, has had no dizziness or syncope.  She is using a walker today, states that she has been having trouble with right hip pain.  She also has lymphedema present. . We went over her medications.  No change in cardiac regimen.  She does not report any spontaneous bleeding problems on Eliquis.  I reviewed her lab work which is noted below.  LDL down to 50 on Zocor.  Physical Exam: VS:  BP 138/80 (BP Location: Left Arm)   Pulse 99   Ht 5\' 11"  (1.803 m)   Wt (!) 459 lb 12.8 oz (208.6 kg)   SpO2 97%   BMI 64.13 kg/m , BMI Body mass index is 64.13 kg/m.  Wt Readings from Last 3 Encounters:  06/22/23 (!) 459 lb 12.8 oz (208.6 kg)  10/11/22 (!) 436 lb (197.8 kg)  04/27/22 (!) 439 lb 6.4 oz (199.3 kg)    General: Patient appears comfortable at rest. HEENT: Conjunctiva and lids normal. Lungs: Clear to auscultation, nonlabored breathing at rest. Cardiac: Irregularly irregular, 1/6 systolic murmur, no gallop. Extremities: Chronic appearing lymphedema.  ECG:  An ECG dated 10/11/2022 was personally reviewed today and demonstrated:  Atrial fibrillation with right bundle branch block and nonspecific ST changes.  Labwork:  March 2025: Hemoglobin 12.6, platelets 284, BUN 23, creatinine 1.33, GFR 44, potassium 4.6, AST 12, ALT 12, cholesterol 113, triglycerides 115, HDL 42, LDL 50, hemoglobin A1c 5.9%, TSH 3.09  Other Studies Reviewed Today:  No interval cardiac testing for review today.  Assessment and Plan:  1.  Permanent atrial fibrillation with CHA2DS2-VASc score of 3.  She reports no palpitations.  Continue Eliquis 5 mg twice daily for stroke prophylaxis, interval lab work reviewed above.  She is not on any AV nodal blockers at this point for  heart rate control.   2.  Mixed hyperlipidemia.  Recent LDL 50.  Continue Zocor 20 mg daily.   3.  Primary hypertension.  Continue Zestoretic 10/12.5 mg daily.  4.  Lymphedema in the setting of morbid obesity.  Disposition:  Follow up  6 months.  Signed, Gerard Knight, M.D., F.A.C.C. [facility] at [facility]

## 2023-06-22 NOTE — Patient Instructions (Signed)
 Medication Instructions:  Your physician recommends that you continue on your current medications as directed. Please refer to the Current Medication list given to you today.   Labwork: None   Testing/Procedures: None today  Follow-Up: 6 months  Any Other Special Instructions Will Be Listed Below (If Applicable).  If you need a refill on your cardiac medications before your next appointment, please call your pharmacy.

## 2023-07-13 DIAGNOSIS — M79675 Pain in left toe(s): Secondary | ICD-10-CM | POA: Diagnosis not present

## 2023-07-13 DIAGNOSIS — L84 Corns and callosities: Secondary | ICD-10-CM | POA: Diagnosis not present

## 2023-07-13 DIAGNOSIS — M79674 Pain in right toe(s): Secondary | ICD-10-CM | POA: Diagnosis not present

## 2023-07-13 DIAGNOSIS — B351 Tinea unguium: Secondary | ICD-10-CM | POA: Diagnosis not present

## 2023-07-13 DIAGNOSIS — I70203 Unspecified atherosclerosis of native arteries of extremities, bilateral legs: Secondary | ICD-10-CM | POA: Diagnosis not present

## 2023-09-19 ENCOUNTER — Telehealth: Payer: Self-pay | Admitting: Adult Health

## 2023-09-19 DIAGNOSIS — G4733 Obstructive sleep apnea (adult) (pediatric): Secondary | ICD-10-CM

## 2023-09-19 NOTE — Telephone Encounter (Unsigned)
 Copied from CRM [id number]. Topic: Clinical - Order For Equipment >> Sep 19, 2023  2:47 PM Russell PARAS wrote: Reason for CRM:   Pt is contacting clinic to request a new order for CPAP supplies from her provider, Carolynne Allan, to be sent to [employer]. Her humidifier is malfunctioning and [employer] advised they would not send just a replacement for the humidifier, but for a whole new machine.   Advised Sood no longer practices at the clinic. Pt requesting call back to verify if order can be sent, or if she will need to have TOC appt before order will be provided.  CB#  [phone]

## 2023-09-20 NOTE — Telephone Encounter (Signed)
 Provider is no longer at practice. Patient will need to see NP or sleep MD to establish care before supplies can be sent.  Routing to front staff to schedule pt soonest appt

## 2023-09-21 NOTE — Telephone Encounter (Signed)
 Patient now scheduled 8/6 w/ Alghanim at [facility] office.

## 2023-10-10 NOTE — Telephone Encounter (Signed)
 Copied from CRM #[id number]. Topic: Clinical - Order For Equipment >> Oct 10, 2023 12:07 PM Celestine FALCON wrote: Reason for CRM: Pt is requesting a new order of DME supplies through [employer]. Pt stated she needs everything for her cpap machine, and has needed it for months. Pt has a transfer of care appt scheduled for 9/29 at 4pm with Dr. Fahid Alghanim.   Pt's phone number is [phone].  Patient has TOC appointment scheduled on 12/04/23 in [place]

## 2023-10-10 NOTE — Telephone Encounter (Signed)
 Good afternoon, Angela [name] is currently out of the office.  I will send a message to her to see if we can send the order to [employer].  Angela [name], please advise regarding sending new order for supplies for CPAP machine to [employer].  Patient has an appointment on 12/04/23 with Dr.Alghanim.  Thank you.

## 2023-10-11 ENCOUNTER — Encounter: Admitting: Pulmonary Disease

## 2023-10-11 NOTE — Addendum Note (Signed)
 Addended by: ROLANDA POWELL SAILOR on: 10/11/2023 01:58 PM   Modules accepted: Orders

## 2023-10-11 NOTE — Telephone Encounter (Signed)
 Please send order for supplies and make sure she keeps follow up as planned to reestablish- My experience is insurance will not cover until she is seen with compliance check .

## 2023-10-11 NOTE — Telephone Encounter (Signed)
 Called and spoke with patient, advised that we would send the order to [facility] for supplies for her CPAP, however, she would have to keep her appointment with Tammy.  She  verified understanding. Order placed in Epic.  Nothing further needed.

## 2023-10-14 ENCOUNTER — Encounter (HOSPITAL_COMMUNITY): Payer: Self-pay

## 2023-10-14 ENCOUNTER — Other Ambulatory Visit: Payer: Self-pay

## 2023-10-14 ENCOUNTER — Emergency Department (HOSPITAL_COMMUNITY)

## 2023-10-14 ENCOUNTER — Inpatient Hospital Stay (HOSPITAL_COMMUNITY)
Admission: EM | Admit: 2023-10-14 | Discharge: 2023-10-20 | DRG: 871 | Disposition: A | Discharge status: Skilled Nursing Facility | Attending: Family Medicine | Admitting: Family Medicine

## 2023-10-14 DIAGNOSIS — Z66 Do not resuscitate: Secondary | ICD-10-CM | POA: Diagnosis present

## 2023-10-14 DIAGNOSIS — R609 Edema, unspecified: Secondary | ICD-10-CM | POA: Diagnosis not present

## 2023-10-14 DIAGNOSIS — G4739 Other sleep apnea: Secondary | ICD-10-CM | POA: Diagnosis present

## 2023-10-14 DIAGNOSIS — J9601 Acute respiratory failure with hypoxia: Secondary | ICD-10-CM | POA: Diagnosis present

## 2023-10-14 DIAGNOSIS — R531 Weakness: Secondary | ICD-10-CM | POA: Diagnosis not present

## 2023-10-14 DIAGNOSIS — E782 Mixed hyperlipidemia: Secondary | ICD-10-CM | POA: Diagnosis not present

## 2023-10-14 DIAGNOSIS — R262 Difficulty in walking, not elsewhere classified: Secondary | ICD-10-CM | POA: Diagnosis not present

## 2023-10-14 DIAGNOSIS — I517 Cardiomegaly: Secondary | ICD-10-CM | POA: Diagnosis not present

## 2023-10-14 DIAGNOSIS — R197 Diarrhea, unspecified: Secondary | ICD-10-CM | POA: Diagnosis not present

## 2023-10-14 DIAGNOSIS — N182 Chronic kidney disease, stage 2 (mild): Secondary | ICD-10-CM | POA: Diagnosis not present

## 2023-10-14 DIAGNOSIS — I878 Other specified disorders of veins: Secondary | ICD-10-CM | POA: Diagnosis present

## 2023-10-14 DIAGNOSIS — E872 Acidosis, unspecified: Secondary | ICD-10-CM | POA: Diagnosis present

## 2023-10-14 DIAGNOSIS — Z886 Allergy status to analgesic agent status: Secondary | ICD-10-CM | POA: Diagnosis not present

## 2023-10-14 DIAGNOSIS — Z8616 Personal history of COVID-19: Secondary | ICD-10-CM | POA: Diagnosis not present

## 2023-10-14 DIAGNOSIS — R6520 Severe sepsis without septic shock: Secondary | ICD-10-CM | POA: Diagnosis not present

## 2023-10-14 DIAGNOSIS — L03115 Cellulitis of right lower limb: Secondary | ICD-10-CM | POA: Diagnosis present

## 2023-10-14 DIAGNOSIS — E8809 Other disorders of plasma-protein metabolism, not elsewhere classified: Secondary | ICD-10-CM | POA: Diagnosis not present

## 2023-10-14 DIAGNOSIS — Z7901 Long term (current) use of anticoagulants: Secondary | ICD-10-CM | POA: Diagnosis not present

## 2023-10-14 DIAGNOSIS — K5730 Diverticulosis of large intestine without perforation or abscess without bleeding: Secondary | ICD-10-CM | POA: Diagnosis not present

## 2023-10-14 DIAGNOSIS — L03116 Cellulitis of left lower limb: Secondary | ICD-10-CM | POA: Diagnosis present

## 2023-10-14 DIAGNOSIS — E039 Hypothyroidism, unspecified: Secondary | ICD-10-CM | POA: Diagnosis not present

## 2023-10-14 DIAGNOSIS — M6281 Muscle weakness (generalized): Secondary | ICD-10-CM | POA: Diagnosis not present

## 2023-10-14 DIAGNOSIS — A419 Sepsis, unspecified organism: Secondary | ICD-10-CM | POA: Diagnosis not present

## 2023-10-14 DIAGNOSIS — Z79899 Other long term (current) drug therapy: Secondary | ICD-10-CM | POA: Diagnosis not present

## 2023-10-14 DIAGNOSIS — I4891 Unspecified atrial fibrillation: Secondary | ICD-10-CM | POA: Diagnosis present

## 2023-10-14 DIAGNOSIS — I129 Hypertensive chronic kidney disease with stage 1 through stage 4 chronic kidney disease, or unspecified chronic kidney disease: Secondary | ICD-10-CM | POA: Diagnosis not present

## 2023-10-14 DIAGNOSIS — R590 Localized enlarged lymph nodes: Secondary | ICD-10-CM | POA: Diagnosis not present

## 2023-10-14 DIAGNOSIS — M160 Bilateral primary osteoarthritis of hip: Secondary | ICD-10-CM | POA: Diagnosis not present

## 2023-10-14 DIAGNOSIS — R9389 Abnormal findings on diagnostic imaging of other specified body structures: Secondary | ICD-10-CM | POA: Diagnosis not present

## 2023-10-14 DIAGNOSIS — I872 Venous insufficiency (chronic) (peripheral): Secondary | ICD-10-CM | POA: Diagnosis present

## 2023-10-14 DIAGNOSIS — N179 Acute kidney failure, unspecified: Secondary | ICD-10-CM | POA: Diagnosis not present

## 2023-10-14 DIAGNOSIS — G4730 Sleep apnea, unspecified: Secondary | ICD-10-CM | POA: Diagnosis not present

## 2023-10-14 DIAGNOSIS — Z6841 Body Mass Index (BMI) 40.0 and over, adult: Secondary | ICD-10-CM | POA: Diagnosis not present

## 2023-10-14 DIAGNOSIS — M1990 Unspecified osteoarthritis, unspecified site: Secondary | ICD-10-CM | POA: Diagnosis present

## 2023-10-14 DIAGNOSIS — M542 Cervicalgia: Secondary | ICD-10-CM | POA: Diagnosis not present

## 2023-10-14 DIAGNOSIS — I10 Essential (primary) hypertension: Secondary | ICD-10-CM | POA: Diagnosis not present

## 2023-10-14 DIAGNOSIS — I4819 Other persistent atrial fibrillation: Secondary | ICD-10-CM | POA: Diagnosis not present

## 2023-10-14 DIAGNOSIS — E785 Hyperlipidemia, unspecified: Secondary | ICD-10-CM | POA: Diagnosis present

## 2023-10-14 DIAGNOSIS — Z743 Need for continuous supervision: Secondary | ICD-10-CM | POA: Diagnosis not present

## 2023-10-14 DIAGNOSIS — R918 Other nonspecific abnormal finding of lung field: Secondary | ICD-10-CM | POA: Diagnosis not present

## 2023-10-14 DIAGNOSIS — I700 Atherosclerosis of aorta: Secondary | ICD-10-CM | POA: Diagnosis not present

## 2023-10-14 DIAGNOSIS — R000 Tachycardia, unspecified: Secondary | ICD-10-CM | POA: Diagnosis not present

## 2023-10-14 DIAGNOSIS — K5792 Diverticulitis of intestine, part unspecified, without perforation or abscess without bleeding: Secondary | ICD-10-CM | POA: Diagnosis not present

## 2023-10-14 DIAGNOSIS — Z79890 Hormone replacement therapy (postmenopausal): Secondary | ICD-10-CM | POA: Diagnosis not present

## 2023-10-14 DIAGNOSIS — I482 Chronic atrial fibrillation, unspecified: Secondary | ICD-10-CM | POA: Diagnosis not present

## 2023-10-14 DIAGNOSIS — K5790 Diverticulosis of intestine, part unspecified, without perforation or abscess without bleeding: Secondary | ICD-10-CM | POA: Diagnosis not present

## 2023-10-14 DIAGNOSIS — Z791 Long term (current) use of non-steroidal anti-inflammatories (NSAID): Secondary | ICD-10-CM

## 2023-10-14 DIAGNOSIS — G4700 Insomnia, unspecified: Secondary | ICD-10-CM | POA: Diagnosis not present

## 2023-10-14 LAB — CBC WITH DIFFERENTIAL/PLATELET
Band Neutrophils: 1 %
Basophils Absolute: 0.1 K/uL (ref 0.0–0.1)
Basophils Relative: 1 %
Eosinophils Absolute: 0 K/uL (ref 0.0–0.5)
Eosinophils Relative: 0 %
HCT: 35.4 % — ABNORMAL LOW (ref 36.0–46.0)
Hemoglobin: 11.2 g/dL — ABNORMAL LOW (ref 12.0–15.0)
Lymphocytes Relative: 2 %
Lymphs Abs: 0.2 K/uL — ABNORMAL LOW (ref 0.7–4.0)
MCH: 27.7 pg (ref 26.0–34.0)
MCHC: 31.6 g/dL (ref 30.0–36.0)
MCV: 87.6 fL (ref 80.0–100.0)
Monocytes Absolute: 0.4 K/uL (ref 0.1–1.0)
Monocytes Relative: 3 %
Neutro Abs: 11.1 K/uL — ABNORMAL HIGH (ref 1.7–7.7)
Neutrophils Relative %: 93 %
Platelets: 111 K/uL — ABNORMAL LOW (ref 150–400)
RBC: 4.04 MIL/uL (ref 3.87–5.11)
RDW: 16.5 % — ABNORMAL HIGH (ref 11.5–15.5)
Smear Review: NORMAL
WBC: 11.8 K/uL — ABNORMAL HIGH (ref 4.0–10.5)
nRBC: 0 % (ref 0.0–0.2)

## 2023-10-14 LAB — COMPREHENSIVE METABOLIC PANEL WITH GFR
ALT: 16 U/L (ref 0–44)
AST: 24 U/L (ref 15–41)
Albumin: 2.6 g/dL — ABNORMAL LOW (ref 3.5–5.0)
Alkaline Phosphatase: 105 U/L (ref 38–126)
Anion gap: 12 (ref 5–15)
BUN: 49 mg/dL — ABNORMAL HIGH (ref 8–23)
CO2: 18 mmol/L — ABNORMAL LOW (ref 22–32)
Calcium: 8.5 mg/dL — ABNORMAL LOW (ref 8.9–10.3)
Chloride: 107 mmol/L (ref 98–111)
Creatinine, Ser: 2.87 mg/dL — ABNORMAL HIGH (ref 0.44–1.00)
GFR, Estimated: 17 mL/min — ABNORMAL LOW (ref >60)
Glucose, Bld: 91 mg/dL (ref 70–99)
Potassium: 4.2 mmol/L (ref 3.5–5.1)
Sodium: 137 mmol/L (ref 135–145)
Total Bilirubin: 1.2 mg/dL (ref 0.0–1.2)
Total Protein: 5.8 g/dL — ABNORMAL LOW (ref 6.5–8.1)

## 2023-10-14 LAB — URINALYSIS, W/ REFLEX TO CULTURE (INFECTION SUSPECTED)
Bilirubin Urine: NEGATIVE
Glucose, UA: NEGATIVE mg/dL
Hgb urine dipstick: NEGATIVE
Ketones, ur: NEGATIVE mg/dL
Nitrite: NEGATIVE
Protein, ur: NEGATIVE mg/dL
Specific Gravity, Urine: 1.021 (ref 1.005–1.030)
pH: 5 (ref 5.0–8.0)

## 2023-10-14 LAB — BRAIN NATRIURETIC PEPTIDE: B Natriuretic Peptide: 202 pg/mL — ABNORMAL HIGH (ref 0.0–100.0)

## 2023-10-14 LAB — LACTIC ACID, PLASMA
Lactic Acid, Venous: 2.3 mmol/L (ref 0.5–1.9)
Lactic Acid, Venous: 1.4 mmol/L (ref 0.5–1.9)

## 2023-10-14 LAB — PROCALCITONIN: Procalcitonin: 5.8 ng/mL

## 2023-10-14 LAB — PROTIME-INR
INR: 2.7 — ABNORMAL HIGH (ref 0.8–1.2)
Prothrombin Time: 30.2 s — ABNORMAL HIGH (ref 11.4–15.2)

## 2023-10-14 MED ORDER — POLYETHYLENE GLYCOL 3350 17 G PO PACK: 17.0000 g | PACK | Freq: Every day | ORAL | Status: DC | PRN

## 2023-10-14 MED ORDER — SODIUM CHLORIDE 0.9 % IV BOLUS (SEPSIS)
1000.0000 mL | Freq: Once | INTRAVENOUS | Status: AC
  Administered 2023-10-14: 1000 mL via INTRAVENOUS

## 2023-10-14 MED ORDER — ONDANSETRON HCL 4 MG PO TABS: 4.0000 mg | ORAL_TABLET | Freq: Four times a day (QID) | ORAL | Status: DC | PRN

## 2023-10-14 MED ORDER — ZOLPIDEM TARTRATE 5 MG PO TABS
5.0000 mg | ORAL_TABLET | Freq: Every day | ORAL | Status: DC
  Administered 2023-10-14 – 2023-10-19 (×9): 5 mg via ORAL
  Filled 2023-10-14 (×6): qty 1

## 2023-10-14 MED ORDER — APIXABAN 5 MG PO TABS
5.0000 mg | ORAL_TABLET | Freq: Two times a day (BID) | ORAL | Status: DC
  Administered 2023-10-14 – 2023-10-20 (×18): 5 mg via ORAL
  Filled 2023-10-14 (×12): qty 1

## 2023-10-14 MED ORDER — LACTATED RINGERS IV SOLN: INTRAVENOUS | Status: AC

## 2023-10-14 MED ORDER — SODIUM CHLORIDE 0.9 % IV SOLN
2.0000 g | Freq: Two times a day (BID) | INTRAVENOUS | Status: DC
  Administered 2023-10-15 – 2023-10-18 (×12): 2 g via INTRAVENOUS
  Filled 2023-10-14 (×7): qty 12.5

## 2023-10-14 MED ORDER — ONDANSETRON HCL 4 MG/2ML IJ SOLN
4.0000 mg | Freq: Four times a day (QID) | INTRAMUSCULAR | Status: DC | PRN
  Administered 2023-10-19: 4 mg via INTRAVENOUS
  Filled 2023-10-14: qty 2

## 2023-10-14 MED ORDER — MORPHINE SULFATE (PF) 2 MG/ML IV SOLN
2.0000 mg | INTRAVENOUS | Status: DC | PRN
  Administered 2023-10-14 – 2023-10-15 (×2): 2 mg via INTRAVENOUS
  Filled 2023-10-14 (×2): qty 1

## 2023-10-14 MED ORDER — SODIUM CHLORIDE 0.9 % IV SOLN
2.0000 g | Freq: Once | INTRAVENOUS | Status: AC
  Administered 2023-10-14: 2 g via INTRAVENOUS
  Filled 2023-10-14: qty 12.5

## 2023-10-14 MED ORDER — NYSTATIN 100000 UNIT/GM EX POWD
1.0000 | Freq: Three times a day (TID) | CUTANEOUS | Status: DC | PRN
  Filled 2023-10-14: qty 15

## 2023-10-14 MED ORDER — LEVOTHYROXINE SODIUM 75 MCG PO TABS
150.0000 ug | ORAL_TABLET | Freq: Every day | ORAL | Status: DC
  Administered 2023-10-15 – 2023-10-20 (×9): 150 ug via ORAL
  Filled 2023-10-14 (×6): qty 2

## 2023-10-14 MED ORDER — METRONIDAZOLE 500 MG/100ML IV SOLN
500.0000 mg | Freq: Two times a day (BID) | INTRAVENOUS | Status: DC
  Administered 2023-10-14 – 2023-10-17 (×9): 500 mg via INTRAVENOUS
  Filled 2023-10-14 (×6): qty 100

## 2023-10-14 MED ORDER — METRONIDAZOLE 500 MG/100ML IV SOLN
500.0000 mg | Freq: Once | INTRAVENOUS | Status: AC
  Administered 2023-10-14: 500 mg via INTRAVENOUS
  Filled 2023-10-14: qty 100

## 2023-10-14 MED ORDER — SODIUM CHLORIDE 0.9 % IV SOLN: INTRAVENOUS | Status: AC

## 2023-10-14 MED ORDER — SODIUM CHLORIDE 0.9 % IV BOLUS
500.0000 mL | Freq: Once | INTRAVENOUS | Status: AC
  Administered 2023-10-14: 500 mL via INTRAVENOUS

## 2023-10-14 MED ORDER — SIMVASTATIN 20 MG PO TABS
20.0000 mg | ORAL_TABLET | Freq: Every day | ORAL | Status: DC
Start: 2023-10-14 — End: 2023-10-20
  Administered 2023-10-14 – 2023-10-19 (×9): 20 mg via ORAL
  Filled 2023-10-14 (×6): qty 1

## 2023-10-14 NOTE — ED Provider Notes (Addendum)
 [place] [facility] Provider Note   CSN: [id number] Arrival date & time: 10/14/23  1414     Patient presents with: Hypotension   Angela [name] is a 68 y.o. female past medical history significant for A-fib, hypertension, arthritis, and hypothyroidism presents today for hypotension, neck pain, diarrhea, and generalized fatigue since Monday.  Patient reports reduced oral intake mild nausea and chills.  Patient denies fever, vomiting, trauma, chest pain, shortness of breath, headache, diplopia, urinary symptoms, blood in stool, or neck stiffness.   HPI     Prior to Admission medications   Medication Sig Start Date End Date Taking? Authorizing Provider  acetaminophen  (TYLENOL ) 500 MG tablet Take 1,000 mg by mouth every 6 (six) hours as needed for headache.   Yes [provider]  Ascorbic Acid (VITAMIN C) 100 MG tablet Take 100 mg by mouth daily.   Yes [provider]  celecoxib (CELEBREX) 200 MG capsule Take 200 mg by mouth 2 (two) times daily as needed for mild pain (pain score 1-3). 08/04/21  Yes [provider]  cholecalciferol  (VITAMIN D3) 25 MCG (1000 UNIT) tablet Take 1,000 Units by mouth daily.   Yes [provider]  ELIQUIS  5 MG TABS tablet TAKE 1 TABLET BY MOUTH TWICE  DAILY 05/19/23  Yes Debera Jayson MATSU, MD  Flaxseed, Linseed, (FLAX SEED OIL PO) Take 1 capsule by mouth daily.   Yes [provider]  furosemide  (LASIX ) 40 MG tablet Take 1 tablet (40 mg total) by mouth daily as needed. Patient taking differently: Take 40 mg by mouth daily as needed for fluid or edema. 03/10/22  Yes Debera Jayson MATSU, MD  levothyroxine  (SYNTHROID ) 150 MCG tablet Take 150 mcg by mouth daily. 08/03/23  Yes [provider]  lisinopril-hydrochlorothiazide (ZESTORETIC) 10-12.5 MG tablet Take 1 tablet by mouth daily. 04/02/21  Yes [provider]  NYSTATIN  powder Apply 1 Application topically 3 (three) times  daily as needed (moisture associated skin irritation/rash). Under breasts 10/01/21  Yes [provider]  simvastatin  (ZOCOR ) 20 MG tablet Take 20 mg by mouth at bedtime. 09/10/19  Yes [provider]  zolpidem  (AMBIEN ) 10 MG tablet Take 10 mg by mouth at bedtime. 12/03/21  Yes [provider]    Allergies: Ibuprofen    Review of Systems  Constitutional:  Positive for chills and fatigue.  Gastrointestinal:  Positive for diarrhea.  Musculoskeletal:  Positive for neck pain.    Updated Vital Signs BP (!) 91/53   Pulse (!) 114   Temp 98 F (36.7 C) (Oral)   Resp (!) 25   Ht 5' 11 (1.803 m)   Wt (!) 190 kg   SpO2 94%   BMI 58.42 kg/m   Physical Exam Vitals and nursing note reviewed.  Constitutional:      General: She is not in acute distress.    Appearance: She is well-developed. She is obese. She is not toxic-appearing or diaphoretic.  HENT:     Head: Normocephalic and atraumatic.  Eyes:     Conjunctiva/sclera: Conjunctivae normal.  Neck:     Comments: Patient has tenderness to palpation of the bilateral paraspinal muscles and bilateral trapezius muscles in the C-spine region.  No meningismus noted. Cardiovascular:     Rate and Rhythm: Regular rhythm. Tachycardia present.     Pulses: Normal pulses.     Heart sounds: Normal heart sounds. No murmur heard. Pulmonary:     Effort: Pulmonary effort is normal. Tachypnea present. No  respiratory distress.     Breath sounds: Normal breath sounds.  Abdominal:     Palpations: Abdomen is soft.     Tenderness: There is no abdominal tenderness.  Musculoskeletal:        General: No swelling.     Cervical back: Neck supple. No rigidity.     Right lower leg: Edema present.     Left lower leg: Edema present.     Comments: Bilateral lower extremity +3 pitting edema, patient states that her edema is at its baseline.  +1 bilateral dorsalis pedis pulses.  Skin:    General: Skin is warm and dry.     Capillary Refill:  Capillary refill takes less than 2 seconds.     Comments: Patient has mild erythema and induration of panniculus, no warmth noted on exam.  Neurological:     General: No focal deficit present.     Mental Status: She is alert and oriented to person, place, and time.  Psychiatric:        Mood and Affect: Mood normal.     (all labs ordered are listed, but only abnormal results are displayed) Labs Reviewed  LACTIC ACID, PLASMA - Abnormal; Notable for the following components:      Result Value   Lactic Acid, Venous 2.3 (*)    All other components within normal limits  COMPREHENSIVE METABOLIC PANEL WITH GFR - Abnormal; Notable for the following components:   CO2 18 (*)    BUN 49 (*)    Creatinine, Ser 2.87 (*)    Calcium 8.5 (*)    Total Protein 5.8 (*)    Albumin 2.6 (*)    GFR, Estimated 17 (*)    All other components within normal limits  CBC WITH DIFFERENTIAL/PLATELET - Abnormal; Notable for the following components:   WBC 11.8 (*)    Hemoglobin 11.2 (*)    HCT 35.4 (*)    RDW 16.5 (*)    Platelets 111 (*)    Neutro Abs 11.1 (*)    Lymphs Abs 0.2 (*)    All other components within normal limits  PROTIME-INR - Abnormal; Notable for the following components:   Prothrombin Time 30.2 (*)    INR 2.7 (*)    All other components within normal limits  BRAIN NATRIURETIC PEPTIDE - Abnormal; Notable for the following components:   B Natriuretic Peptide 202.0 (*)    All other components within normal limits  CULTURE, BLOOD (ROUTINE X 2)  CULTURE, BLOOD (ROUTINE X 2)  LACTIC ACID, PLASMA  URINALYSIS, W/ REFLEX TO CULTURE (INFECTION SUSPECTED)  PROCALCITONIN    EKG: EKG Interpretation Date/Time:  Saturday October 14 2023 14:33:03 EDT Ventricular Rate:  134 PR Interval:    QRS Duration:  165 QT Interval:  326 QTC Calculation: 487 R Axis:   95  Text Interpretation: Atrial fibrillation Right bundle branch block Confirmed by Towana Sharper [phone]) on 10/14/2023 2:57:47  PM  Radiology: CT CHEST ABDOMEN PELVIS WO CONTRAST Result Date: 10/14/2023 CLINICAL DATA:  Sepsis EXAM: CT CHEST, ABDOMEN AND PELVIS WITHOUT CONTRAST TECHNIQUE: Multidetector CT imaging of the chest, abdomen and pelvis was performed following the standard protocol without IV contrast. RADIATION DOSE REDUCTION: This exam was performed according to the departmental dose-optimization program which includes automated exposure control, adjustment of the mA and/or kV according to patient size and/or use of iterative reconstruction technique. COMPARISON:  None available. FINDINGS: Of note, the lack of intravenous contrast limits evaluation of the solid organ parenchyma and vascularity. CT  CHEST FINDINGS Cardiovascular: Mild cardiomegaly. No pericardial effusion.No aortic aneurysm. Scattered coronary atherosclerosis. Mediastinum/Nodes: No mediastinal mass. No mediastinal, hilar, or axillary lymphadenopathy. Lungs/Pleura: The midline trachea and bronchi are patent. No focal airspace consolidation, pleural effusion, or pneumothorax. Subsegmental atelectasis in the lingula. Posterior bibasilar dependent atelectasis. CT ABDOMEN PELVIS FINDINGS Hepatobiliary: No mass.Punctate radiopaque gallstones. No wall thickening. No intrahepatic or extrahepatic biliary ductal dilation. Pancreas: Diffuse fatty atrophy of the pancreatic parenchyma. No mass or ductal dilation. No peripancreatic inflammation or fluid collection. Spleen: Normal size. No mass. Adrenals/Urinary Tract: No adrenal masses. No renal mass. 6 x 10.2 cm intermediate density lesion along the lower pole of the right kidney (axial 80). No hydronephrosis or nephrolithiasis. The urinary bladder is completely decompressed. Stomach/Bowel:Small hiatal hernia. The stomach is decompressed without focal abnormality. No small bowel wall thickening or inflammation. No small bowel obstruction.Normal appendix. Scattered colonic diverticulosis. Vascular/Lymphatic: No aortic aneurysm.  Scattered aortoiliac atherosclerosis. Mildly enlarged left external iliac chain lymph node measuring 2.4 cm. There is a right external iliac chain lymph node measuring 2.2 cm (axial 110). Multiple enlarged bilateral inguinal lymph nodes measuring up to 1.9 cm on the right and 1.1 cm on the left. Reproductive: Age-related atrophy of the uterus and ovaries. No concerning adnexal mass. No free pelvic fluid. Other: No pneumoperitoneum, ascites, or mesenteric inflammation. Musculoskeletal: No acute fracture or destructive lesion. Moderate subcutaneous edema along the lower abdominal pannus. Skin thickening on the right lower pannus (axial 133). Moderate left and severe right hip osteoarthritis. Multilevel degenerative disc disease of the spine. Thoracic DISH. IMPRESSION: 1. No acute intrathoracic abnormality. Specifically, no pneumonia, pulmonary edema, or pleural effusion. 2. Moderate subcutaneous edema along the lower abdominal pannus with overlying skin thickening on the right (axial 133). This could reflect changes of cellulitis and correlation with physical exam findings requested. 3. Mildly enlarged bilateral inguinal and external iliac chain lymph nodes measuring up to 2.4 cm on the right, likely reactive. 4. Along the lower pole of the right kidney, there is a 6 x 10.2 cm intermediate density lesion, possibly an exophytic proteinaceous or hemorrhagic cyst. Alternative differential considerations include a chronic hematoma or cystic retroperitoneal lymphangioma. Comparison with outside imaging is recommended to document stability. If none is available, a nonemergent multiphase abdominal MRI with IV contrast could be considered. 5. Scattered colonic diverticulosis. No changes of acute diverticulitis. Small hiatal hernia. 6. Punctate cholecystolithiasis. Aortic Atherosclerosis (ICD10-I70.0). Electronically Signed   By: Rogelia Myers M.D.   On: 10/14/2023 16:36   DG Chest Port 1 View Result Date:  10/14/2023 CLINICAL DATA:  Possible sepsis EXAM: PORTABLE CHEST 1 VIEW COMPARISON:  02/10/2020 FINDINGS: Mildly degraded exam due to AP portable technique and patient body habitus. Numerous leads and wires project over the chest. Patient rotated right. Chin overlies the apices. Moderate cardiomegaly. No congestive failure. Mild left hemidiaphragm elevation. Probable atelectasis or scar at the right lung base. IMPRESSION: 1. Cardiomegaly without congestive failure. 2. Probable right base scarring or atelectasis. Electronically Signed   By: Rockey Kilts M.D.   On: 10/14/2023 15:29     .Critical Care  Performed by: Francis Ileana SAILOR, PA-C Authorized by: Francis Ileana SAILOR, PA-C   Critical care provider statement:    Critical care time (minutes):  30   Critical care time was exclusive of:  Separately billable procedures and treating other patients   Critical care was necessary to treat or prevent imminent or life-threatening deterioration of the following conditions:  Sepsis   Critical care was time spent  personally by me on the following activities:  Development of treatment plan with patient or surrogate, discussions with consultants, evaluation of patient's response to treatment, examination of patient, ordering and review of laboratory studies, ordering and review of radiographic studies, ordering and performing treatments and interventions, pulse oximetry, re-evaluation of patient's condition and review of old charts   Care discussed with: admitting provider      Medications Ordered in the ED  lactated ringers  infusion ( Intravenous New Bag/Given 10/14/23 1610)  metroNIDAZOLE  (FLAGYL ) IVPB 500 mg (500 mg Intravenous New Bag/Given 10/14/23 1613)  sodium chloride  0.9 % bolus 500 mL (0 mLs Intravenous Stopped 10/14/23 1554)  ceFEPIme  (MAXIPIME ) 2 g in sodium chloride  0.9 % 100 mL IVPB (2 g Intravenous New Bag/Given 10/14/23 1610)  sodium chloride  0.9 % bolus 1,000 mL (1,000 mLs Intravenous New Bag/Given 10/14/23  1626)    And  sodium chloride  0.9 % bolus 1,000 mL (1,000 mLs Intravenous New Bag/Given 10/14/23 1631)                                    Medical Decision Making Amount and/or Complexity of Data Reviewed Labs: ordered. Radiology: ordered.  Risk Prescription drug management. Decision regarding hospitalization.   This patient presents to the ED for concern of hypotension and diarrhea, this involves an extensive number of treatment options, and is a complaint that carries with it a high risk of complications and morbidity.  The differential diagnosis includes sepsis, electrolyte abnormality, orthostatic hypotension, colitis, AKI, pneumonia, UTI   Co morbidities / Chronic conditions that complicate the patient evaluation  A-fib, hypertension, arthritis, and hypothyroidism   Additional history obtained:  Additional history obtained from EMR External records from outside source obtained and reviewed including cardiology notes   Lab Tests:  I Ordered, and personally interpreted labs.  The pertinent results include: Mild leukocytosis at 11.8 with left shift, anemia 11.2, reduced CO2 at 18, elevated bun at 49, elevated creatinine at 2.87, decreased calcium at 8.5, reduced albumin at 2.6, reduced total protein at 5.8, reduced GFR at 17, elevated pro time and INR at 30.2 and 2.7, elevated BNP at 202   Imaging Studies ordered:  I ordered imaging studies including chest x-ray I independently visualized and interpreted imaging which showed cardiomegaly without congestive failure, probable right base scarring or atelectasis. I agree with the radiologist interpretation CT chest abdomen pelvis Noncon which showed no acute intrathoracic abnormality   Cardiac Monitoring: / EKG:  The patient was maintained on a cardiac monitor.  I personally viewed and interpreted the cardiac monitored which showed an underlying rhythm of: A-fib, RBBB   Problem List / ED Course / Critical interventions /  Medication management I ordered medication including IVF, cefepime  and metronidazole  I have reviewed the patients home medicines and have made adjustments as needed Patient placed on 2 L nasal cannula after being found to have SpO2 in the high 80s on room air.   Consultations Obtained:  I requested consultation with the hospitalist, Dr. Barbra and discussed lab and imaging findings as well as pertinent plan - they recommend: Admission   Test / Admission - Considered:  Admit     Final diagnoses:  Sepsis with acute organ dysfunction, due to unspecified organism, unspecified organ dysfunction type, unspecified whether septic shock present (HCC)  AKI (acute kidney injury) [facility])    ED Discharge Orders     None  Francis Ileana SAILOR, PA-C 10/14/23 1703    Suzette Pac, MD 10/14/23 1709    Francis Ileana SAILOR, PA-C 11/12/23 [id number]    Suzette Pac, MD 11/12/23 [phone]

## 2023-10-14 NOTE — Sepsis Progress Note (Signed)
 Sepsis protocol is being followed by [facility]. Fluids are being given based on IBW.

## 2023-10-14 NOTE — H&P (Signed)
 History and Physical    Patient: Angela [name] FMW:[id number] DOB: Aug 04, 1955 DOA: 10/14/2023 DOS: the patient was seen and examined on 10/14/2023 PCP: Marvine Rush, MD  Patient coming from: Home  Chief Complaint:  Chief Complaint  Patient presents with   Hypotension   HPI: Angela [name] is a 68 y.o. female with medical history significant of morbid obesity, chronic venous stasis, atrial fibrillation on anticoagulation, hypertension, hypothyroidism.  Patient seen for chills, hypotension, fatigue since Monday.  She has had decreased oral intake along with 2 episodes of diarrhea over the last 4 hours.  Her symptoms are worsening.  Here, she was noted to have soft blood pressures.  She was briefly hypoxic and was placed on nasal cannula for couple of hours, then her oxygen  levels returned back to normal.  Seen she has noted some redness in the left lower leg that is mildly tender. Review of Systems: As mentioned in the history of present illness. All other systems reviewed and are negative. Past Medical History:  Diagnosis Date   Arthritis    COVID-23 February 2020   Essential hypertension    Hypothyroidism    Mixed hyperlipidemia    Persistent atrial fibrillation (HCC)    Venous insufficiency    Past Surgical History:  Procedure Laterality Date   CARPAL TUNNEL RELEASE Right    CESAREAN SECTION     HERNIA REPAIR     Social History:  reports that she has never smoked. She has never used smokeless tobacco. She reports that she does not drink alcohol and does not use drugs.  Allergies  Allergen Reactions   Ibuprofen Other (See Comments)    Physician states it will affect her kidneys    Family History  Problem Relation Age of Onset   Cancer Maternal Grandfather     Prior to Admission medications   Medication Sig Start Date End Date Taking? Authorizing Provider  acetaminophen  (TYLENOL ) 500 MG tablet Take 1,000 mg by mouth every 6 (six) hours as needed for headache.   Yes  [provider]  Ascorbic Acid (VITAMIN C) 100 MG tablet Take 100 mg by mouth daily.   Yes [provider]  celecoxib (CELEBREX) 200 MG capsule Take 200 mg by mouth 2 (two) times daily as needed for mild pain (pain score 1-3). 08/04/21  Yes [provider]  cholecalciferol  (VITAMIN D3) 25 MCG (1000 UNIT) tablet Take 1,000 Units by mouth daily.   Yes [provider]  ELIQUIS  5 MG TABS tablet TAKE 1 TABLET BY MOUTH TWICE  DAILY 05/19/23  Yes Debera Jayson MATSU, MD  Flaxseed, Linseed, (FLAX SEED OIL PO) Take 1 capsule by mouth daily.   Yes [provider]  furosemide  (LASIX ) 40 MG tablet Take 1 tablet (40 mg total) by mouth daily as needed. Patient taking differently: Take 40 mg by mouth daily as needed for fluid or edema. 03/10/22  Yes Debera Jayson MATSU, MD  levothyroxine  (SYNTHROID ) 150 MCG tablet Take 150 mcg by mouth daily. 08/03/23  Yes [provider]  lisinopril-hydrochlorothiazide (ZESTORETIC) 10-12.5 MG tablet Take 1 tablet by mouth daily. 04/02/21  Yes [provider]  NYSTATIN  powder Apply 1 Application topically 3 (three) times daily as needed (moisture associated skin irritation/rash). Under breasts 10/01/21  Yes [provider]  simvastatin  (ZOCOR ) 20 MG tablet Take 20 mg by mouth at bedtime. 09/10/19  Yes [provider]  zolpidem  (AMBIEN ) 10 MG tablet Take 10 mg by mouth at bedtime. 12/03/21  Yes [provider]    Physical Exam: Vitals:   10/14/23 1515 10/14/23 1518 10/14/23 1545 10/14/23 1737  BP: (!) 96/50  (!) 91/53 99/65  Pulse: (!) 104 (!) 104 (!) 114   Resp: (!) 22 (!) 28 (!) 25 (!) 30  Temp:      TempSrc:      SpO2: (!) 87% 93% 94%   Weight:      Height:       General: Elderly female. Awake and alert and oriented x3. No acute cardiopulmonary distress.  HEENT: Normocephalic atraumatic.  Right and left ears normal in appearance.  Pupils equal, round, reactive to light. Extraocular muscles  are intact. Sclerae anicteric and noninjected.  Moist mucosal membranes. No mucosal lesions.  Neck: Neck supple without lymphadenopathy. No carotid bruits. No masses palpated.  Cardiovascular: Regular rate with normal S1-S2 sounds. No murmurs, rubs, gallops auscultated. No JVD.  Respiratory: Good respiratory effort with no wheezes, rales, rhonchi. Lungs clear to auscultation bilaterally.  No accessory muscle use. Abdomen: Soft, nontender, nondistended. Active bowel sounds. No masses or hepatosplenomegaly  Skin: Redness to the left lower leg.  There is also significant skin breakdown under the pannicular fold.  Please see pictures in chart.  Dry, warm to touch. 2+ dorsalis pedis and radial pulses. Musculoskeletal: No calf or leg pain. All major joints not erythematous nontender.  No upper or lower joint deformation.  Good ROM.  No contractures  Psychiatric: Intact judgment and insight. Pleasant and cooperative. Neurologic: No focal neurological deficits. Strength is 5/5 and symmetric in upper and lower extremities.  Cranial nerves II through XII are grossly intact.  Data Reviewed: Imaging reviewed by me  Assessment and Plan: No notes have been filed under this hospital service. Service: Hospitalist  Principal Problem:   Sepsis (HCC) Active Problems:   Hyperlipidemia   Organic sleep apnea   Hypothyroid   Atrial fibrillation (HCC)   Morbid obesity (HCC)  Sepsis -possibly secondary to cellulitis Continue cefepime  and metronidazole  Blood cultures pending UA in process Second lactic acid pending Blood pressures are little soft but not shock level.  These are improving with IV fluids IV fluid bolus in process Recheck CBC in the morning Atrial fibrillation Ventricular rate improving with IV fluids Continue anticoagulation AKI Continue IV fluids Recheck CMP in the morning Chronic venous stasis Will need diuresis following Hypothyroidism Continue Synthroid     Advance Care  Planning:   Code Status: Limited: Do not attempt resuscitation (DNR) -DNR-LIMITED -Do Not Intubate/DNI confirmed by patient  Consults: None  Family Communication: None  Severity of Illness: The appropriate patient status for this patient is INPATIENT. Inpatient status is judged to be reasonable and necessary in order to provide the required intensity of service to ensure the patient's safety. The patient's presenting symptoms, physical exam findings, and initial radiographic and laboratory data in the context of their chronic comorbidities is felt to place them at high risk for further clinical deterioration. Furthermore, it is not anticipated that the patient will be medically stable for discharge from the hospital within 2 midnights of admission.   * I certify that at the point of admission it is my clinical judgment that the patient will require inpatient hospital care spanning beyond 2 midnights from the point of admission due to high intensity of service, high risk for further deterioration and high frequency of surveillance required.*  Author: Rosalene Wardrop J Shemaiah Round, DO 10/14/2023 5:57 PM  For on call review [email or web address].

## 2023-10-14 NOTE — ED Notes (Signed)
 Please call son with any update on admission or discharge

## 2023-10-14 NOTE — ED Notes (Signed)
 Placed patient on 2L [place] due to sats sitting at 88-91% RA

## 2023-10-14 NOTE — ED Triage Notes (Signed)
 BIB [facility] for hypotension, neck pain, diarrhea, weakness since Monday. Diarrhea X 1 .SABRA Hx of A-fib, A&O. Takes Lisinopril, Eloquis, and lasix .

## 2023-10-15 DIAGNOSIS — A419 Sepsis, unspecified organism: Secondary | ICD-10-CM | POA: Diagnosis not present

## 2023-10-15 DIAGNOSIS — N179 Acute kidney failure, unspecified: Secondary | ICD-10-CM | POA: Diagnosis not present

## 2023-10-15 DIAGNOSIS — R6520 Severe sepsis without septic shock: Secondary | ICD-10-CM | POA: Diagnosis not present

## 2023-10-15 LAB — URINE CULTURE: Culture: <10000 — AB

## 2023-10-15 LAB — BASIC METABOLIC PANEL WITH GFR
Anion gap: 11 (ref 5–15)
BUN: 51 mg/dL — ABNORMAL HIGH (ref 8–23)
CO2: 18 mmol/L — ABNORMAL LOW (ref 22–32)
Calcium: 8 mg/dL — ABNORMAL LOW (ref 8.9–10.3)
Chloride: 109 mmol/L (ref 98–111)
Creatinine, Ser: 2.84 mg/dL — ABNORMAL HIGH (ref 0.44–1.00)
GFR, Estimated: 18 mL/min — ABNORMAL LOW (ref >60)
Glucose, Bld: 101 mg/dL — ABNORMAL HIGH (ref 70–99)
Potassium: 3.8 mmol/L (ref 3.5–5.1)
Sodium: 138 mmol/L (ref 135–145)

## 2023-10-15 LAB — MRSA NEXT GEN BY PCR, NASAL: MRSA by PCR Next Gen: NOT DETECTED

## 2023-10-15 LAB — CBC
HCT: 32 % — ABNORMAL LOW (ref 36.0–46.0)
Hemoglobin: 10.2 g/dL — ABNORMAL LOW (ref 12.0–15.0)
MCH: 27.5 pg (ref 26.0–34.0)
MCHC: 31.9 g/dL (ref 30.0–36.0)
MCV: 86.3 fL (ref 80.0–100.0)
Platelets: 116 K/uL — ABNORMAL LOW (ref 150–400)
RBC: 3.71 MIL/uL — ABNORMAL LOW (ref 3.87–5.11)
RDW: 17 % — ABNORMAL HIGH (ref 11.5–15.5)
WBC: 10.4 K/uL (ref 4.0–10.5)
nRBC: 0 % (ref 0.0–0.2)

## 2023-10-15 LAB — HIV ANTIBODY (ROUTINE TESTING W REFLEX): HIV Screen 4th Generation wRfx: NONREACTIVE

## 2023-10-15 MED ORDER — CHLORHEXIDINE GLUCONATE CLOTH 2 % EX PADS
6.0000 | MEDICATED_PAD | Freq: Every day | CUTANEOUS | Status: DC
  Administered 2023-10-15 – 2023-10-20 (×6): 6 via TOPICAL

## 2023-10-15 MED ORDER — HYDROMORPHONE HCL 1 MG/ML IJ SOLN: 1.0000 mg | INTRAMUSCULAR | Status: DC | PRN

## 2023-10-15 MED ORDER — OXYCODONE HCL 5 MG PO TABS
5.0000 mg | ORAL_TABLET | Freq: Four times a day (QID) | ORAL | Status: DC | PRN
  Administered 2023-10-15 – 2023-10-18 (×18): 5 mg via ORAL
  Filled 2023-10-15 (×10): qty 1

## 2023-10-15 MED ORDER — LACTATED RINGERS IV SOLN: INTRAVENOUS | Status: AC

## 2023-10-15 NOTE — Progress Notes (Addendum)
 PROGRESS NOTE    Patient: Angela [name]                            PCP: Marvine Rush, MD                    DOB: [birth year]            DOA: 10/14/2023 FMW:[id number]             DOS: 10/15/2023, 7:28 AM   LOS: 1 day   Date of Service: The patient was seen and examined on 10/15/2023  Subjective:   The patient was seen and examined this morning. Awake alert oriented, following commands Mildly hypertensive, BP 96/56, satting 92% on room air Afebrile temp 98.3, pulse 92 201 Still complaining of bilateral lower extremity weakness, with erythema edema  Brief Narrative:   AALINA BREGE is a 68 y.o. female with medical history significant of morbid obesity, chronic venous stasis, atrial fibrillation on anticoagulation, hypertension, hypothyroidism.  Patient seen for chills, hypotension, fatigue since Monday.  She has had decreased oral intake along with 2 episodes of diarrhea over the last 4 hours.  Her symptoms are worsening.  Here, she was noted to have soft blood pressures.  She was briefly hypoxic and was placed on nasal cannula for couple of hours, then her oxygen  levels returned back to normal.  Seen she has noted some redness in the left lower leg that is mildly tender.   Principal Problem:   Sepsis (HCC) Active Problems:   Atrial fibrillation (HCC)   Acute respiratory failure with hypoxia (HCC)   Hyperlipidemia   Hypothyroid   Morbid obesity (HCC)   Organic sleep apnea  Sepsis -possibly secondary to cellulitis /pannus - Still meeting sepsis criteria, although afebrile, mildly tachycardic, tachypneic, blood pressure soft, WBC 10.4  Current vitals Blood pressure (!) 100/53, pulse 100, temp. 98.3 F (36.8 C), RR (!) 25,  SpO2 96%on RA - Follow-up with the blood cultures -.  UA reviewed moderate leukocytes few bacteria WBC 21-50 - Currently on broad-spectrum antibiotics cefepime /metronidazole -will continue - Will follow-up with blood/urine cultures Lactic acidosis -   improved =lactic acidosis 2.3>> 1.4 - Continue IVF  Atrial fibrillation - History of A-fib, heart rate 71-114, currently 100 -Not on any rate control medications -Continue Eliquis   Acute renal insufficiency ?  Acute on chronic CKD - Continue IVF, avoiding hypotension, avoiding nephrotoxins  Morbid obesity with chronic venous stasis -Once improved sepsis physiology, will likely need diuretics and diuresing  Hypothyroidism -Continue home dose Synthroid   Morbid obesity  Counseled regarding weight management, follow-up with the PCP regarding weight loss programs Increasing activity exercise,   Nutritional status:  The patient's BMI is: Body mass index is 57.25 kg/m. I agree with the assessment and plan as outlined. caloric intake evaluation recommendations   Skin Assessment: I have examined the patient's skin and I agree with the wound assessment as performed by wound care team Extensive bilateral lower extremity erythema edema Right flank area extensive erythema No open wounds     ---------------------------------------------------------------------------------------------------------------------------------------------------- Cultures; Blood Cultures x 2 >> Urine Culture  >>>  ------------------------------------------------------------------------------------------------------------------  DVT prophylaxis:   apixaban  (ELIQUIS ) tablet 5 mg   Code Status:   Code Status: Limited: Do not attempt resuscitation (DNR) -DNR-LIMITED -Do Not Intubate/DNI   Family Communication: No family member present at bedside-  -Advance care planning has been discussed.   Admission status:   Status is: Inpatient  Remains inpatient appropriate because: Meeting sepsis criteria, needing IVF, IV antibiotics   Disposition: From  - home             Planning for discharge in 1-2 days   Procedures:   No admission procedures for hospital encounter.   Antimicrobials:  Anti-infectives  (From admission, onward)    Start     Dose/Rate Route Frequency Ordered Stop   10/15/23 0400  ceFEPIme  (MAXIPIME ) 2 g in sodium chloride  0.9 % 100 mL IVPB        2 g 200 mL/hr over 30 Minutes Intravenous Every 12 hours 10/14/23 1744     10/14/23 1845  metroNIDAZOLE  (FLAGYL ) IVPB 500 mg        500 mg 100 mL/hr over 60 Minutes Intravenous Every 12 hours 10/14/23 1745     10/14/23 1545  ceFEPIme  (MAXIPIME ) 2 g in sodium chloride  0.9 % 100 mL IVPB        2 g 200 mL/hr over 30 Minutes Intravenous  Once 10/14/23 1543 10/14/23 1740   10/14/23 1545  metroNIDAZOLE  (FLAGYL ) IVPB 500 mg        500 mg 100 mL/hr over 60 Minutes Intravenous  Once 10/14/23 1543 10/14/23 1740        Medication:   apixaban   5 mg Oral BID   Chlorhexidine  Gluconate Cloth  6 each Topical Daily   levothyroxine   150 mcg Oral Daily   simvastatin   20 mg Oral QHS   zolpidem   5 mg Oral QHS    morphine  injection, nystatin , ondansetron  **OR** ondansetron  (ZOFRAN ) IV, polyethylene glycol   Objective:   Vitals:   10/15/23 0400 10/15/23 0500 10/15/23 0600 10/15/23 0715  BP: (!) 94/49 (!) 95/52 (!) 100/53   Pulse: 93 86 100   Resp: 17 (!) 29 (!) 25   Temp:    98.3 F (36.8 C)  TempSrc:    Oral  SpO2: 94% 97% 96%   Weight:      Height:        Intake/Output Summary (Last 24 hours) at 10/15/2023 9271 Last data filed at 10/15/2023 0600 Gross per 24 hour  Intake 5816.24 ml  Output --  Net 5816.24 ml   Filed Weights   10/14/23 1427 10/14/23 1759  Weight: (!) 190 kg (!) 186.2 kg     Physical examination:   General:  AAO x 3,  cooperative, no distress;   HEENT:  Normocephalic, PERRL, otherwise with in Normal limits   Neuro:  CNII-XII intact. , normal motor and sensation, reflexes intact   Lungs:   Clear to auscultation BL, Respirations unlabored,  No wheezes / crackles  Cardio:    S1/S2, RRR, No murmure, No Rubs or Gallops   Abdomen:  Soft, non-tender, bowel sounds active all four quadrants, no guarding or  peritoneal signs.  Muscular  skeletal:  Limited exam -global generalized weaknesses - in bed, able to move all 4 extremities,   2+ pulses,  symmetric, No pitting edema  Skin:  Dry, warm to touch, negative for any Rashes,  Wounds: Please see nursing documentation     Media Information - right flank  Document Information  Media Information left leg  Document Information  Media Information right leg  Document Information  ------------------------------------------------------------------------------------------------------------------------------------------    LABs:     Latest Ref Rng & Units 10/15/2023    3:17 AM 10/14/2023    2:35 PM 04/13/2021    3:56 PM  CBC  WBC 4.0 - 10.5 K/uL 10.4  11.8  8.7   Hemoglobin 12.0 - 15.0 g/dL 89.7  88.7  86.4   Hematocrit 36.0 - 46.0 % 32.0  35.4  44.4   Platelets 150 - 400 K/uL 116  111  247       Latest Ref Rng & Units 10/15/2023    3:17 AM 10/14/2023    2:35 PM 04/13/2021    3:56 PM  CMP  Glucose 70 - 99 mg/dL 898  91  891   BUN 8 - 23 mg/dL 51  49  20   Creatinine 0.44 - 1.00 mg/dL 7.15  7.12  8.84   Sodium 135 - 145 mmol/L 138  137  139   Potassium 3.5 - 5.1 mmol/L 3.8  4.2  4.2   Chloride 98 - 111 mmol/L 109  107  99   CO2 22 - 32 mmol/L 18  18  30    Calcium 8.9 - 10.3 mg/dL 8.0  8.5  9.3   Total Protein 6.5 - 8.1 g/dL  5.8    Total Bilirubin 0.0 - 1.2 mg/dL  1.2    Alkaline Phos 38 - 126 U/L  105    AST 15 - 41 U/L  24    ALT 0 - 44 U/L  16         Micro Results Recent Results (from the past 240 hours)  Blood Culture (routine x 2)     Status: None (Preliminary result)   Collection Time: 10/14/23  3:41 PM   Specimen: BLOOD LEFT HAND  Result Value Ref Range Status   Specimen Description BLOOD LEFT HAND AEROBIC BOTTLE ONLY  Final   Special Requests   Final    Blood Culture results may not be optimal due to an inadequate volume of blood received in culture bottles Performed at [facility], [address] [place],  [place] [address]    Culture PENDING  Incomplete   Report Status PENDING  Incomplete    Radiology Reports CT CHEST ABDOMEN PELVIS WO CONTRAST Result Date: 10/14/2023 CLINICAL DATA:  Sepsis EXAM: CT CHEST, ABDOMEN AND PELVIS WITHOUT CONTRAST TECHNIQUE: Multidetector CT imaging of the chest, abdomen and pelvis was performed following the standard protocol without IV contrast. RADIATION DOSE REDUCTION: This exam was performed according to the departmental dose-optimization program which includes automated exposure control, adjustment of the mA and/or kV according to patient size and/or use of iterative reconstruction technique. COMPARISON:  None available. FINDINGS: Of note, the lack of intravenous contrast limits evaluation of the solid organ parenchyma and vascularity. CT CHEST FINDINGS Cardiovascular: Mild cardiomegaly. No pericardial effusion.No aortic aneurysm. Scattered coronary atherosclerosis. Mediastinum/Nodes: No mediastinal mass. No mediastinal, hilar, or axillary lymphadenopathy. Lungs/Pleura: The midline trachea and bronchi are patent. No focal airspace consolidation, pleural effusion, or pneumothorax. Subsegmental atelectasis in the lingula. Posterior bibasilar dependent atelectasis. CT ABDOMEN PELVIS FINDINGS Hepatobiliary: No mass.Punctate radiopaque gallstones. No wall thickening. No intrahepatic or extrahepatic biliary ductal dilation. Pancreas: Diffuse fatty atrophy of the pancreatic parenchyma. No mass or ductal dilation. No peripancreatic inflammation or fluid collection. Spleen: Normal size. No mass. Adrenals/Urinary Tract: No adrenal masses. No renal mass. 6 x 10.2 cm intermediate density lesion along the lower pole of the right kidney (axial 80). No hydronephrosis or nephrolithiasis. The urinary bladder is completely decompressed. Stomach/Bowel:Small hiatal hernia. The stomach is decompressed without focal abnormality. No small bowel wall thickening or inflammation. No small bowel  obstruction.Normal appendix. Scattered colonic diverticulosis. Vascular/Lymphatic: No aortic aneurysm. Scattered aortoiliac atherosclerosis. Mildly enlarged left external iliac chain lymph node  measuring 2.4 cm. There is a right external iliac chain lymph node measuring 2.2 cm (axial 110). Multiple enlarged bilateral inguinal lymph nodes measuring up to 1.9 cm on the right and 1.1 cm on the left. Reproductive: Age-related atrophy of the uterus and ovaries. No concerning adnexal mass. No free pelvic fluid. Other: No pneumoperitoneum, ascites, or mesenteric inflammation. Musculoskeletal: No acute fracture or destructive lesion. Moderate subcutaneous edema along the lower abdominal pannus. Skin thickening on the right lower pannus (axial 133). Moderate left and severe right hip osteoarthritis. Multilevel degenerative disc disease of the spine. Thoracic DISH. IMPRESSION: 1. No acute intrathoracic abnormality. Specifically, no pneumonia, pulmonary edema, or pleural effusion. 2. Moderate subcutaneous edema along the lower abdominal pannus with overlying skin thickening on the right (axial 133). This could reflect changes of cellulitis and correlation with physical exam findings requested. 3. Mildly enlarged bilateral inguinal and external iliac chain lymph nodes measuring up to 2.4 cm on the right, likely reactive. 4. Along the lower pole of the right kidney, there is a 6 x 10.2 cm intermediate density lesion, possibly an exophytic proteinaceous or hemorrhagic cyst. Alternative differential considerations include a chronic hematoma or cystic retroperitoneal lymphangioma. Comparison with outside imaging is recommended to document stability. If none is available, a nonemergent multiphase abdominal MRI with IV contrast could be considered. 5. Scattered colonic diverticulosis. No changes of acute diverticulitis. Small hiatal hernia. 6. Punctate cholecystolithiasis. Aortic Atherosclerosis (ICD10-I70.0). Electronically Signed    By: Rogelia Myers M.D.   On: 10/14/2023 16:36   DG Chest Port 1 View Result Date: 10/14/2023 CLINICAL DATA:  Possible sepsis EXAM: PORTABLE CHEST 1 VIEW COMPARISON:  02/10/2020 FINDINGS: Mildly degraded exam due to AP portable technique and patient body habitus. Numerous leads and wires project over the chest. Patient rotated right. Chin overlies the apices. Moderate cardiomegaly. No congestive failure. Mild left hemidiaphragm elevation. Probable atelectasis or scar at the right lung base. IMPRESSION: 1. Cardiomegaly without congestive failure. 2. Probable right base scarring or atelectasis. Electronically Signed   By: Rockey Kilts M.D.   On: 10/14/2023 15:29    SIGNED: Adriana DELENA Grams, MD, FHM. FAAFP. Jolynn Pack - [facility]  Critical care time spent - 55 min.  In seeing, evaluating and examining the patient. Reviewing medical records, labs, drawn plan of care. [facility],  Pager (please use [email or web address] to page/ text) Please use Epic Secure Chat for non-urgent communication (7AM-7PM)  If 7PM-7AM, please contact night-coverage [email or web address], 10/15/2023, 7:28 AM

## 2023-10-15 NOTE — Plan of Care (Signed)
   Problem: Education: Goal: Knowledge of General Education information will improve Description: Including pain rating scale, medication(s)/side effects and non-pharmacologic comfort measures Outcome: Progressing   Problem: Nutrition: Goal: Adequate nutrition will be maintained Outcome: Progressing

## 2023-10-15 NOTE — Progress Notes (Signed)
   10/15/23 1733  TOC Brief Assessment  Insurance and Status Reviewed  Patient has primary care physician Yes  Home environment has been reviewed From home c/grandson who is pcg  Prior level of function: Minimal assist  Prior/Current Home Services No current home services  Social Drivers of Health Review SDOH reviewed no interventions necessary  Readmission risk has been reviewed Yes  Transition of care needs no transition of care needs at this time   Transition of Care Department [facility]) has reviewed patient and we will continue to monitor patient advancement through interdisciplinary progression rounds. I

## 2023-10-15 NOTE — Hospital Course (Addendum)
 Angela [name] is a 68 y.o. female with medical history significant of morbid obesity, chronic venous stasis, atrial fibrillation on anticoagulation, hypertension, hypothyroidism.  Patient seen for chills, hypotension, fatigue since Monday.  She has had decreased oral intake along with 2 episodes of diarrhea over the last 4 hours.  Her symptoms are worsening.  Here, she was noted to have soft blood pressures.  She was briefly hypoxic and was placed on nasal cannula for couple of hours, then her oxygen  levels returned back to normal.  Seen she has noted some redness in the left lower leg that is mildly tender.

## 2023-10-16 LAB — CBC
HCT: 33.7 % — ABNORMAL LOW (ref 36.0–46.0)
Hemoglobin: 10.7 g/dL — ABNORMAL LOW (ref 12.0–15.0)
MCH: 27.6 pg (ref 26.0–34.0)
MCHC: 31.8 g/dL (ref 30.0–36.0)
MCV: 87.1 fL (ref 80.0–100.0)
Platelets: 123 K/uL — ABNORMAL LOW (ref 150–400)
RBC: 3.87 MIL/uL (ref 3.87–5.11)
RDW: 17.3 % — ABNORMAL HIGH (ref 11.5–15.5)
WBC: 12.9 K/uL — ABNORMAL HIGH (ref 4.0–10.5)
nRBC: 0 % (ref 0.0–0.2)

## 2023-10-16 LAB — BASIC METABOLIC PANEL WITH GFR
Anion gap: 7 (ref 5–15)
BUN: 47 mg/dL — ABNORMAL HIGH (ref 8–23)
CO2: 21 mmol/L — ABNORMAL LOW (ref 22–32)
Calcium: 8.2 mg/dL — ABNORMAL LOW (ref 8.9–10.3)
Chloride: 111 mmol/L (ref 98–111)
Creatinine, Ser: 2.42 mg/dL — ABNORMAL HIGH (ref 0.44–1.00)
GFR, Estimated: 21 mL/min — ABNORMAL LOW (ref >60)
Glucose, Bld: 118 mg/dL — ABNORMAL HIGH (ref 70–99)
Potassium: 4.1 mmol/L (ref 3.5–5.1)
Sodium: 139 mmol/L (ref 135–145)

## 2023-10-16 NOTE — Plan of Care (Signed)
  Problem: Acute Rehab OT Goals (only OT should resolve) Goal: Pt. Will Perform Grooming Flowsheets (Taken 10/16/2023 1416) Pt Will Perform Grooming:  with modified independence  standing Goal: Pt. Will Perform Lower Body Bathing Flowsheets (Taken 10/16/2023 1416) Pt Will Perform Lower Body Bathing:  with modified independence  sitting/lateral leans  with adaptive equipment Goal: Pt. Will Perform Lower Body Dressing Flowsheets (Taken 10/16/2023 1416) Pt Will Perform Lower Body Dressing:  with modified independence  with adaptive equipment  sitting/lateral leans Goal: Pt. Will Transfer To Toilet Flowsheets (Taken 10/16/2023 1416) Pt Will Transfer to Toilet:  with contact guard assist  stand pivot transfer Goal: Pt. Will Perform Toileting-Clothing Manipulation Flowsheets (Taken 10/16/2023 1416) Pt Will Perform Toileting - Clothing Manipulation and hygiene:  with modified independence  sitting/lateral leans  with adaptive equipment Goal: Pt/Caregiver Will Perform Home Exercise Program Flowsheets (Taken 10/16/2023 1416) Pt/caregiver will Perform Home Exercise Program:  Increased strength  Both right and left upper extremity  Independently  Marcedes Tech OT, MOT

## 2023-10-16 NOTE — TOC Initial Note (Signed)
 Transition of Care [facility]) - Initial/Assessment Note    Patient Details  Name: Angela [name] MRN: [id number] Date of Birth: [birth year]  Transition of [facility]) CM/SW Contact:    Lucie Lunger, LCSWA Phone Number: 10/16/2023, 1:38 PM  Clinical Narrative:                 CSW updated that PT is recommending SNF for pt. CSW met with pt at bedside to complete assessment. Pt states her grandson lives with her and assists her with ADLs and provides her transportation. Pt states she had HH many years ago. Pt uses a walker in the home. CSW spoke with pt about PT recommendation for SNF, pt is agreeable to SNF referral being completed and sent out for review. CSW to complete referral and send out for review. TOC to follow.   Expected Discharge Plan: Skilled Nursing Facility Barriers to Discharge: Continued Medical Work up   Patient Goals and CMS Choice Patient states their goals for this hospitalization and ongoing recovery are:: get stronger CMS Medicare.gov Compare Post Acute Care list provided to:: Patient Choice offered to / list presented to : Patient [place] ownership interest in [facility].provided to:: Patient    Expected Discharge Plan and Services In-house Referral: Clinical Social Work Discharge Planning Services: CM Consult Post Acute Care Choice: Skilled Nursing Facility Living arrangements for the past 2 months: Single Family Home                                      Prior Living Arrangements/Services Living arrangements for the past 2 months: Single Family Home Lives with:: Relatives Patient language and need for interpreter reviewed:: Yes Do you feel safe going back to the place where you live?: Yes      Need for Family Participation in Patient Care: Yes (Comment) Care giver support system in place?: Yes (comment) Current home services: DME (walker) Criminal Activity/Legal Involvement Pertinent to Current Situation/Hospitalization: No - Comment  as needed  Activities of Daily Living   ADL Screening (condition at time of admission) Independently performs ADLs?: Yes (appropriate for developmental age) Is the patient deaf or have difficulty hearing?: No Does the patient have difficulty seeing, even when wearing glasses/contacts?: No Does the patient have difficulty concentrating, remembering, or making decisions?: No  Permission Sought/Granted                  Emotional Assessment Appearance:: Appears stated age Attitude/Demeanor/Rapport: Engaged Affect (typically observed): Accepting Orientation: : Oriented to Self, Oriented to Place, Oriented to  Time, Oriented to Situation Alcohol / Substance Use: Not Applicable Psych Involvement: No (comment)  Admission diagnosis:  AKI (acute kidney injury) (HCC) [N17.9] Sepsis (HCC) [A41.9] Sepsis with acute organ dysfunction, due to unspecified organism, unspecified organ dysfunction type, unspecified whether septic shock present (HCC) [A41.9, R65.20] Patient Active Problem List   Diagnosis Date Noted   Sepsis (HCC) 10/14/2023   Pneumonia due to COVID-19 virus 02/10/2020   Thrombocytopenia (HCC) 02/10/2020   Hypoalbuminemia 02/10/2020   AKI (acute kidney injury) (HCC) 02/10/2020   Acute respiratory failure with hypoxia (HCC) 02/10/2020   Candidal intertrigo 02/10/2020   Hyperlipidemia 06/06/2016   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   Hypothyroid 06/06/2016   Atrial fibrillation (HCC) 06/06/2016   Morbid obesity (HCC) 06/06/2016   Chronic lymphocytic thyroiditis 06/06/2016   PCP:  Marvine Rush, MD Pharmacy:   [facility] [phone] -  [place], [place] - [address] AT [facility] OF [address] & [address] [place] [address] Phone: [phone] Fax: [phone]  [facility] - [place], [place] - [address] [place] [address] Phone: [phone] Fax: [phone]  [facility] Delivery - [place], [place]  - [address] [place] [address] Phone: [phone] Fax: [phone]     Social Drivers of Health (SDOH) Social History: SDOH Screenings   Food Insecurity: No Food Insecurity (10/14/2023)  Housing: Low Risk  (10/14/2023)  Transportation Needs: No Transportation Needs (10/14/2023)  Utilities: Not At Risk (10/14/2023)  Social Connections: Socially Isolated (10/14/2023)  Tobacco Use: Low Risk  (10/14/2023)   SDOH Interventions:     Readmission Risk Interventions    10/15/2023    5:32 PM  Readmission Risk Prevention Plan  Transportation Screening Complete  PCP or Specialist Appt within 5-7 Days Complete  Home Care Screening Complete  Medication Review (RN CM) Complete

## 2023-10-16 NOTE — Evaluation (Signed)
 Occupational Therapy Evaluation Patient Details Name: CAROLYNE WHITSEL MRN: [id number] DOB: 1955/05/04 Today's Date: 10/16/2023   History of Present Illness   ADYLENE DLUGOSZ is a 68 y.o. female with medical history significant of morbid obesity, chronic venous stasis, atrial fibrillation on anticoagulation, hypertension, hypothyroidism.  Patient seen for chills, hypotension, fatigue since Monday.  She has had decreased oral intake along with 2 episodes of diarrhea over the last 4 hours.  Her symptoms are worsening.  Here, she was noted to have soft blood pressures.  She was briefly hypoxic and was placed on nasal cannula for couple of hours, then her oxygen  levels returned back to normal.  Seen she has noted some redness in the left lower leg that is mildly tender. (per MD)     Clinical Impressions Pt agreeable to OT and PT co-evaluation. PT went back later in the morning and assisted the pt back to the chair. During the co-evaluation the pt required mod A for bed mobility and EOB to chair transfer. Pt reports difficulty with ADL's recently and today could not complete lower body ADL's well based on clinical judgement and observation. Pt would benefit from education on adaptive strategies for dressing. B UE generally weak as well. Pt left in the chair with call bell within reach. Pt will benefit from continued OT in the hospital and recommended venue below to increase strength, balance, and endurance for safe ADL's.        If plan is discharge home, recommend the following:   A lot of help with walking and/or transfers;A lot of help with bathing/dressing/bathroom;Assistance with cooking/housework;Assist for transportation;Help with stairs or ramp for entrance     Functional Status Assessment   Patient has had a recent decline in their functional status and demonstrates the ability to make significant improvements in function in a reasonable and predictable amount of time.      Equipment Recommendations   None recommended by OT             Precautions/Restrictions   Precautions Precautions: Fall Recall of Precautions/Restrictions: Intact Restrictions Weight Bearing Restrictions Per Provider Order: No     Mobility Bed Mobility Overal bed mobility: Needs Assistance Bed Mobility: Supine to Sit     Supine to sit: Mod assist     General bed mobility comments: use of rails and assist to move R LE to EOB; labored movement.    Transfers Overall transfer level: Needs assistance Equipment used: Rolling walker (2 wheels) Transfers: Sit to/from Stand, Bed to chair/wheelchair/BSC Sit to Stand: Mod assist     Step pivot transfers: Mod assist     General transfer comment: EOB to chair with RW; labored movement; extended time; unsteady      Balance Overall balance assessment: Needs assistance Sitting-balance support: Feet supported, No upper extremity supported   Sitting balance - Comments: fair seated EOB   Standing balance support: Bilateral upper extremity supported, During functional activity, Reliant on assistive device for balance Standing balance-Leahy Scale: Poor Standing balance comment: using RW                           ADL either performed or assessed with clinical judgement   ADL Overall ADL's : Needs assistance/impaired     Grooming: Set up;Sitting   Upper Body Bathing: Set up;Minimal assistance;Sitting   Lower Body Bathing: Maximal assistance;Total assistance;Sitting/lateral leans   Upper Body Dressing : Set up;Sitting   Lower Body Dressing: Total assistance;Maximal  assistance;Sitting/lateral leans   Toilet Transfer: Moderate assistance;Rolling walker (2 wheels);Stand-pivot Statistician Details (indicate cue type and reason): Simulated via EOB to chair with RW. Toileting- Clothing Manipulation and Hygiene: Maximal assistance;Sitting/lateral lean;Moderate assistance               Vision  Baseline Vision/History: 1 Wears glasses Ability to See in Adequate Light: 0 Adequate Patient Visual Report: No change from baseline Vision Assessment?: Wears glasses for reading     Perception Perception: Not tested       Praxis Praxis: Not tested       Pertinent Vitals/Pain Pain Assessment Pain Assessment: 0-10 Pain Score: 5  Pain Location: neck and BIL LEs Pain Descriptors / Indicators: Aching Pain Intervention(s): Limited activity within patient's tolerance, Monitored during session, Repositioned     Extremity/Trunk Assessment Upper Extremity Assessment Upper Extremity Assessment: Generalized weakness   Lower Extremity Assessment Lower Extremity Assessment: Defer to PT evaluation   Cervical / Trunk Assessment Cervical / Trunk Assessment: Normal   Communication Communication Communication: No apparent difficulties   Cognition Arousal: Alert Behavior During Therapy: WFL for tasks assessed/performed Cognition: No apparent impairments                               Following commands: Intact       Cueing  General Comments   Cueing Techniques: Verbal cues;Tactile cues                 Home Living Family/patient expects to be discharged to:: Private residence Living Arrangements: Other relatives Available Help at Discharge: Family;Available PRN/intermittently Type of Home: House Home Access: Level entry Entrance Stairs-Number of Steps: 1 Entrance Stairs-Rails: None Home Layout: Two level Alternate Level Stairs-Number of Steps: Able to live on main level   Bathroom Shower/Tub: Chief Strategy Officer: Handicapped height Bathroom Accessibility: Yes How Accessible: Accessible via wheelchair;Accessible via walker Home Equipment: Rolling Walker (2 wheels);Cane - quad;Grab bars - tub/shower   Additional Comments: Reports grandson lives with her      Prior Functioning/Environment Prior Level of Function : Needs assist        Physical Assist : Mobility (physical) Mobility (physical): Gait;Transfers ADLs (physical): IADLs Mobility Comments: short distance ambulation using RW with supervision outdoors ADLs Comments: Independent ADLs, but reportedly difficult. Assisted IADL's.    OT Problem List: Decreased strength;Decreased activity tolerance;Impaired balance (sitting and/or standing);Obesity;Pain   OT Treatment/Interventions: Self-care/ADL training;Therapeutic exercise;DME and/or AE instruction;Therapeutic activities;Patient/family education;Balance training      OT Goals(Current goals can be found in the care plan section)   Acute Rehab OT Goals Patient Stated Goal: improve function OT Goal Formulation: With patient Time For Goal Achievement: 10/30/23 Potential to Achieve Goals: Good   OT Frequency:  Min 3X/week    Co-evaluation PT/OT/SLP Co-Evaluation/Treatment: Yes Reason for Co-Treatment: To address functional/ADL transfers PT goals addressed during session: Mobility/safety with mobility;Balance;Proper use of DME OT goals addressed during session: ADL's and self-care                       End of Session Equipment Utilized During Treatment: Rolling walker (2 wheels)  Activity Tolerance: Patient tolerated treatment well Patient left: in chair;with call bell/phone within reach  OT Visit Diagnosis: Unsteadiness on feet (R26.81);Other abnormalities of gait and mobility (R26.89);Muscle weakness (generalized) (M62.81)                Time: 9159-9142 OT Time Calculation (min):  17 min Charges:  OT General Charges $OT Visit: 1 Visit OT Evaluation $OT Eval Low Complexity: 1 Low  Aslynn Brunetti OT, MOT  Jayson Person 10/16/2023, 2:14 PM

## 2023-10-16 NOTE — Evaluation (Signed)
 Physical Therapy Evaluation Patient Details Name: Angela [name] MRN: [id number] DOB: November 21, 1955 Today's Date: 10/16/2023  History of Present Illness  Angela [name] is a 68 y.o. female with medical history significant of morbid obesity, chronic venous stasis, atrial fibrillation on anticoagulation, hypertension, hypothyroidism.  Patient seen for chills, hypotension, fatigue since Monday.  She has had decreased oral intake along with 2 episodes of diarrhea over the last 4 hours.  Her symptoms are worsening.  Here, she was noted to have soft blood pressures.  She was briefly hypoxic and was placed on nasal cannula for couple of hours, then her oxygen  levels returned back to normal.  Seen she has noted some redness in the left lower leg that is mildly tender.   Clinical Impression  Patient demonstrates slow labored movement for sitting up at bedside requiring much assist for the advancement of BIL LEs to sit EOB. She presents with BIL LE weakness, poor standing balance, and decreased activity tolerance that significantly impact her functional mobility and safety. During STS transfer, patient desats but is able to recover with rest. Patient limited to side stepping at bedside using RW due to c/o of pain in BIL LEs and fatigue. She presents with very unsteady standing balance and heavy reliance on RW for stability. Patient will benefit from continued skilled physical therapy in hospital and recommended venue below to increase strength, balance, endurance for safe ADLs and gait.         If plan is discharge home, recommend the following: A lot of help with bathing/dressing/bathroom;A lot of help with walking and/or transfers;Help with stairs or ramp for entrance;Assist for transportation   Can travel by private vehicle   No    Equipment Recommendations None recommended by PT  Recommendations for Other Services       Functional Status Assessment Patient has had a recent decline in their  functional status and demonstrates the ability to make significant improvements in function in a reasonable and predictable amount of time.     Precautions / Restrictions Precautions Precautions: Fall Recall of Precautions/Restrictions: Intact Restrictions Weight Bearing Restrictions Per Provider Order: No      Mobility  Bed Mobility Overal bed mobility: Needs Assistance Bed Mobility: Supine to Sit     Supine to sit: Mod assist     General bed mobility comments: Patient able to pull to EOB using handrails, much assist advancing LEs from supine    Transfers Overall transfer level: Needs assistance Equipment used: Rolling walker (2 wheels) Transfers: Sit to/from Stand, Bed to chair/wheelchair/BSC Sit to Stand: Mod assist, Max assist   Step pivot transfers: Mod assist       General transfer comment: Significantly limited by LE weakness, able to stand with much assist from EOB, very unsteady, brief desat but able to recover    Ambulation/Gait Ambulation/Gait assistance: Mod assist, Max assist Gait Distance (Feet): 3 Feet Assistive device: Rolling walker (2 wheels) Gait Pattern/deviations: Decreased step length - right, Decreased step length - left Gait velocity: decreased     General Gait Details: Limited to labored side steps at bedside due to c/o LE pain, BIL weakness, and poor activity tolerance  Stairs            Wheelchair Mobility     Tilt Bed    Modified Rankin (Stroke Patients Only)       Balance Overall balance assessment: Needs assistance Sitting-balance support: Feet supported, No upper extremity supported   Sitting balance - Comments: fair seated EOB  Standing balance-Leahy Scale: Poor Standing balance comment: using RW                             Pertinent Vitals/Pain Pain Assessment Pain Assessment: 0-10 Pain Score: 5  Pain Location: neck and BIL LEs Pain Descriptors / Indicators: Discomfort Pain Intervention(s):  Monitored during session    Home Living Family/patient expects to be discharged to:: Private residence Living Arrangements: Other relatives Available Help at Discharge: Family;Available 24 hours/day Type of Home: House Home Access: Stairs to enter Entrance Stairs-Rails: None Entrance Stairs-Number of Steps: 1 Alternate Level Stairs-Number of Steps: Able to live on main level Home Layout: Two level Home Equipment: Agricultural consultant (2 wheels);Cane - quad Additional Comments: Reports grandson lives with her    Prior Function Prior Level of Function : Needs assist       Physical Assist : Mobility (physical);ADLs (physical) Mobility (physical): Gait;Stairs ADLs (physical): IADLs Mobility Comments: short distance ambulation using RW with supervision outdoors ADLs Comments: Independent ADLs, but hard; assist w/ IADLS     Extremity/Trunk Assessment   Upper Extremity Assessment Upper Extremity Assessment: Defer to OT evaluation    Lower Extremity Assessment Lower Extremity Assessment: Generalized weakness    Cervical / Trunk Assessment Cervical / Trunk Assessment: Normal  Communication   Communication Communication: No apparent difficulties    Cognition Arousal: Alert Behavior During Therapy: WFL for tasks assessed/performed   PT - Cognitive impairments: No apparent impairments                         Following commands: Intact       Cueing Cueing Techniques: Verbal cues, Tactile cues     General Comments      Exercises     Assessment/Plan    PT Assessment Patient needs continued PT services  PT Problem List Decreased strength;Decreased balance;Decreased mobility;Decreased activity tolerance       PT Treatment Interventions DME instruction;Therapeutic activities;Gait training;Therapeutic exercise;Patient/family education;Balance training;Functional mobility training    PT Goals (Current goals can be found in the Care Plan section)  Acute Rehab PT  Goals Patient Stated Goal: return home PT Goal Formulation: With patient Time For Goal Achievement: 10/30/23 Potential to Achieve Goals: Fair    Frequency Min 3X/week     Co-evaluation PT/OT/SLP Co-Evaluation/Treatment: Yes Reason for Co-Treatment: To address functional/ADL transfers PT goals addressed during session: Mobility/safety with mobility;Balance;Proper use of DME         AM-PAC PT 6 Clicks Mobility  Outcome Measure Help needed turning from your back to your side while in a flat bed without using bedrails?: A Lot Help needed moving from lying on your back to sitting on the side of a flat bed without using bedrails?: A Lot Help needed moving to and from a bed to a chair (including a wheelchair)?: A Lot Help needed standing up from a chair using your arms (e.g., wheelchair or bedside chair)?: A Lot Help needed to walk in hospital room?: A Lot Help needed climbing 3-5 steps with a railing? : Total 6 Click Score: 11    End of Session Equipment Utilized During Treatment: Gait belt Activity Tolerance: Patient limited by fatigue;Patient limited by pain Patient left: in chair;with call bell/phone within reach Nurse Communication: Mobility status PT Visit Diagnosis: Muscle weakness (generalized) (M62.81);Other abnormalities of gait and mobility (R26.89);Unsteadiness on feet (R26.81)    Time: 9179-9143 PT Time Calculation (min) (ACUTE ONLY): 36 min  Charges:   PT Evaluation $PT Eval Moderate Complexity: 1 Mod PT Treatments $Therapeutic Activity: 23-37 mins PT General Charges $$ ACUTE PT VISIT: 1 Visit        2:02 PM, 10/16/23,  Onnie Como, SPT

## 2023-10-16 NOTE — [place] FL2 (Signed)
   MEDICAID [id number] LEVEL OF CARE FORM     IDENTIFICATION  Patient Name: Angela [name] Birthdate: 09/22/1955 Sex: female Admission Date (Current Location): 10/14/2023  [facility] and [place] Number:  [employer] and Address:  [facility],  [address], [place] [address]      Provider Number: [id number]  Attending Physician Name and Address:  Willette Adriana LABOR, MD  Relative Name and Phone Number:       Current Level of Care: Hospital Recommended Level of Care: Skilled Nursing Facility Prior Approval Number:    Date Approved/Denied:   PASRR Number:    Discharge Plan: SNF    Current Diagnoses: Patient Active Problem List   Diagnosis Date Noted   Sepsis (HCC) 10/14/2023   Pneumonia due to COVID-19 virus 02/10/2020   Thrombocytopenia (HCC) 02/10/2020   Hypoalbuminemia 02/10/2020   AKI (acute kidney injury) (HCC) 02/10/2020   Acute respiratory failure with hypoxia (HCC) 02/10/2020   Candidal intertrigo 02/10/2020   Hyperlipidemia 06/06/2016   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   Hypothyroid 06/06/2016   Atrial fibrillation (HCC) 06/06/2016   Morbid obesity (HCC) 06/06/2016   Chronic lymphocytic thyroiditis 06/06/2016    Orientation RESPIRATION BLADDER Height & Weight     Self, Time, Situation, Place  Normal Incontinent Weight: (!) 410 lb 7.9 oz (186.2 kg) Height:  5' 11 (180.3 cm)  BEHAVIORAL SYMPTOMS/MOOD NEUROLOGICAL BOWEL NUTRITION STATUS      Continent Diet (Heart healthy)  AMBULATORY STATUS COMMUNICATION OF NEEDS Skin   Extensive Assist Verbally Normal                       Personal Care Assistance Level of Assistance  Bathing, Dressing, Feeding Bathing Assistance: Limited assistance Feeding assistance: Independent Dressing Assistance: Limited assistance     Functional Limitations Info  Sight, Hearing, Speech Sight Info: Adequate Hearing Info: Adequate Speech Info: Adequate    SPECIAL CARE FACTORS FREQUENCY  PT  (By licensed PT), OT (By licensed OT)     PT Frequency: 5 times weekly OT Frequency: 5 times weekly            Contractures Contractures Info: Not present    Additional Factors Info  Code Status, Allergies Code Status Info: DNR-Limited Allergies Info: Ibuprofen           Current Medications (10/16/2023):  This is the current hospital active medication list Current Facility-Administered Medications  Medication Dose Route Frequency Provider Last Rate Last Admin   apixaban  (ELIQUIS ) tablet 5 mg  5 mg Oral BID Stinson, Jacob J, DO   5 mg at 10/16/23 9193   ceFEPIme  (MAXIPIME ) 2 g in sodium chloride  0.9 % 100 mL IVPB  2 g Intravenous Q12H Stinson, Jacob J, DO   Stopped at 10/16/23 1306   Chlorhexidine  Gluconate Cloth 2 % PADS 6 each  6 each Topical Daily Dena Charleston, MD   6 each at 10/16/23 9193   HYDROmorphone  (DILAUDID ) injection 1 mg  1 mg Intravenous Q3H PRN Shahmehdi, Seyed A, MD       levothyroxine  (SYNTHROID ) tablet 150 mcg  150 mcg Oral Daily Stinson, Jacob J, DO   150 mcg at 10/16/23 9352   metroNIDAZOLE  (FLAGYL ) IVPB 500 mg  500 mg Intravenous Q12H Stinson, Jacob J, DO 100 mL/hr at 10/16/23 0647 500 mg at 10/16/23 9352   nystatin  (MYCOSTATIN /NYSTOP ) topical powder 1 Application  1 Application Topical TID PRN Stinson, Jacob J, DO       ondansetron  (ZOFRAN )  tablet 4 mg  4 mg Oral Q6H PRN Stinson, Jacob J, DO       Or   ondansetron  (ZOFRAN ) injection 4 mg  4 mg Intravenous Q6H PRN Stinson, Jacob J, DO       oxyCODONE  (Oxy IR/ROXICODONE ) immediate release tablet 5 mg  5 mg Oral Q6H PRN Shahmehdi, Seyed A, MD   5 mg at 10/16/23 0806   polyethylene glycol (MIRALAX  / GLYCOLAX ) packet 17 g  17 g Oral Daily PRN Stinson, Jacob J, DO       simvastatin  (ZOCOR ) tablet 20 mg  20 mg Oral QHS Stinson, Jacob J, DO   20 mg at 10/15/23 2105   zolpidem  (AMBIEN ) tablet 5 mg  5 mg Oral QHS Stinson, Jacob J, DO   5 mg at 10/15/23 2105     Discharge Medications: Please see discharge summary  for a list of discharge medications.  Relevant Imaging Results:  Relevant Lab Results:   Additional Information SSN: [id number] [place]

## 2023-10-16 NOTE — Plan of Care (Signed)
  Problem: Acute Rehab PT Goals(only PT should resolve) Goal: Pt Will Go Supine/Side To Sit Outcome: Progressing Flowsheets (Taken 10/16/2023 1403) Pt will go Supine/Side to Sit: with moderate assist Goal: Patient Will Transfer Sit To/From Stand Outcome: Progressing Flowsheets (Taken 10/16/2023 1403) Patient will transfer sit to/from stand: with moderate assist Goal: Pt Will Transfer Bed To Chair/Chair To Bed Outcome: Progressing Flowsheets (Taken 10/16/2023 1403) Pt will Transfer Bed to Chair/Chair to Bed: with mod assist Goal: Pt Will Ambulate Outcome: Progressing Flowsheets (Taken 10/16/2023 1403) Pt will Ambulate:  with minimal assist  with moderate assist  25 feet  with rolling walker

## 2023-10-16 NOTE — Plan of Care (Signed)

## 2023-10-17 DIAGNOSIS — R6520 Severe sepsis without septic shock: Secondary | ICD-10-CM | POA: Diagnosis not present

## 2023-10-17 DIAGNOSIS — N179 Acute kidney failure, unspecified: Secondary | ICD-10-CM | POA: Diagnosis not present

## 2023-10-17 DIAGNOSIS — A419 Sepsis, unspecified organism: Secondary | ICD-10-CM | POA: Diagnosis not present

## 2023-10-17 LAB — CBC
HCT: 33.8 % — ABNORMAL LOW (ref 36.0–46.0)
Hemoglobin: 10.3 g/dL — ABNORMAL LOW (ref 12.0–15.0)
MCH: 26.8 pg (ref 26.0–34.0)
MCHC: 30.5 g/dL (ref 30.0–36.0)
MCV: 87.8 fL (ref 80.0–100.0)
Platelets: 158 K/uL (ref 150–400)
RBC: 3.85 MIL/uL — ABNORMAL LOW (ref 3.87–5.11)
RDW: 17.3 % — ABNORMAL HIGH (ref 11.5–15.5)
WBC: 12.8 K/uL — ABNORMAL HIGH (ref 4.0–10.5)
nRBC: 0 % (ref 0.0–0.2)

## 2023-10-17 LAB — BASIC METABOLIC PANEL WITH GFR
Anion gap: 7 (ref 5–15)
BUN: 38 mg/dL — ABNORMAL HIGH (ref 8–23)
CO2: 23 mmol/L (ref 22–32)
Calcium: 8.3 mg/dL — ABNORMAL LOW (ref 8.9–10.3)
Chloride: 109 mmol/L (ref 98–111)
Creatinine, Ser: 1.87 mg/dL — ABNORMAL HIGH (ref 0.44–1.00)
GFR, Estimated: 29 mL/min — ABNORMAL LOW (ref >60)
Glucose, Bld: 118 mg/dL — ABNORMAL HIGH (ref 70–99)
Potassium: 4.4 mmol/L (ref 3.5–5.1)
Sodium: 139 mmol/L (ref 135–145)

## 2023-10-17 MED ORDER — METRONIDAZOLE 500 MG PO TABS
500.0000 mg | ORAL_TABLET | Freq: Three times a day (TID) | ORAL | Status: DC
  Administered 2023-10-17 – 2023-10-18 (×8): 500 mg via ORAL
  Filled 2023-10-17 (×4): qty 1

## 2023-10-17 NOTE — Patient Instructions (Signed)

## 2023-10-17 NOTE — Progress Notes (Signed)
 Occupational Therapy Treatment Patient Details Name: Angela [name] MRN: [id number] DOB: December 03, 1955 Today's Date: 10/17/2023   History of present illness Angela [name] is a 68 y.o. female with medical history significant of morbid obesity, chronic venous stasis, atrial fibrillation on anticoagulation, hypertension, hypothyroidism.  Patient seen for chills, hypotension, fatigue since Monday.  She has had decreased oral intake along with 2 episodes of diarrhea over the last 4 hours.  Her symptoms are worsening.  Here, she was noted to have soft blood pressures.  She was briefly hypoxic and was placed on nasal cannula for couple of hours, then her oxygen  levels returned back to normal.  Seen she has noted some redness in the left lower leg that is mildly tender.   OT comments  Pt agreeable to OT treatment today. Pt seated in the chair for the duration of the session. HEP given on B UE shoulder strengthening. Pt able to complete 10 reps of several exercises with verbal and gesture cuing. Pt educated on adaptive dressing strategies and sock aid was partially modeled. Not attempted today due to pt's B LE pain and swelling. Pt left in the chair with call bell within reach. Pt will benefit from continued OT in the hospital and recommended venue below to increase strength, balance, and endurance for safe ADL's.         If plan is discharge home, recommend the following:  A lot of help with walking and/or transfers;A lot of help with bathing/dressing/bathroom;Assistance with cooking/housework;Assist for transportation;Help with stairs or ramp for entrance   Equipment Recommendations  None recommended by OT          Precautions / Restrictions Precautions Precautions: Fall Recall of Precautions/Restrictions: Intact Restrictions Weight Bearing Restrictions Per Provider Order: No       Mobility Bed Mobility               General bed mobility comments: Pt seated in the chair to start the  session.    Transfers                   General transfer comment: Pt upright seated in the chair at start of session.     Balance       Sitting balance - Comments: Pt seated with back support in the chair for the duration of the session.                                   ADL either performed or assessed with clinical judgement   ADL Overall ADL's : Needs assistance/impaired                                       General ADL Comments: Pt educated on sock aid, reacher, long handled sponge, and dressing stick. Models given when appropriate. Not attempted today due to pt's leg pain.     Communication Communication Communication: No apparent difficulties   Cognition Arousal: Alert Behavior During Therapy: WFL for tasks assessed/performed Cognition: No apparent impairments                               Following commands: Intact        Cueing   Cueing Techniques: Verbal cues, Gestural cues  Exercises Exercises: General Upper Extremity, Shoulder General  Exercises - Upper Extremity Shoulder Horizontal ABduction: AROM, Both, 10 reps, Seated (x10 protraction as well) Shoulder Exercises Shoulder Flexion: AROM, 10 reps, Both, Seated Shoulder ABduction: AROM, 10 reps, Both, Seated Shoulder External Rotation: 10 reps, AROM, Both, Seated    Shoulder Instructions       General Comments      Pertinent Vitals/ Pain       Pain Assessment Pain Assessment: Faces Faces Pain Scale: Hurts a little bit Pain Location: legs and neck Pain Descriptors / Indicators: Sore, Discomfort Pain Intervention(s): Monitored during session                                                          Frequency  Min 3X/week        Progress Toward Goals  OT Goals(current goals can now be found in the care plan section)  Progress towards OT goals: Progressing toward goals  Acute Rehab OT Goals Patient Stated Goal:  improve function OT Goal Formulation: With patient Time For Goal Achievement: 10/30/23 Potential to Achieve Goals: Good ADL Goals Pt Will Perform Grooming: with modified independence;standing Pt Will Perform Lower Body Bathing: with modified independence;sitting/lateral leans;with adaptive equipment Pt Will Perform Lower Body Dressing: with modified independence;with adaptive equipment;sitting/lateral leans Pt Will Transfer to Toilet: with contact guard assist;stand pivot transfer Pt Will Perform Toileting - Clothing Manipulation and hygiene: with modified independence;sitting/lateral leans;with adaptive equipment Pt/caregiver will Perform Home Exercise Program: Increased strength;Both right and left upper extremity;Independently  Plan                                      End of Session    OT Visit Diagnosis: Unsteadiness on feet (R26.81);Other abnormalities of gait and mobility (R26.89);Muscle weakness (generalized) (M62.81)   Activity Tolerance Patient tolerated treatment well   Patient Left in chair;with call bell/phone within reach   Nurse Communication          Time: 1008-1030 OT Time Calculation (min): 22 min  Charges: OT General Charges $OT Visit: 1 Visit OT Treatments $Therapeutic Exercise: 8-22 mins  Lajoyce Tamura OT, MOT   Jayson Person 10/17/2023, 12:22 PM

## 2023-10-17 NOTE — Progress Notes (Signed)
 Physical Therapy Treatment Patient Details Name: Angela [name] MRN: [id number] DOB: Apr 05, 1955 Today's Date: 10/17/2023   History of Present Illness Angela [name] is a 68 y.o. female with medical history significant of morbid obesity, chronic venous stasis, atrial fibrillation on anticoagulation, hypertension, hypothyroidism.  Patient seen for chills, hypotension, fatigue since Monday.  She has had decreased oral intake along with 2 episodes of diarrhea over the last 4 hours.  Her symptoms are worsening.  Here, she was noted to have soft blood pressures.  She was briefly hypoxic and was placed on nasal cannula for couple of hours, then her oxygen  levels returned back to normal.  Seen she has noted some redness in the left lower leg that is mildly tender.    PT Comments  Patient demonstrates slow labored movement for sitting up at bedside having to use bed rail with HOB partially raised, increased tolerance for taking side steps at bedside, able to transfer to/from [facility] and then to chair and demonstrated fair/good return for completing BLE exercises while seated in chair. Patient tolerated sitting up in chair after therapy. Patient will benefit from continued skilled physical therapy in hospital and recommended venue below to increase strength, balance, endurance for safe ADLs and gait.       If plan is discharge home, recommend the following: A lot of help with bathing/dressing/bathroom;A lot of help with walking and/or transfers;Help with stairs or ramp for entrance;Assist for transportation   Can travel by private vehicle     No  Equipment Recommendations  None recommended by PT    Recommendations for Other Services       Precautions / Restrictions Precautions Precautions: Fall Recall of Precautions/Restrictions: Intact Restrictions Weight Bearing Restrictions Per Provider Order: No     Mobility  Bed Mobility Overal bed mobility: Needs Assistance Bed Mobility: Supine to Sit      Supine to sit: Mod assist     General bed mobility comments: increased time, labored movement    Transfers Overall transfer level: Needs assistance Equipment used: Rolling walker (2 wheels) Transfers: Sit to/from Stand, Bed to chair/wheelchair/BSC Sit to Stand: Mod assist Stand pivot transfers: Mod assist         General transfer comment: unstead labored movement trasnferring to/from [facility] and to chari requiring frequent verbal cueing for safety    Ambulation/Gait Ambulation/Gait assistance: Mod assist, Max assist Gait Distance (Feet): 6 Feet Assistive device: Rolling walker (2 wheels) Gait Pattern/deviations: Decreased step length - right, Decreased step length - left, Decreased stride length, Trunk flexed Gait velocity: slow     General Gait Details: slightly increased tolerance for taking side steps at bedside with slow labored unsteady movement with trunk flexed   Stairs             Wheelchair Mobility     Tilt Bed    Modified Rankin (Stroke Patients Only)       Balance Overall balance assessment: Needs assistance Sitting-balance support: Feet supported, No upper extremity supported Sitting balance-Leahy Scale: Fair Sitting balance - Comments: fair/good seated at EOB   Standing balance support: Reliant on assistive device for balance, During functional activity, Bilateral upper extremity supported Standing balance-Leahy Scale: Poor Standing balance comment: fair/poor using RW                            Communication Communication Communication: No apparent difficulties  Cognition Arousal: Alert Behavior During Therapy: WFL for tasks assessed/performed   PT -  Cognitive impairments: No apparent impairments                         Following commands: Intact      Cueing Cueing Techniques: Verbal cues, Tactile cues  Exercises General Exercises - Lower Extremity Long Arc Quad: Seated, AROM, Strengthening, Both, 10  reps Hip Flexion/Marching: Seated, AROM, Strengthening, Both, 10 reps Toe Raises: Seated, AROM, Strengthening, Both, 10 reps Heel Raises: Seated, AROM, Strengthening, Both, 10 reps    General Comments        Pertinent Vitals/Pain Pain Assessment Pain Assessment: Faces Faces Pain Scale: Hurts little more Pain Location: legs Pain Descriptors / Indicators: Sore, Discomfort Pain Intervention(s): Limited activity within patient's tolerance, Monitored during session, Repositioned    Home Living                          Prior Function            PT Goals (current goals can now be found in the care plan section) Acute Rehab PT Goals PT Goal Formulation: With patient Time For Goal Achievement: 10/30/23 Potential to Achieve Goals: Good Progress towards PT goals: Progressing toward goals    Frequency    Min 3X/week      PT Plan      Co-evaluation              AM-PAC PT 6 Clicks Mobility   Outcome Measure  Help needed turning from your back to your side while in a flat bed without using bedrails?: A Lot Help needed moving from lying on your back to sitting on the side of a flat bed without using bedrails?: A Lot Help needed moving to and from a bed to a chair (including a wheelchair)?: A Lot Help needed standing up from a chair using your arms (e.g., wheelchair or bedside chair)?: A Lot Help needed to walk in hospital room?: A Lot Help needed climbing 3-5 steps with a railing? : Total 6 Click Score: 11    End of Session   Activity Tolerance: Patient tolerated treatment well;Patient limited by fatigue Patient left: in chair;with call bell/phone within reach Nurse Communication: Mobility status PT Visit Diagnosis: Muscle weakness (generalized) (M62.81);Other abnormalities of gait and mobility (R26.89);Unsteadiness on feet (R26.81)     Time: 9154-9075 PT Time Calculation (min) (ACUTE ONLY): 39 min  Charges:    $Therapeutic Exercise: 8-22  mins $Therapeutic Activity: 8-22 mins PT General Charges $$ ACUTE PT VISIT: 1 Visit                     12:06 PM, 10/17/23 Lynwood Music, MPT Physical Therapist with [facility] [phone] office [phone] mobile phone

## 2023-10-17 NOTE — Progress Notes (Signed)
 PROGRESS NOTE    Patient: Angela [name]                            PCP: Marvine Rush, MD                    DOB: 02-28-1956            DOA: 10/14/2023 FMW:[id number]             DOS: 10/17/2023, 12:28 PM   LOS: 3 days   Date of Service: The patient was seen and examined on 10/17/2023  Subjective:   The patient was seen and examined this morning, stable no acute distress Reported she did not sleep well overnight-she forgot to mention that she has obstructive sleep apnea she uses CPAP at home Hemodynamically stable  Brief Narrative:   Angela [name] is a 68 y.o. female with medical history significant of morbid obesity, chronic venous stasis, atrial fibrillation on anticoagulation, hypertension, hypothyroidism.  Patient seen for chills, hypotension, fatigue since Monday.  She has had decreased oral intake along with 2 episodes of diarrhea over the last 4 hours.  Her symptoms are worsening.  Here, she was noted to have soft blood pressures.  She was briefly hypoxic and was placed on nasal cannula for couple of hours, then her oxygen  levels returned back to normal.  Seen she has noted some redness in the left lower leg that is mildly tender.   Principal Problem:   Sepsis (HCC) Active Problems:   Atrial fibrillation (HCC)   Acute respiratory failure with hypoxia (HCC)   Hyperlipidemia   Hypothyroid   Morbid obesity (HCC)   Organic sleep apnea  Sepsis -secondary to cellulitis /pannus - Resolved sepsis physiology  - Blood cultures: No growth to date -.  UA reviewed moderate leukocytes few bacteria WBC 21-50 Urine culture <10K colonies insignificant growth -Was initiated on broad-spectrum antibiotics cefepime /metronidazole -will continue De-escalating antibiotics in a.m.  - Continue wound care per nursing staff  Lactic acidosis -  improved =lactic acidosis 2.3>> 1.4 - Continue IVF  Atrial fibrillation - History of A-fib, heart rate 71-114, episode of bradycardia -Not on any  rate control medications -Continue Eliquis   Acute renal insufficiency ?  Acute on chronic CKD - S/p IVF hydration  - avoiding hypotension, avoiding nephrotoxins  Morbid obesity with chronic venous stasis -Once improved sepsis physiology, will likely need diuretics and diuresing  Hypothyroidism -Continue home dose Synthroid   Morbid obesity  Body mass index is 57.25 kg/m.  Counseled regarding weight management, follow-up with the PCP regarding weight loss programs Increasing activity exercise,   Nutritional status:  The patient's BMI is: Body mass index is 57.25 kg/m. I agree with the assessment and plan as outlined. caloric intake evaluation recommendations   Skin Assessment: I have examined the patient's skin and I agree with the wound assessment as performed by wound care team Extensive bilateral lower extremity erythema edema Right flank area extensive erythema  -------------------------------------------------------------------------------------------------------------- Cultures; Blood Cultures x 2 ----  no growth to date Urine Culture > 10K colonies, insignificant growth --------------------------------------------------------------------------------------------------------------  DVT prophylaxis:   apixaban  (ELIQUIS ) tablet 5 mg   Code Status:   Code Status: Limited: Do not attempt resuscitation (DNR) -DNR-LIMITED -Do Not Intubate/DNI   Family Communication: No family member present at bedside-  -Advance care planning has been discussed.   Admission status:   Status is: Inpatient Remains inpatient appropriate because: Meeting sepsis criteria, needing IVF,  IV antibiotics   Disposition: From  - home             Planning for discharge in 1-2 days  Home w [facility]   Procedures:   No admission procedures for hospital encounter.   Antimicrobials:  Anti-infectives (From admission, onward)    Start     Dose/Rate Route Frequency Ordered Stop   10/15/23 0400   ceFEPIme  (MAXIPIME ) 2 g in sodium chloride  0.9 % 100 mL IVPB        2 g 200 mL/hr over 30 Minutes Intravenous Every 12 hours 10/14/23 1744     10/14/23 1845  metroNIDAZOLE  (FLAGYL ) IVPB 500 mg        500 mg 100 mL/hr over 60 Minutes Intravenous Every 12 hours 10/14/23 1745     10/14/23 1545  ceFEPIme  (MAXIPIME ) 2 g in sodium chloride  0.9 % 100 mL IVPB        2 g 200 mL/hr over 30 Minutes Intravenous  Once 10/14/23 1543 10/14/23 1740   10/14/23 1545  metroNIDAZOLE  (FLAGYL ) IVPB 500 mg        500 mg 100 mL/hr over 60 Minutes Intravenous  Once 10/14/23 1543 10/14/23 1740        Medication:   apixaban   5 mg Oral BID   Chlorhexidine  Gluconate Cloth  6 each Topical Daily   levothyroxine   150 mcg Oral Daily   simvastatin   20 mg Oral QHS   zolpidem   5 mg Oral QHS    HYDROmorphone  (DILAUDID ) injection, nystatin , ondansetron  **OR** ondansetron  (ZOFRAN ) IV, oxyCODONE , polyethylene glycol   Objective:   Vitals:   10/17/23 0000 10/17/23 0441 10/17/23 0600 10/17/23 0735  BP: 125/70  126/78   Pulse: 94  (!) 43   Resp: (!) 21  18   Temp:  98.1 F (36.7 C)  98.2 F (36.8 C)  TempSrc:  Oral  Oral  SpO2: 93%  94%   Weight:      Height:        Intake/Output Summary (Last 24 hours) at 10/17/2023 1228 Last data filed at 10/17/2023 1227 Gross per 24 hour  Intake 644.8 ml  Output --  Net 644.8 ml   Filed Weights   10/14/23 1427 10/14/23 1759  Weight: (!) 190 kg (!) 186.2 kg     Physical examination:    General:  AAO x 3,  cooperative, no distress;   HEENT:  Normocephalic, PERRL, otherwise with in Normal limits   Neuro:  CNII-XII intact. , normal motor and sensation, reflexes intact   Lungs:   Clear to auscultation BL, Respirations unlabored,  No wheezes / crackles  Cardio:    S1/S2, RRR, No murmure, No Rubs or Gallops   Abdomen:  Soft, non-tender, bowel sounds active all four quadrants, no guarding or peritoneal signs.  Muscular  skeletal:  Limited exam -global generalized  weaknesses - in bed, able to move all 4 extremities,   2+ pulses,  symmetric, +3  pitting edema  Skin:  Dry, warm to touch, bilateral lower extremity erythema edema, back right flank area erythema edema,(  see Pics )  Wounds: Please see documentation  Media Information - right flank  Document Information  Media Information left leg  Document Information  Media Information right leg  Document Information  ------------------------------------------------------------------------------------------------------------------------------------------    LABs:     Latest Ref Rng & Units 10/17/2023    4:58 AM 10/16/2023    4:25 AM 10/15/2023    3:17 AM  CBC  WBC 4.0 -  10.5 K/uL 12.8  12.9  10.4   Hemoglobin 12.0 - 15.0 g/dL 89.6  89.2  89.7   Hematocrit 36.0 - 46.0 % 33.8  33.7  32.0   Platelets 150 - 400 K/uL 158  123  116       Latest Ref Rng & Units 10/17/2023    4:58 AM 10/16/2023    4:25 AM 10/15/2023    3:17 AM  CMP  Glucose 70 - 99 mg/dL 881  881  898   BUN 8 - 23 mg/dL 38  47  51   Creatinine 0.44 - 1.00 mg/dL 8.12  7.57  7.15   Sodium 135 - 145 mmol/L 139  139  138   Potassium 3.5 - 5.1 mmol/L 4.4  4.1  3.8   Chloride 98 - 111 mmol/L 109  111  109   CO2 22 - 32 mmol/L 23  21  18    Calcium 8.9 - 10.3 mg/dL 8.3  8.2  8.0        Micro Results Recent Results (from the past 240 hours)  Blood Culture (routine x 2)     Status: None (Preliminary result)   Collection Time: 10/14/23  2:27 PM   Specimen: Right Antecubital; Blood  Result Value Ref Range Status   Specimen Description   Final    RIGHT ANTECUBITAL BOTTLES DRAWN AEROBIC AND ANAEROBIC   Special Requests   Final    Blood Culture results may not be optimal due to an inadequate volume of blood received in culture bottles   Culture   Final    NO GROWTH 3 DAYS Performed at [facility], [address] [place], [place] [address]    Report Status PENDING  Incomplete  Urine Culture     Status: Abnormal   Collection  Time: 10/14/23  2:43 PM   Specimen: Urine, Random  Result Value Ref Range Status   Specimen Description   Final    URINE, RANDOM Performed at [facility], [address], [place], [place] [address]    Special Requests   Final    NONE Reflexed from [id number] Performed at [facility], [address] [place], [place] [address]    Culture (A)  Final    <10,000 COLONIES/mL INSIGNIFICANT GROWTH Performed at [facility], [address], [place], [place] [address]    Report Status 10/15/2023 FINAL  Final  Blood Culture (routine x 2)     Status: None (Preliminary result)   Collection Time: 10/14/23  3:41 PM   Specimen: BLOOD LEFT HAND  Result Value Ref Range Status   Specimen Description BLOOD LEFT HAND AEROBIC BOTTLE ONLY  Final   Special Requests   Final    Blood Culture results may not be optimal due to an inadequate volume of blood received in culture bottles   Culture   Final    NO GROWTH 3 DAYS Performed at [facility], [address] [place], [place] [address]    Report Status PENDING  Incomplete  MRSA Next Gen by PCR, Nasal     Status: None   Collection Time: 10/14/23  5:50 PM   Specimen: Nasal Mucosa; Nasal Swab  Result Value Ref Range Status   MRSA by PCR Next Gen NOT DETECTED NOT DETECTED Final    Comment: (NOTE) The GeneXpert MRSA Assay (FDA approved for NASAL specimens only), is one component of a comprehensive MRSA colonization surveillance program. It is not intended to diagnose MRSA infection nor to guide or monitor  treatment for MRSA infections. Test performance is not FDA approved in patients less than 24 years old. Performed at [facility], [address], [place], [place] [address]     Radiology Reports No results found.   SIGNED: Adriana DELENA Grams, MD, FHM. FAAFP. [facility]ol[facility] - [facility] hospitalist  Critical care time spent - 55 min.  In seeing, evaluating and examining the patient. Reviewing medical records, labs, drawn plan of care. [facility],  Pager (please use [email or web address] to page/ text) Please use Epic Secure Chat for non-urgent communication (7AM-7PM)  If 7PM-7AM, please contact night-coverage [email or web address], 10/17/2023, 12:28 PM

## 2023-10-17 NOTE — TOC Progression Note (Signed)
 Transition of Care [facility]) - Progression Note    Patient Details  Name: Angela [name] MRN: [id number] Date of Birth: 06/27/1955  [facility] CM/SW Contact  Lucie Lunger, [place] Phone Number: 10/17/2023, 12:12 PM  Clinical Narrative:    CSW reviewed bed offers with pt at bedside, pt states she wants to speak with her son and review. CSW explained that I will follow up this afternoon to see if choice has been made. TOC to follow.   Expected Discharge Plan: Skilled Nursing Facility Barriers to Discharge: Continued Medical Work up               Expected Discharge Plan and Services In-house Referral: Clinical Social Work Discharge Planning Services: CM Consult Post Acute Care Choice: Skilled Nursing Facility Living arrangements for the past 2 months: Single Family Home                                       Social Drivers of Health (SDOH) Interventions SDOH Screenings   Food Insecurity: No Food Insecurity (10/14/2023)  Housing: Low Risk  (10/14/2023)  Transportation Needs: No Transportation Needs (10/14/2023)  Utilities: Not At Risk (10/14/2023)  Social Connections: Socially Isolated (10/14/2023)  Tobacco Use: Low Risk  (10/14/2023)    Readmission Risk Interventions    10/15/2023    5:32 PM  Readmission Risk Prevention Plan  Transportation Screening Complete  PCP or Specialist Appt within 5-7 Days Complete  Home Care Screening Complete  Medication Review (RN CM) Complete

## 2023-10-18 DIAGNOSIS — I4819 Other persistent atrial fibrillation: Secondary | ICD-10-CM

## 2023-10-18 DIAGNOSIS — E039 Hypothyroidism, unspecified: Secondary | ICD-10-CM | POA: Diagnosis not present

## 2023-10-18 DIAGNOSIS — A419 Sepsis, unspecified organism: Secondary | ICD-10-CM | POA: Diagnosis not present

## 2023-10-18 LAB — CBC
HCT: 31.8 % — ABNORMAL LOW (ref 36.0–46.0)
Hemoglobin: 9.9 g/dL — ABNORMAL LOW (ref 12.0–15.0)
MCH: 27 pg (ref 26.0–34.0)
MCHC: 31.1 g/dL (ref 30.0–36.0)
MCV: 86.9 fL (ref 80.0–100.0)
Platelets: 202 K/uL (ref 150–400)
RBC: 3.66 MIL/uL — ABNORMAL LOW (ref 3.87–5.11)
RDW: 17 % — ABNORMAL HIGH (ref 11.5–15.5)
WBC: 10.9 K/uL — ABNORMAL HIGH (ref 4.0–10.5)
nRBC: 0 % (ref 0.0–0.2)

## 2023-10-18 LAB — BASIC METABOLIC PANEL WITH GFR
Anion gap: 5 (ref 5–15)
BUN: 29 mg/dL — ABNORMAL HIGH (ref 8–23)
CO2: 24 mmol/L (ref 22–32)
Calcium: 8.3 mg/dL — ABNORMAL LOW (ref 8.9–10.3)
Chloride: 110 mmol/L (ref 98–111)
Creatinine, Ser: 1.46 mg/dL — ABNORMAL HIGH (ref 0.44–1.00)
GFR, Estimated: 39 mL/min — ABNORMAL LOW (ref >60)
Glucose, Bld: 92 mg/dL (ref 70–99)
Potassium: 4.3 mmol/L (ref 3.5–5.1)
Sodium: 139 mmol/L (ref 135–145)

## 2023-10-18 MED ORDER — LIDOCAINE 5 % EX PTCH
1.0000 | MEDICATED_PATCH | CUTANEOUS | Status: DC
  Administered 2023-10-18 – 2023-10-19 (×3): 1 via TRANSDERMAL
  Filled 2023-10-18 (×2): qty 1

## 2023-10-18 MED ORDER — METHOCARBAMOL 500 MG PO TABS
750.0000 mg | ORAL_TABLET | Freq: Three times a day (TID) | ORAL | Status: DC | PRN
  Administered 2023-10-18 (×2): 750 mg via ORAL
  Filled 2023-10-18: qty 2

## 2023-10-18 MED ORDER — SODIUM CHLORIDE 0.9 % IV SOLN
3.0000 g | Freq: Four times a day (QID) | INTRAVENOUS | Status: DC
  Administered 2023-10-18 – 2023-10-20 (×9): 3 g via INTRAVENOUS
  Filled 2023-10-18 (×6): qty 8

## 2023-10-18 MED ORDER — TORSEMIDE 20 MG PO TABS
20.0000 mg | ORAL_TABLET | Freq: Every day | ORAL | Status: DC
  Administered 2023-10-18 – 2023-10-20 (×4): 20 mg via ORAL
  Filled 2023-10-18 (×3): qty 1

## 2023-10-18 MED ORDER — ACETAMINOPHEN 325 MG PO TABS
650.0000 mg | ORAL_TABLET | Freq: Four times a day (QID) | ORAL | Status: DC | PRN
  Administered 2023-10-18 (×2): 650 mg via ORAL
  Filled 2023-10-18: qty 2

## 2023-10-18 MED ORDER — HYDROMORPHONE HCL 1 MG/ML IJ SOLN: 0.5000 mg | INTRAMUSCULAR | Status: DC | PRN

## 2023-10-18 NOTE — TOC Progression Note (Signed)
 Transition of Care [facility]) - Progression Note    Patient Details  Name: Angela [name] MRN: [id number] Date of Birth: 12-Jan-1956  Transition of Care [facility]) CM/SW Contact  Lucie Lunger, [place] Phone Number: 10/18/2023, 9:41 AM  Clinical Narrative:    CSW met with pt at bedside to see if bed choice has been made, she requested that CSW speak with her son to make final decision. CSW spoke to pts son, he states they would like to accept bed at [facility]. CSW spoke to Kia with SNF who states they will be able to accept pt once insurance shara has been received. Insurance shara is pending at this time. TOC to follow.   Expected Discharge Plan: Skilled Nursing Facility Barriers to Discharge: Continued Medical Work up               Expected Discharge Plan and Services In-house Referral: Clinical Social Work Discharge Planning Services: CM Consult Post Acute Care Choice: Skilled Nursing Facility Living arrangements for the past 2 months: Single Family Home                                       Social Drivers of Health (SDOH) Interventions SDOH Screenings   Food Insecurity: No Food Insecurity (10/14/2023)  Housing: Low Risk  (10/14/2023)  Transportation Needs: No Transportation Needs (10/14/2023)  Utilities: Not At Risk (10/14/2023)  Social Connections: Socially Isolated (10/14/2023)  Tobacco Use: Low Risk  (10/14/2023)    Readmission Risk Interventions    10/15/2023    5:32 PM  Readmission Risk Prevention Plan  Transportation Screening Complete  PCP or Specialist Appt within 5-7 Days Complete  Home Care Screening Complete  Medication Review (RN CM) Complete

## 2023-10-18 NOTE — Progress Notes (Signed)
 Mobility Specialist Progress Note:    10/18/23 1200  Mobility  Activity Pivoted/transferred to/from [facility];Pivoted/transferred from bed to chair  Level of Assistance Maximum assist, patient does 25-49%  Assistive Device Front wheel walker  Distance Ambulated (ft) 3 ft  Range of Motion/Exercises Active;All extremities  Activity Response Tolerated well  Mobility visit 1 Mobility  Mobility Specialist Start Time (ACUTE ONLY) 1120  Mobility Specialist Stop Time (ACUTE ONLY) 1135  Mobility Specialist Time Calculation (min) (ACUTE ONLY) 15 min   Nursing staff requested assistance. Required MaxA to stand and pivot with RW. Tolerated well, asx throughout. Left pt in chair with NT, all needs met.   Sherrilee Ditty Mobility Specialist Please contact via Special educational needs teacher or  Rehab office at [phone]

## 2023-10-18 NOTE — Plan of Care (Signed)

## 2023-10-18 NOTE — Plan of Care (Signed)
   Problem: Activity: Goal: Risk for activity intolerance will decrease Outcome: Progressing   Problem: Coping: Goal: Level of anxiety will decrease Outcome: Progressing

## 2023-10-18 NOTE — Progress Notes (Signed)
 Pt Son Angela [name] asked for doctor to call during rounds so that he may be updated on mother's care. Son is unable to attend rounding due to work but states he can answer a phone call when time. Will report of to oncoming shift.

## 2023-10-18 NOTE — Progress Notes (Signed)
 Physical Therapy Treatment Patient Details Name: Angela [name] MRN: [id number] DOB: January 25, 1956 Today's Date: 10/18/2023   History of Present Illness Angela [name] is a 68 y.o. female with medical history significant of morbid obesity, chronic venous stasis, atrial fibrillation on anticoagulation, hypertension, hypothyroidism.  Patient seen for chills, hypotension, fatigue since Monday.  She has had decreased oral intake along with 2 episodes of diarrhea over the last 4 hours.  Her symptoms are worsening.  Here, she was noted to have soft blood pressures.  She was briefly hypoxic and was placed on nasal cannula for couple of hours, then her oxygen  levels returned back to normal.  Seen she has noted some redness in the left lower leg that is mildly tender.    PT Comments  Patient pleasant and agreeable to PT session today. Patient demonstrates slow labored movement for sitting up at bedside requiring modA to advance legs to EOB and achieve full upright sitting. She requires multiple attempts to perform STS from elevated surface partially due to air mattress bed design, but mostly due to LE weakness. Patient with some c/o of feeling lightheaded and having an off day today but continues to be motivated. Patient limited to side stepping at bedside using RW due to BIL LE weakness and poor endurance.Patient left on [facility], call bell within reach and nursing notified to reach out if assist needed returning patient to bed/recliner. Patient will benefit from continued skilled physical therapy in hospital and recommended venue below to increase strength, balance, endurance for safe ADLs and gait.     If plan is discharge home, recommend the following: A lot of help with bathing/dressing/bathroom;A lot of help with walking and/or transfers;Help with stairs or ramp for entrance;Assist for transportation   Can travel by private vehicle     No  Equipment Recommendations  None recommended by PT     Recommendations for Other Services       Precautions / Restrictions Precautions Precautions: Fall Recall of Precautions/Restrictions: Intact Restrictions Weight Bearing Restrictions Per Provider Order: No     Mobility  Bed Mobility Overal bed mobility: Needs Assistance Bed Mobility: Supine to Sit     Supine to sit: Mod assist, Used rails, HOB elevated     General bed mobility comments: Pt able to pull trunk into upright postioning using bed rails, modA for advancing LEs and scooting EOB    Transfers Overall transfer level: Needs assistance Equipment used: Rolling walker (2 wheels) Transfers: Sit to/from Stand, Bed to chair/wheelchair/BSC Sit to Stand: Mod assist, Max assist   Step pivot transfers: Mod assist       General transfer comment: Pt required multiple attempts to stand from air mattress bed due to BIL LE weakness, verbal and tactile cues for full upright standing    Ambulation/Gait Ambulation/Gait assistance: Mod assist Gait Distance (Feet): 3 Feet Assistive device: Rolling walker (2 wheels) Gait Pattern/deviations: Trunk flexed Gait velocity: slow     General Gait Details: labored, unsteady movement; limited to side stepping at bedside due to c/o lightheadness & having a rough morning   Stairs             Wheelchair Mobility     Tilt Bed    Modified Rankin (Stroke Patients Only)       Balance Overall balance assessment: Needs assistance Sitting-balance support: Feet supported, No upper extremity supported Sitting balance-Leahy Scale: Fair Sitting balance - Comments: seated EOB   Standing balance support: Reliant on assistive device for balance, During functional  activity, Bilateral upper extremity supported Standing balance-Leahy Scale: Poor Standing balance comment: fair/poor using RW                            Communication Communication Communication: No apparent difficulties  Cognition Arousal: Alert Behavior  During Therapy: WFL for tasks assessed/performed   PT - Cognitive impairments: No apparent impairments                         Following commands: Intact      Cueing Cueing Techniques: Verbal cues, Tactile cues  Exercises      General Comments        Pertinent Vitals/Pain Pain Assessment Pain Assessment: Faces Faces Pain Scale: Hurts little more Pain Location: legs Pain Descriptors / Indicators: Sore, Discomfort Pain Intervention(s): Monitored during session    Home Living                          Prior Function            PT Goals (current goals can now be found in the care plan section) Acute Rehab PT Goals Patient Stated Goal: return home PT Goal Formulation: With patient Time For Goal Achievement: 10/30/23 Potential to Achieve Goals: Good Progress towards PT goals: Progressing toward goals    Frequency    Min 3X/week      PT Plan      Co-evaluation     PT goals addressed during session: Balance;Proper use of DME;Mobility/safety with mobility        AM-PAC PT 6 Clicks Mobility   Outcome Measure  Help needed turning from your back to your side while in a flat bed without using bedrails?: A Lot Help needed moving from lying on your back to sitting on the side of a flat bed without using bedrails?: A Lot Help needed moving to and from a bed to a chair (including a wheelchair)?: A Lot Help needed standing up from a chair using your arms (e.g., wheelchair or bedside chair)?: A Lot Help needed to walk in hospital room?: A Lot Help needed climbing 3-5 steps with a railing? : Total 6 Click Score: 11    End of Session   Activity Tolerance: Patient tolerated treatment well;Patient limited by fatigue Patient left: with call bell/phone within reach;Other (comment) [facility]) Nurse Communication: Mobility status;Other (comment) (Notified patient on [facility], reach out if needed to return patient to bed) PT Visit Diagnosis: Muscle weakness  (generalized) (M62.81);Other abnormalities of gait and mobility (R26.89);Unsteadiness on feet (R26.81)     Time: 1040-1110 PT Time Calculation (min) (ACUTE ONLY): 30 min  Charges:    $Therapeutic Activity: 23-37 mins PT General Charges $$ ACUTE PT VISIT: 1 Visit                     11:37 AM, 10/18/23,  Onnie Como, SPT

## 2023-10-18 NOTE — Progress Notes (Signed)
 Pt Son Joey asked for doctor to call during rounds so that he may be updated on mother's care. Son is unable to attend rounding due to work but states he can answer a phone call when time. Will report of to oncoming shift.

## 2023-10-18 NOTE — Progress Notes (Signed)
 PROGRESS NOTE   Angela [name]  FMW:[id number] DOB: 04/29/1955 DOA: 10/14/2023 PCP: Marvine Rush, MD   Chief Complaint  Patient presents with   Hypotension   Level of care: Med-Surg  Brief Admission History:  Angela [name] is a 68 y.o. female with medical history significant of morbid obesity, chronic venous stasis, atrial fibrillation on anticoagulation, hypertension, hypothyroidism.  Patient seen for chills, hypotension, fatigue since Monday.  She has had decreased oral intake along with 2 episodes of diarrhea over the last 4 hours.  Her symptoms are worsening.  Here, she was noted to have soft blood pressures.  She was briefly hypoxic and was placed on nasal cannula for couple of hours, then her oxygen  levels returned back to normal.  Seen she has noted some redness in the left lower leg that is mildly tender.  Assessment and Plan:  Sepsis - secondary to cellulitis Cellulitis of legs  - changing antibiotics to Unasyn  per pharm D - continue supportive care, elevate extremities - Follow-up with the blood cultures  Lactic acidosis - resolved with treatment  - Continue IVF  Chronic Atrial fibrillation - History of A-fib, heart rate controlled  - Not on any rate control medications - Continue apixaban  for full anticoagulation   Acute renal insufficiency Acute on chronic CKD  - Continue IVF, avoiding hypotension, avoiding nephrotoxins  Morbid obesity with chronic venous stasis - torsemide  20 mg daily    Hypothyroidism - Continue home dose levothyroxine   DVT prophylaxis: apixaban  Code Status: DNR Family Communication: t/c to son Disposition: SNF placement pending    Consultants:   Procedures:   Antimicrobials:  Unasyn  8/13>>   Subjective: Pt reports her legs remains red and very tender.    Objective: Vitals:   10/17/23 2109 10/18/23 0352 10/18/23 0902 10/18/23 1250  BP: (!) 165/77 123/77 135/66 (!) 146/95  Pulse: 71 87 95 78  Resp: 20  20 20   Temp: 98 F  (36.7 C) 98.4 F (36.9 C) 98.1 F (36.7 C) 98.2 F (36.8 C)  TempSrc: Oral Oral Oral Oral  SpO2: 95% 93% 95% 95%  Weight:      Height:        Intake/Output Summary (Last 24 hours) at 10/18/2023 1427 Last data filed at 10/18/2023 0008 Gross per 24 hour  Intake 200 ml  Output --  Net 200 ml   Filed Weights   10/14/23 1427 10/14/23 1759  Weight: (!) 190 kg (!) 186.2 kg   Examination:  General exam: Appears calm and comfortable  Respiratory system: Clear to auscultation. Respiratory effort normal. Cardiovascular system: normal S1 & S2 heard. No JVD, murmurs, rubs, gallops or clicks. No pedal edema. Gastrointestinal system: Abdomen is nondistended, soft and nontender. No organomegaly or masses felt. Normal bowel sounds heard. Central nervous system: Alert and oriented. No focal neurological deficits. Extremities: chronic venous stasis edema BLEs.  Bilateral leg erythema and warmth and open skin lesions c/w active cellulitis.  Symmetric 5 x 5 power. Skin: No rashes, lesions or ulcers. Psychiatry: Judgement and insight appear normal. Mood & affect appropriate.   Data Reviewed: I have personally reviewed following labs and imaging studies  CBC: Recent Labs  Lab 10/14/23 1435 10/15/23 0317 10/16/23 0425 10/17/23 0458 10/18/23 0520  WBC 11.8* 10.4 12.9* 12.8* 10.9*  NEUTROABS 11.1*  --   --   --   --   HGB 11.2* 10.2* 10.7* 10.3* 9.9*  HCT 35.4* 32.0* 33.7* 33.8* 31.8*  MCV 87.6 86.3 87.1 87.8 86.9  PLT 111*  116* 123* 158 202    Basic Metabolic Panel: Recent Labs  Lab 10/14/23 1435 10/15/23 0317 10/16/23 0425 10/17/23 0458 10/18/23 0520  NA 137 138 139 139 139  K 4.2 3.8 4.1 4.4 4.3  CL 107 109 111 109 110  CO2 18* 18* 21* 23 24  GLUCOSE 91 101* 118* 118* 92  BUN 49* 51* 47* 38* 29*  CREATININE 2.87* 2.84* 2.42* 1.87* 1.46*  CALCIUM 8.5* 8.0* 8.2* 8.3* 8.3*    CBG: No results for input(s): GLUCAP in the last 168 hours.  Recent Results (from the past 240  hours)  Blood Culture (routine x 2)     Status: None (Preliminary result)   Collection Time: 10/14/23  2:27 PM   Specimen: Right Antecubital; Blood  Result Value Ref Range Status   Specimen Description   Final    RIGHT ANTECUBITAL BOTTLES DRAWN AEROBIC AND ANAEROBIC   Special Requests   Final    Blood Culture results may not be optimal due to an inadequate volume of blood received in culture bottles   Culture   Final    NO GROWTH 4 DAYS Performed at [facility], [address], [place], [place] [address]    Report Status PENDING  Incomplete  Urine Culture     Status: Abnormal   Collection Time: 10/14/23  2:43 PM   Specimen: Urine, Random  Result Value Ref Range Status   Specimen Description   Final    URINE, RANDOM Performed at [facility], [address], [place], [place] [address]    Special Requests   Final    NONE Reflexed from [id number] Performed at [facility], [address], [place], [place] [address]    Culture (A)  Final    <10,000 COLONIES/mL INSIGNIFICANT GROWTH Performed at [facility], [address] [place], [place] [address]    Report Status 10/15/2023 FINAL  Final  Blood Culture (routine x 2)     Status: None (Preliminary result)   Collection Time: 10/14/23  3:41 PM   Specimen: BLOOD LEFT HAND  Result Value Ref Range Status   Specimen Description BLOOD LEFT HAND AEROBIC BOTTLE ONLY  Final   Special Requests   Final    Blood Culture results may not be optimal due to an inadequate volume of blood received in culture bottles   Culture   Final    NO GROWTH 4 DAYS Performed at [facility], [address] [place], [place] [address]    Report Status PENDING  Incomplete  MRSA Next Gen by PCR, Nasal     Status: None   Collection Time: 10/14/23  5:50 PM   Specimen: Nasal Mucosa; Nasal Swab  Result Value Ref Range Status   MRSA by PCR Next Gen NOT DETECTED NOT DETECTED Final    Comment: (NOTE) The GeneXpert MRSA Assay (FDA approved for NASAL specimens  only), is one component of a comprehensive MRSA colonization surveillance program. It is not intended to diagnose MRSA infection nor to guide or monitor treatment for MRSA infections. Test performance is not FDA approved in patients less than 67 years old. Performed at [facility], [address] [place], [place] [address]      Radiology Studies: No results found.  Scheduled Meds:  apixaban   5 mg Oral BID   Chlorhexidine  Gluconate Cloth  6 each Topical Daily   levothyroxine   150 mcg Oral Daily   simvastatin   20 mg Oral QHS   torsemide   20 mg Oral Daily  zolpidem   5 mg Oral QHS    Continuous Infusions:   LOS: 4 days   Time spent: 56 mins  Koen Antilla Vicci, MD How to contact the [facility] Attending or Consulting provider 7A - 7P or covering provider during after hours 7P -7A, for this patient?  Check the care team in [facility] and look for a) attending/consulting TRH provider listed and b) the TRH team listed Log into [email or web address] to find provider on call.  Locate the TRH provider you are looking for under [facility] and page to a number that you can be directly reached. If you still have difficulty reaching the provider, please page the [facility] (Director on Call) for the Hospitalists listed on amion for assistance.  10/18/2023, 2:27 PM

## 2023-10-19 DIAGNOSIS — J9601 Acute respiratory failure with hypoxia: Secondary | ICD-10-CM | POA: Diagnosis not present

## 2023-10-19 DIAGNOSIS — I4819 Other persistent atrial fibrillation: Secondary | ICD-10-CM | POA: Diagnosis not present

## 2023-10-19 DIAGNOSIS — A419 Sepsis, unspecified organism: Secondary | ICD-10-CM | POA: Diagnosis not present

## 2023-10-19 DIAGNOSIS — E039 Hypothyroidism, unspecified: Secondary | ICD-10-CM | POA: Diagnosis not present

## 2023-10-19 DIAGNOSIS — E782 Mixed hyperlipidemia: Secondary | ICD-10-CM

## 2023-10-19 LAB — BASIC METABOLIC PANEL WITH GFR
Anion gap: 9 (ref 5–15)
BUN: 27 mg/dL — ABNORMAL HIGH (ref 8–23)
CO2: 24 mmol/L (ref 22–32)
Calcium: 8.2 mg/dL — ABNORMAL LOW (ref 8.9–10.3)
Chloride: 107 mmol/L (ref 98–111)
Creatinine, Ser: 1.51 mg/dL — ABNORMAL HIGH (ref 0.44–1.00)
GFR, Estimated: 38 mL/min — ABNORMAL LOW (ref >60)
Glucose, Bld: 98 mg/dL (ref 70–99)
Potassium: 4.1 mmol/L (ref 3.5–5.1)
Sodium: 140 mmol/L (ref 135–145)

## 2023-10-19 LAB — CBC
HCT: 34 % — ABNORMAL LOW (ref 36.0–46.0)
Hemoglobin: 10.4 g/dL — ABNORMAL LOW (ref 12.0–15.0)
MCH: 27 pg (ref 26.0–34.0)
MCHC: 30.6 g/dL (ref 30.0–36.0)
MCV: 88.3 fL (ref 80.0–100.0)
Platelets: 251 K/uL (ref 150–400)
RBC: 3.85 MIL/uL — ABNORMAL LOW (ref 3.87–5.11)
RDW: 16.6 % — ABNORMAL HIGH (ref 11.5–15.5)
WBC: 12 K/uL — ABNORMAL HIGH (ref 4.0–10.5)
nRBC: 0 % (ref 0.0–0.2)

## 2023-10-19 LAB — CULTURE, BLOOD (ROUTINE X 2)
Culture: NO GROWTH
Culture: NO GROWTH

## 2023-10-19 LAB — MAGNESIUM: Magnesium: 2 mg/dL (ref 1.7–2.4)

## 2023-10-19 MED ORDER — PROCHLORPERAZINE EDISYLATE 10 MG/2ML IJ SOLN
10.0000 mg | INTRAMUSCULAR | Status: DC | PRN
  Administered 2023-10-19: 10 mg via INTRAVENOUS
  Filled 2023-10-19: qty 2

## 2023-10-19 MED ORDER — SACCHAROMYCES BOULARDII 250 MG PO CAPS
250.0000 mg | ORAL_CAPSULE | Freq: Two times a day (BID) | ORAL | Status: DC
  Administered 2023-10-19 – 2023-10-20 (×3): 250 mg via ORAL
  Filled 2023-10-19 (×3): qty 1

## 2023-10-19 NOTE — Evaluation (Signed)
 Occupational Therapy Evaluation Patient Details Name: Angela [name] MRN: [id number] DOB: April 20, 1955 Today's Date: 10/19/2023   History of Present Illness   Angela [name] is a 68 y.o. female with medical history significant of morbid obesity, chronic venous stasis, atrial fibrillation on anticoagulation, hypertension, hypothyroidism.  Patient seen for chills, hypotension, fatigue since Monday.  She has had decreased oral intake along with 2 episodes of diarrhea over the last 4 hours.  Her symptoms are worsening.  Here, she was noted to have soft blood pressures.  She was briefly hypoxic and was placed on nasal cannula for couple of hours, then her oxygen  levels returned back to normal.  Seen she has noted some redness in the left lower leg that is mildly tender.     Clinical Impressions Pt agreeable to OT treatment. Pt seated on the [facility] ready to clean up and transfer. NT assisted with peri-care while the pt stood with this therapist using the RW at the [facility]. Mod A for sit to stand and subsequent steps to the chair once th pt was clean. Pt was standing for over 15 to 20 seconds and continued to the chair without a rest break. Pt then completed 2 sets of 10 reps of the below exercises. Pt was most fatigued by shoulder abduction and did require a rest break to finish the full 10 reps of the 2nd set. Pt left in the chair with call bell within reach. Pt will benefit from continued OT in the hospital and recommended venue below to increase strength, balance, and endurance for safe ADL's.        If plan is discharge home, recommend the following:   A lot of help with walking and/or transfers;A lot of help with bathing/dressing/bathroom;Assistance with cooking/housework;Assist for transportation;Help with stairs or ramp for entrance             Equipment Recommendations   None recommended by OT            Precautions/Restrictions   Precautions Precautions: Fall Recall of  Precautions/Restrictions: Intact Restrictions Weight Bearing Restrictions Per Provider Order: No     Mobility Bed Mobility               General bed mobility comments: seated in the [facility] to start the session    Transfers Overall transfer level: Needs assistance Equipment used: Rolling walker (2 wheels) Transfers: Sit to/from Stand, Bed to chair/wheelchair/BSC Sit to Stand: Mod assist     Step pivot transfers: Mod assist     General transfer comment: Only one attempt needed of sit to stand from [facility]. Pt able to stand for over 15 seconds while the NT completed peri-care. Without rest the pt then stepped and pivoted to the chair using the RW.      Balance Overall balance assessment: Needs assistance Sitting-balance support: Bilateral upper extremity supported, Feet supported Sitting balance-Leahy Scale: Good Sitting balance - Comments: seated in the chair and recliner   Standing balance support: Reliant on assistive device for balance, During functional activity, Bilateral upper extremity supported Standing balance-Leahy Scale: Poor Standing balance comment: fair/poor using RW                           ADL either performed or assessed with clinical judgement   ADL Overall ADL's : Needs assistance/impaired  Toilet Transfer: Moderate assistance;BSC/3in1 (step pivot) Toilet Transfer Details (indicate cue type and reason): BSC to chair with RW Toileting- Clothing Manipulation and Hygiene: Maximal assistance;Total assistance;Sit to/from stand Toileting - Clothing Manipulation Details (indicate cue type and reason): Assited in standing by this OT while the NT completed peri-care for the pt.                                        Pertinent Vitals/Pain Pain Assessment Pain Assessment: Faces Faces Pain Scale: No hurt                  Communication Communication Communication: No apparent difficulties    Cognition Arousal: Alert Behavior During Therapy: WFL for tasks assessed/performed Cognition: No apparent impairments                               Following commands: Intact       Cueing  General Comments   Cueing Techniques: Verbal cues;Tactile cues      Exercises General Exercises - Upper Extremity Shoulder Flexion: 20 reps, Seated, Both, AROM, Strengthening Shoulder ABduction: AROM, Strengthening, 20 reps, Seated Shoulder Horizontal ABduction: AROM, Strengthening, 20 reps, Both, Seated (2 sets of 10 reps each exercises. Pt noted to fatigue after 8 reps of this exercise and required a short rest before completing the last 2 reps.)   Shoulder Instructions                                                                        OT Goals(Current goals can be found in the care plan section)   Acute Rehab OT Goals Patient Stated Goal: improve function OT Goal Formulation: With patient Time For Goal Achievement: 10/30/23 Potential to Achieve Goals: Good ADL Goals Pt Will Perform Grooming: with modified independence;standing Pt Will Perform Lower Body Bathing: with modified independence;sitting/lateral leans;with adaptive equipment Pt Will Perform Lower Body Dressing: with modified independence;with adaptive equipment;sitting/lateral leans Pt Will Transfer to Toilet: with contact guard assist;stand pivot transfer Pt Will Perform Toileting - Clothing Manipulation and hygiene: with modified independence;sitting/lateral leans;with adaptive equipment Pt/caregiver will Perform Home Exercise Program: Increased strength;Both right and left upper extremity;Independently   OT Frequency:  Min 3X/week                                   End of Session Equipment Utilized During Treatment: Gait belt;Rolling walker (2 wheels) Nurse Communication: Other (comment) (NT observed transfer.)  Activity Tolerance: Patient tolerated  treatment well Patient left: in chair;with call bell/phone within reach  OT Visit Diagnosis: Unsteadiness on feet (R26.81);Other abnormalities of gait and mobility (R26.89);Muscle weakness (generalized) (M62.81)                Time: 8576-8558 OT Time Calculation (min): 18 min Charges:  OT General Charges $OT Visit: 1 Visit OT Treatments $Therapeutic Exercise: 8-22 mins  Dewarren Ledbetter OT, MOT   Jayson Person 10/19/2023, 3:04 PM

## 2023-10-19 NOTE — Progress Notes (Signed)
 Mobility Specialist Progress Note:    10/19/23 0950  Mobility  Activity Pivoted/transferred to/from BSC;Stood at bedside  Level of Assistance Moderate assist, patient does 50-74%  Assistive Device BSC;Front wheel walker  Distance Ambulated (ft) 3 ft  Range of Motion/Exercises Active;All extremities  Activity Response Tolerated well  Mobility Referral Yes  Mobility visit 1 Mobility  Mobility Specialist Start Time (ACUTE ONLY) 0912  Mobility Specialist Stop Time (ACUTE ONLY) 0940  Mobility Specialist Time Calculation (min) (ACUTE ONLY) 28 min   Pt received requesting assistance, required ModA to stand and pivot with RW. Tolerated well, asx throughout. Returned to chair, nursing staff in room. All needs met.   Sherrilee Ditty Mobility Specialist Please contact via Special educational needs teacher or  Rehab office at [phone]

## 2023-10-19 NOTE — Progress Notes (Signed)
 PROGRESS NOTE   Angela [name]  FMW:[id number] DOB: 11/08/1955 DOA: 10/14/2023 PCP: Marvine Rush, MD   Chief Complaint  Patient presents with   Hypotension   Level of care: Med-Surg  Brief Admission History:  Angela [name] is a 68 y.o. female with medical history significant of morbid obesity, chronic venous stasis, atrial fibrillation on anticoagulation, hypertension, hypothyroidism.  Patient seen for chills, hypotension, fatigue since Monday.  She has had decreased oral intake along with 2 episodes of diarrhea over the last 4 hours.  Her symptoms are worsening.  Here, she was noted to have soft blood pressures.  She was briefly hypoxic and was placed on nasal cannula for couple of hours, then her oxygen  levels returned back to normal.  Seen she has noted some redness in the left lower leg that is mildly tender.  Assessment and Plan:  Sepsis - secondary to cellulitis Cellulitis of legs  - improving - changed antibiotics to Unasyn  per pharm D, continue today with plan to change to oral antibiotic tomorrow - continue supportive care, elevate extremities - Follow-up with the blood cultures - NGTD  Lactic acidosis - resolved with treatment  - completed IVF  Chronic Atrial fibrillation - History of A-fib, heart rate controlled  - Not on any rate control medications - Continue apixaban  for full anticoagulation   Acute renal insufficiency Acute on chronic CKD  - completed IVF, avoid hypotension, avoid nephrotoxins  Morbid obesity with chronic venous stasis - torsemide  20 mg daily    Intake/Output Summary (Last 24 hours) at 10/19/2023 1056 Last data filed at 10/18/2023 1933 Gross per 24 hour  Intake 700 ml  Output 1800 ml  Net -1100 ml   Hypothyroidism - Continue home dose levothyroxine   DVT prophylaxis: apixaban  Code Status: DNR Family Communication: t/c to son 8//13  Disposition: SNF placement pending    Consultants:   Procedures:   Antimicrobials:  Unasyn   8/13>>   Subjective: Pt reports her legs remains red and very tender.    Objective: Vitals:   10/18/23 0902 10/18/23 1250 10/18/23 1933 10/19/23 0327  BP: 135/66 (!) 146/95 125/69 (!) 144/89  Pulse: 95 78 81 73  Resp: 20 20    Temp: 98.1 F (36.7 C) 98.2 F (36.8 C) 98.6 F (37 C) 97.7 F (36.5 C)  TempSrc: Oral Oral Oral Oral  SpO2: 95% 95% 97% 95%  Weight:      Height:        Intake/Output Summary (Last 24 hours) at 10/19/2023 1054 Last data filed at 10/18/2023 1933 Gross per 24 hour  Intake 700 ml  Output 1800 ml  Net -1100 ml   Filed Weights   10/14/23 1427 10/14/23 1759  Weight: (!) 190 kg (!) 186.2 kg   Examination:  General exam: Appears calm and comfortable  Respiratory system: Clear to auscultation. Respiratory effort normal. Cardiovascular system: normal S1 & S2 heard. No JVD, murmurs, rubs, gallops or clicks. No pedal edema. Gastrointestinal system: Abdomen is nondistended, soft and nontender. No organomegaly or masses felt. Normal bowel sounds heard. Central nervous system: Alert and oriented. No focal neurological deficits. Extremities: chronic venous stasis edema BLEs. Improved erythema and edema in both legs, resolving cellulitis picture.  Symmetric 5 x 5 power. Skin: chronic venous stasis BLEs, improving cellulitis seen.  Psychiatry: Judgement and insight appear normal. Mood & affect appropriate.   Data Reviewed: I have personally reviewed following labs and imaging studies  CBC: Recent Labs  Lab 10/14/23 1435 10/15/23 0317 10/16/23 0425 10/17/23  9541 10/18/23 0520 10/19/23 0447  WBC 11.8* 10.4 12.9* 12.8* 10.9* 12.0*  NEUTROABS 11.1*  --   --   --   --   --   HGB 11.2* 10.2* 10.7* 10.3* 9.9* 10.4*  HCT 35.4* 32.0* 33.7* 33.8* 31.8* 34.0*  MCV 87.6 86.3 87.1 87.8 86.9 88.3  PLT 111* 116* 123* 158 202 251    Basic Metabolic Panel: Recent Labs  Lab 10/15/23 0317 10/16/23 0425 10/17/23 0458 10/18/23 0520 10/19/23 0447  NA 138 139 139  139 140  K 3.8 4.1 4.4 4.3 4.1  CL 109 111 109 110 107  CO2 18* 21* 23 24 24   GLUCOSE 101* 118* 118* 92 98  BUN 51* 47* 38* 29* 27*  CREATININE 2.84* 2.42* 1.87* 1.46* 1.51*  CALCIUM 8.0* 8.2* 8.3* 8.3* 8.2*  MG  --   --   --   --  2.0    CBG: No results for input(s): GLUCAP in the last 168 hours.  Recent Results (from the past 240 hours)  Blood Culture (routine x 2)     Status: None   Collection Time: 10/14/23  2:27 PM   Specimen: Right Antecubital; Blood  Result Value Ref Range Status   Specimen Description   Final    RIGHT ANTECUBITAL BOTTLES DRAWN AEROBIC AND ANAEROBIC   Special Requests   Final    Blood Culture results may not be optimal due to an inadequate volume of blood received in culture bottles   Culture   Final    NO GROWTH 5 DAYS Performed at [facility], [address], [place], [place] [address]    Report Status 10/19/2023 FINAL  Final  Urine Culture     Status: Abnormal   Collection Time: 10/14/23  2:43 PM   Specimen: Urine, Random  Result Value Ref Range Status   Specimen Description   Final    URINE, RANDOM Performed at [facility], [address] [place], [place] [address]    Special Requests   Final    NONE Reflexed from [id number] Performed at [facility], [address] [place], [place] [address]    Culture (A)  Final    <10,000 COLONIES/mL INSIGNIFICANT GROWTH Performed at [facility], [address] [place], [place] [address]    Report Status 10/15/2023 FINAL  Final  Blood Culture (routine x 2)     Status: None   Collection Time: 10/14/23  3:41 PM   Specimen: BLOOD LEFT HAND  Result Value Ref Range Status   Specimen Description BLOOD LEFT HAND AEROBIC BOTTLE ONLY  Final   Special Requests   Final    Blood Culture results may not be optimal due to an inadequate volume of blood received in culture bottles   Culture   Final    NO GROWTH 5 DAYS Performed at [facility], [address], [place], [place] [address]    Report Status  10/19/2023 FINAL  Final  MRSA Next Gen by PCR, Nasal     Status: None   Collection Time: 10/14/23  5:50 PM   Specimen: Nasal Mucosa; Nasal Swab  Result Value Ref Range Status   MRSA by PCR Next Gen NOT DETECTED NOT DETECTED Final    Comment: (NOTE) The GeneXpert MRSA Assay (FDA approved for NASAL specimens only), is one component of a comprehensive MRSA colonization surveillance program. It is not intended to diagnose MRSA infection nor to guide or monitor treatment for MRSA infections. Test performance is not FDA approved  in patients less than 19 years old. Performed at [facility], [address] [place], [place] [address]      Radiology Studies: No results found.  Scheduled Meds:  apixaban   5 mg Oral BID   Chlorhexidine  Gluconate Cloth  6 each Topical Daily   levothyroxine   150 mcg Oral Daily   lidocaine   1 patch Transdermal Q24H   simvastatin   20 mg Oral QHS   torsemide   20 mg Oral Daily   zolpidem   5 mg Oral QHS    Continuous Infusions:  ampicillin -sulbactam (UNASYN ) IV 3 g (10/19/23 0957)    LOS: 5 days   Time spent: 55 mins  Fredi Hurtado Vicci, MD How to contact the [facility] Attending or Consulting provider 7A - 7P or covering provider during after hours 7P -7A, for this patient?  Check the care team in [facility] and look for a) attending/consulting [facility] provider listed and b) the [facility] team listed Log into [email or web address] to find provider on call.  Locate the [facility] provider you are looking for under [facility] and page to a number that you can be directly reached. If you still have difficulty reaching the provider, please page the [facility] (Director on Call) for the Hospitalists listed on amion for assistance.  10/19/2023, 10:54 AM

## 2023-10-20 DIAGNOSIS — R262 Difficulty in walking, not elsewhere classified: Secondary | ICD-10-CM | POA: Diagnosis not present

## 2023-10-20 DIAGNOSIS — E039 Hypothyroidism, unspecified: Secondary | ICD-10-CM | POA: Diagnosis not present

## 2023-10-20 DIAGNOSIS — J9601 Acute respiratory failure with hypoxia: Secondary | ICD-10-CM | POA: Diagnosis not present

## 2023-10-20 DIAGNOSIS — L03115 Cellulitis of right lower limb: Secondary | ICD-10-CM | POA: Diagnosis not present

## 2023-10-20 DIAGNOSIS — L98491 Non-pressure chronic ulcer of skin of other sites limited to breakdown of skin: Secondary | ICD-10-CM | POA: Diagnosis not present

## 2023-10-20 DIAGNOSIS — M6281 Muscle weakness (generalized): Secondary | ICD-10-CM | POA: Diagnosis not present

## 2023-10-20 DIAGNOSIS — I482 Chronic atrial fibrillation, unspecified: Secondary | ICD-10-CM | POA: Diagnosis not present

## 2023-10-20 DIAGNOSIS — R531 Weakness: Secondary | ICD-10-CM | POA: Diagnosis not present

## 2023-10-20 DIAGNOSIS — E785 Hyperlipidemia, unspecified: Secondary | ICD-10-CM | POA: Diagnosis not present

## 2023-10-20 DIAGNOSIS — N182 Chronic kidney disease, stage 2 (mild): Secondary | ICD-10-CM | POA: Diagnosis not present

## 2023-10-20 DIAGNOSIS — Z7409 Other reduced mobility: Secondary | ICD-10-CM | POA: Diagnosis not present

## 2023-10-20 DIAGNOSIS — G4700 Insomnia, unspecified: Secondary | ICD-10-CM | POA: Diagnosis not present

## 2023-10-20 DIAGNOSIS — K5790 Diverticulosis of intestine, part unspecified, without perforation or abscess without bleeding: Secondary | ICD-10-CM | POA: Diagnosis not present

## 2023-10-20 DIAGNOSIS — G4730 Sleep apnea, unspecified: Secondary | ICD-10-CM | POA: Diagnosis not present

## 2023-10-20 DIAGNOSIS — I700 Atherosclerosis of aorta: Secondary | ICD-10-CM | POA: Diagnosis not present

## 2023-10-20 DIAGNOSIS — M160 Bilateral primary osteoarthritis of hip: Secondary | ICD-10-CM | POA: Diagnosis not present

## 2023-10-20 DIAGNOSIS — Z872 Personal history of diseases of the skin and subcutaneous tissue: Secondary | ICD-10-CM | POA: Diagnosis not present

## 2023-10-20 DIAGNOSIS — A419 Sepsis, unspecified organism: Secondary | ICD-10-CM | POA: Diagnosis not present

## 2023-10-20 DIAGNOSIS — E782 Mixed hyperlipidemia: Secondary | ICD-10-CM | POA: Diagnosis not present

## 2023-10-20 DIAGNOSIS — L03116 Cellulitis of left lower limb: Secondary | ICD-10-CM | POA: Diagnosis not present

## 2023-10-20 DIAGNOSIS — Z7901 Long term (current) use of anticoagulants: Secondary | ICD-10-CM | POA: Diagnosis not present

## 2023-10-20 DIAGNOSIS — I872 Venous insufficiency (chronic) (peripheral): Secondary | ICD-10-CM | POA: Diagnosis not present

## 2023-10-20 DIAGNOSIS — E8809 Other disorders of plasma-protein metabolism, not elsewhere classified: Secondary | ICD-10-CM | POA: Diagnosis not present

## 2023-10-20 DIAGNOSIS — I10 Essential (primary) hypertension: Secondary | ICD-10-CM | POA: Diagnosis not present

## 2023-10-20 DIAGNOSIS — B372 Candidiasis of skin and nail: Secondary | ICD-10-CM | POA: Diagnosis not present

## 2023-10-20 DIAGNOSIS — I1310 Hypertensive heart and chronic kidney disease without heart failure, with stage 1 through stage 4 chronic kidney disease, or unspecified chronic kidney disease: Secondary | ICD-10-CM | POA: Diagnosis not present

## 2023-10-20 DIAGNOSIS — T148XXA Other injury of unspecified body region, initial encounter: Secondary | ICD-10-CM | POA: Diagnosis not present

## 2023-10-20 DIAGNOSIS — Z743 Need for continuous supervision: Secondary | ICD-10-CM | POA: Diagnosis not present

## 2023-10-20 DIAGNOSIS — I4819 Other persistent atrial fibrillation: Secondary | ICD-10-CM | POA: Diagnosis not present

## 2023-10-20 LAB — BASIC METABOLIC PANEL WITH GFR
Anion gap: 9 (ref 5–15)
BUN: 24 mg/dL — ABNORMAL HIGH (ref 8–23)
CO2: 28 mmol/L (ref 22–32)
Calcium: 8.2 mg/dL — ABNORMAL LOW (ref 8.9–10.3)
Chloride: 103 mmol/L (ref 98–111)
Creatinine, Ser: 1.44 mg/dL — ABNORMAL HIGH (ref 0.44–1.00)
GFR, Estimated: 40 mL/min — ABNORMAL LOW (ref >60)
Glucose, Bld: 93 mg/dL (ref 70–99)
Potassium: 4 mmol/L (ref 3.5–5.1)
Sodium: 140 mmol/L (ref 135–145)

## 2023-10-20 LAB — CBC
HCT: 34.5 % — ABNORMAL LOW (ref 36.0–46.0)
Hemoglobin: 10.6 g/dL — ABNORMAL LOW (ref 12.0–15.0)
MCH: 26.7 pg (ref 26.0–34.0)
MCHC: 30.7 g/dL (ref 30.0–36.0)
MCV: 86.9 fL (ref 80.0–100.0)
Platelets: 310 K/uL (ref 150–400)
RBC: 3.97 MIL/uL (ref 3.87–5.11)
RDW: 16.1 % — ABNORMAL HIGH (ref 11.5–15.5)
WBC: 13.2 K/uL — ABNORMAL HIGH (ref 4.0–10.5)
nRBC: 0 % (ref 0.0–0.2)

## 2023-10-20 MED ORDER — OXYCODONE HCL 5 MG PO TABS: 5.0000 mg | ORAL_TABLET | Freq: Four times a day (QID) | ORAL | 0 refills | Status: DC | PRN

## 2023-10-20 MED ORDER — DOXYCYCLINE HYCLATE 100 MG PO TABS: 100.0000 mg | ORAL_TABLET | Freq: Two times a day (BID) | ORAL | Status: AC

## 2023-10-20 MED ORDER — ZOLPIDEM TARTRATE 5 MG PO TABS: 5.0000 mg | ORAL_TABLET | Freq: Every evening | ORAL | 0 refills | Status: DC | PRN

## 2023-10-20 MED ORDER — METHOCARBAMOL 500 MG PO TABS: 500.0000 mg | ORAL_TABLET | Freq: Three times a day (TID) | ORAL | Status: DC | PRN

## 2023-10-20 MED ORDER — DOXYCYCLINE HYCLATE 100 MG PO TABS
100.0000 mg | ORAL_TABLET | Freq: Two times a day (BID) | ORAL | Status: DC
  Administered 2023-10-20: 100 mg via ORAL
  Filled 2023-10-20: qty 1

## 2023-10-20 MED ORDER — POLYETHYLENE GLYCOL 3350 17 G PO PACK: 17.0000 g | PACK | Freq: Every day | ORAL | Status: DC | PRN

## 2023-10-20 MED ORDER — SACCHAROMYCES BOULARDII 250 MG PO CAPS: 250.0000 mg | ORAL_CAPSULE | Freq: Two times a day (BID) | ORAL | Status: AC

## 2023-10-20 MED ORDER — TORSEMIDE 10 MG PO TABS: 10.0000 mg | ORAL_TABLET | Freq: Every day | ORAL | Status: DC

## 2023-10-20 MED ORDER — LIDOCAINE 5 % EX PTCH: 1.0000 | MEDICATED_PATCH | Freq: Every day | CUTANEOUS | Status: DC | PRN

## 2023-10-20 NOTE — Plan of Care (Signed)

## 2023-10-20 NOTE — Discharge Instructions (Signed)
 IMPORTANT INFORMATION: PAY CLOSE ATTENTION  ? ?PHYSICIAN DISCHARGE INSTRUCTIONS ? ?Follow with Primary care provider  Angela Found, MD  and other consultants as instructed by your Hospitalist Physician ? ?SEEK MEDICAL CARE OR RETURN TO EMERGENCY ROOM IF SYMPTOMS COME BACK, WORSEN OR NEW PROBLEM DEVELOPS  ? ?Please note: ?You were cared for by a hospitalist during your hospital stay. Every effort will be made to forward records to your primary care provider.  You can request that your primary care provider send for your hospital records if they have not received them.  Once you are discharged, your primary care physician will handle any further medical issues. Please note that NO REFILLS for any discharge medications will be authorized once you are discharged, as it is imperative that you return to your primary care physician (or establish a relationship with a primary care physician if you do not have one) for your post hospital discharge needs so that they can reassess your need for medications and monitor your lab values. ? ?Please get a complete blood count and chemistry panel checked by your Primary MD at your next visit, and again as instructed by your Primary MD. ? ?Get Medicines reviewed and adjusted: ?Please take all your medications with you for your next visit with your Primary MD ? ?Laboratory/radiological data: ?Please request your Primary MD to go over all hospital tests and procedure/radiological results at the follow up, please ask your primary care provider to get all Hospital records sent to his/her office. ? ?In some cases, they will be blood work, cultures and biopsy results pending at the time of your discharge. Please request that your primary care provider follow up on these results. ? ?If you are diabetic, please bring your blood sugar readings with you to your follow up appointment with primary care.   ? ?Please call and make your follow up appointments as soon as possible.   ? ?Also Note  the following: ?If you experience worsening of your admission symptoms, develop shortness of breath, life threatening emergency, suicidal or homicidal thoughts you must seek medical attention immediately by calling 911 or calling your MD immediately  if symptoms less severe. ? ?You must read complete instructions/literature along with all the possible adverse reactions/side effects for all the Medicines you take and that have been prescribed to you. Take any new Medicines after you have completely understood and accpet all the possible adverse reactions/side effects.  ? ?Do not drive when taking Pain medications or sleeping medications (Benzodiazepines) ? ?Do not take more than prescribed Pain, Sleep and Anxiety Medications. It is not advisable to combine anxiety,sleep and pain medications without talking with your primary care practitioner ? ?Special Instructions: If you have smoked or chewed Tobacco  in the last 2 yrs please stop smoking, stop any regular Alcohol  and or any Recreational drug use. ? ?Wear Seat belts while driving.  Do not drive if taking any narcotic, mind altering or controlled substances or recreational drugs or alcohol.  ? ? ? ? ? ?

## 2023-10-20 NOTE — Progress Notes (Signed)
 Physical Therapy Treatment Patient Details Name: Angela [name] MRN: [id number] DOB: [birth year] Today's Date: 10/20/2023   History of Present Illness Angela [name] is a 68 y.o. female with medical history significant of morbid obesity, chronic venous stasis, atrial fibrillation on anticoagulation, hypertension, hypothyroidism.  Patient seen for chills, hypotension, fatigue since Monday.  She has had decreased oral intake along with 2 episodes of diarrhea over the last 4 hours.  Her symptoms are worsening.  Here, she was noted to have soft blood pressures.  She was briefly hypoxic and was placed on nasal cannula for couple of hours, then her oxygen  levels returned back to normal.  Seen she has noted some redness in the left lower leg that is mildly tender.    PT Comments  Patient pleasant and agreeable to PT session today. Patient continues to be limited by fatigue with most functional tasks but demonstrates increased activity tolerance and independence. Patient required minimal physical assist for bed mobility and is able to self complete most of task with verbal cues for proper hand and LE placement. She required modA to stand on first attempt from EOB and was able to tolerate prolonged standing to increase WB tolerance but limited mostly by BIL leg pain and fatigue. Patient continues to be limited with functional ambulation at this time. Patient will benefit from continued skilled physical therapy in hospital and recommended venue below to increase strength, balance, endurance for safe ADLs and gait.     If plan is discharge home, recommend the following: A lot of help with bathing/dressing/bathroom;A lot of help with walking and/or transfers;Help with stairs or ramp for entrance;Assist for transportation   Can travel by private vehicle     No  Equipment Recommendations  None recommended by PT    Recommendations for Other Services       Precautions / Restrictions  Precautions Precautions: Fall Recall of Precautions/Restrictions: Intact Restrictions Weight Bearing Restrictions Per Provider Order: No     Mobility  Bed Mobility Overal bed mobility: Needs Assistance Bed Mobility: Supine to Sit     Supine to sit: HOB elevated, Used rails, Min assist     General bed mobility comments: pt able to self complete most of task with increased time    Transfers Overall transfer level: Needs assistance Equipment used: Rolling walker (2 wheels) Transfers: Sit to/from Stand, Bed to chair/wheelchair/BSC Sit to Stand: Mod assist   Step pivot transfers: Mod assist       General transfer comment: One attempt to stand from EOB using RW using tactile cues for full knee ext, able to tolerate prologned standing for 45 seconds    Ambulation/Gait Ambulation/Gait assistance: Mod assist Gait Distance (Feet): 4 Feet Assistive device: Rolling walker (2 wheels) Gait Pattern/deviations: Trunk flexed, Decreased step length - right, Decreased step length - left, Decreased stride length, Antalgic Gait velocity: slow     General Gait Details: labored, limited to side stepping at bedside due to leg pain   Stairs             Wheelchair Mobility     Tilt Bed    Modified Rankin (Stroke Patients Only)       Balance Overall balance assessment: Needs assistance Sitting-balance support: Bilateral upper extremity supported, Feet supported Sitting balance-Leahy Scale: Good Sitting balance - Comments: seated EOB   Standing balance support: Reliant on assistive device for balance, During functional activity, Bilateral upper extremity supported Standing balance-Leahy Scale: Fair Standing balance comment: fair using RW  Communication Communication Communication: No apparent difficulties  Cognition Arousal: Alert Behavior During Therapy: WFL for tasks assessed/performed   PT - Cognitive impairments: No apparent  impairments                         Following commands: Intact      Cueing Cueing Techniques: Verbal cues, Tactile cues  Exercises General Exercises - Lower Extremity Long Arc Quad: Seated, AROM, Strengthening, Both, 10 reps Hip Flexion/Marching: Seated, AROM, Strengthening, Both, 10 reps Toe Raises: Seated, AROM, Strengthening, Both, 10 reps Heel Raises: Seated, AROM, Strengthening, Both, 10 reps Mini-Sqauts: 10 reps, Both, Standing, Strengthening    General Comments        Pertinent Vitals/Pain Pain Assessment Faces Pain Scale: Hurts a little bit Pain Location: legs Pain Descriptors / Indicators: Sore, Discomfort Pain Intervention(s): Monitored during session    Home Living Family/patient expects to be discharged to:: Private residence Living Arrangements: Other relatives                      Prior Function            PT Goals (current goals can now be found in the care plan section) Acute Rehab PT Goals Patient Stated Goal: return home PT Goal Formulation: With patient Time For Goal Achievement: 10/30/23 Potential to Achieve Goals: Good Progress towards PT goals: Progressing toward goals    Frequency    Min 3X/week      PT Plan      Co-evaluation              AM-PAC PT 6 Clicks Mobility   Outcome Measure  Help needed turning from your back to your side while in a flat bed without using bedrails?: A Lot Help needed moving from lying on your back to sitting on the side of a flat bed without using bedrails?: A Lot Help needed moving to and from a bed to a chair (including a wheelchair)?: A Lot Help needed standing up from a chair using your arms (e.g., wheelchair or bedside chair)?: A Lot Help needed to walk in hospital room?: A Lot Help needed climbing 3-5 steps with a railing? : Total 6 Click Score: 11    End of Session   Activity Tolerance: Patient tolerated treatment well;Patient limited by fatigue Patient left: with  call bell/phone within reach;in chair Nurse Communication: Mobility status PT Visit Diagnosis: Muscle weakness (generalized) (M62.81);Other abnormalities of gait and mobility (R26.89);Unsteadiness on feet (R26.81)     Time: 8978-8958 PT Time Calculation (min) (ACUTE ONLY): 20 min  Charges:    $Therapeutic Exercise: 8-22 mins $Therapeutic Activity: 8-22 mins PT General Charges $$ ACUTE PT VISIT: 1 Visit                     11:57 AM, 10/20/23,  Onnie Como, SPT

## 2023-10-20 NOTE — Discharge Summary (Signed)
 Physician Discharge Summary  Angela [name] FMW:[id number] DOB: 1956/01/02 DOA: 10/14/2023  PCP: Marvine Rush, MD  Admit date: 10/14/2023 Discharge date: 10/20/2023  Admitted From:  HOME  Disposition:  SNF   Recommendations for Outpatient Follow-up:  Follow up with PCP in 1-2 weeks Please obtain CBC/BMP in 1 week to follow up renal function  Home Health: SNF   Discharge Condition: STABLE   CODE STATUS: FULL DIET: Heart healthy/low sodium    Brief Hospitalization Summary: Please see all hospital notes, images, labs for full details of the hospitalization. Admission provider HPI: Angela [name] is a 68 y.o. female with medical history significant of morbid obesity, chronic venous stasis, atrial fibrillation on anticoagulation, hypertension, hypothyroidism.  Patient seen for chills, hypotension, fatigue since Monday.  She has had decreased oral intake along with 2 episodes of diarrhea over the last 4 hours.  Her symptoms are worsening.  Here, she was noted to have soft blood pressures.  She was briefly hypoxic and was placed on nasal cannula for couple of hours, then her oxygen  levels returned back to normal.  Seen she has noted some redness in the left lower leg that is mildly tender.  Hospital course by listed problems addressed   Sepsis - secondary to cellulitis -- sepsis physiology resolved now Cellulitis of legs  - improving - changed antibiotics to Unasyn  per pharm D, transitioned to oral doxycycline  100 mg BID to complete course  - continue supportive care, elevate extremities - Follow-up with the blood cultures - NGTD   Lactic acidosis - resolved with treatment  - completed IVF   Chronic Atrial fibrillation - History of A-fib, heart rate controlled  - Not on any rate control medications and HR has been well controlled  - Continue apixaban  for full anticoagulation    Acute renal insufficiency Acute on chronic CKD stage 2 - completed IVF, avoid hypotension, avoid  nephrotoxins   Morbid obesity with chronic venous stasis - torsemide  10 mg daily    Essential hypertension - held BP meds as BPs have been well controlled with fluid management on torsemide  daily   Intake/Output Summary (Last 24 hours) at 10/20/2023 1114 Last data filed at 10/20/2023 1023 Gross per 24 hour  Intake 360 ml  Output 2500 ml  Net -2140 ml   Hypothyroidism - Continue home dose levothyroxine   Discharge Diagnoses:  Principal Problem:   Sepsis (HCC) Active Problems:   Hyperlipidemia   Organic sleep apnea   Hypothyroid   Atrial fibrillation (HCC)   Morbid obesity (HCC)   Acute respiratory failure with hypoxia [facility])   Discharge Instructions:  Allergies as of 10/20/2023       Reactions   Ibuprofen Other (See Comments)   Physician states it will affect her kidneys        Medication List     STOP taking these medications    celecoxib 200 MG capsule Commonly known as: CELEBREX   furosemide  40 MG tablet Commonly known as: LASIX    lisinopril-hydrochlorothiazide 10-12.5 MG tablet Commonly known as: ZESTORETIC       TAKE these medications    acetaminophen  500 MG tablet Commonly known as: TYLENOL  Take 1,000 mg by mouth every 6 (six) hours as needed for headache.   cholecalciferol  25 MCG (1000 UNIT) tablet Commonly known as: VITAMIN D3 Take 1,000 Units by mouth daily.   doxycycline  100 MG tablet Commonly known as: VIBRA -TABS Take 1 tablet (100 mg total) by mouth every 12 (twelve) hours for 5 days.   Eliquis   5 MG Tabs tablet Generic drug: apixaban  TAKE 1 TABLET BY MOUTH TWICE  DAILY   FLAX SEED OIL PO Take 1 capsule by mouth daily.   levothyroxine  150 MCG tablet Commonly known as: SYNTHROID  Take 150 mcg by mouth daily.   lidocaine  5 % Commonly known as: LIDODERM  Place 1 patch onto the skin daily as needed (neck/muscle pain). Remove & Discard patch within 12 hours or as directed by MD   methocarbamol  500 MG tablet Commonly known as:  ROBAXIN  Take 1 tablet (500 mg total) by mouth every 8 (eight) hours as needed for muscle spasms (neck pain and spasm).   nystatin  powder Generic drug: nystatin  Apply 1 Application topically 3 (three) times daily as needed (moisture associated skin irritation/rash). Under breasts   oxyCODONE  5 MG immediate release tablet Commonly known as: Oxy IR/ROXICODONE  Take 1 tablet (5 mg total) by mouth every 6 (six) hours as needed for moderate pain (pain score 4-6).   polyethylene glycol 17 g packet Commonly known as: MIRALAX  / GLYCOLAX  Take 17 g by mouth daily as needed for mild constipation.   saccharomyces boulardii 250 MG capsule Commonly known as: FLORASTOR Take 1 capsule (250 mg total) by mouth 2 (two) times daily.   simvastatin  20 MG tablet Commonly known as: ZOCOR  Take 20 mg by mouth at bedtime.   torsemide  10 MG tablet Commonly known as: DEMADEX  Take 1 tablet (10 mg total) by mouth daily. Start taking on: October 21, 2023   vitamin C 100 MG tablet Take 100 mg by mouth daily.   zolpidem  5 MG tablet Commonly known as: AMBIEN  Take 1 tablet (5 mg total) by mouth at bedtime as needed for sleep. What changed:  medication strength how much to take when to take this reasons to take this        Contact information for follow-up providers     Marvine Rush, MD Follow up in 2 week(s).   Specialty: Family Medicine Why: Hospital Follow Up Contact information: [address] [place] [address] [phone]              Contact information for after-discharge care     Destination     [employer] .   Service: Skilled Nursing Contact information: [address] [place]  [address] [phone]                    Allergies  Allergen Reactions   Ibuprofen Other (See Comments)    Physician states it will affect her kidneys   Allergies as of 10/20/2023       Reactions   Ibuprofen Other (See Comments)   Physician states  it will affect her kidneys        Medication List     STOP taking these medications    celecoxib 200 MG capsule Commonly known as: CELEBREX   furosemide  40 MG tablet Commonly known as: LASIX    lisinopril-hydrochlorothiazide 10-12.5 MG tablet Commonly known as: ZESTORETIC       TAKE these medications    acetaminophen  500 MG tablet Commonly known as: TYLENOL  Take 1,000 mg by mouth every 6 (six) hours as needed for headache.   cholecalciferol  25 MCG (1000 UNIT) tablet Commonly known as: VITAMIN D3 Take 1,000 Units by mouth daily.   doxycycline  100 MG tablet Commonly known as: VIBRA -TABS Take 1 tablet (100 mg total) by mouth every 12 (twelve) hours for 5 days.   Eliquis  5 MG Tabs tablet Generic drug: apixaban  TAKE 1 TABLET BY  MOUTH TWICE  DAILY   FLAX SEED OIL PO Take 1 capsule by mouth daily.   levothyroxine  150 MCG tablet Commonly known as: SYNTHROID  Take 150 mcg by mouth daily.   lidocaine  5 % Commonly known as: LIDODERM  Place 1 patch onto the skin daily as needed (neck/muscle pain). Remove & Discard patch within 12 hours or as directed by MD   methocarbamol  500 MG tablet Commonly known as: ROBAXIN  Take 1 tablet (500 mg total) by mouth every 8 (eight) hours as needed for muscle spasms (neck pain and spasm).   nystatin  powder Generic drug: nystatin  Apply 1 Application topically 3 (three) times daily as needed (moisture associated skin irritation/rash). Under breasts   oxyCODONE  5 MG immediate release tablet Commonly known as: Oxy IR/ROXICODONE  Take 1 tablet (5 mg total) by mouth every 6 (six) hours as needed for moderate pain (pain score 4-6).   polyethylene glycol 17 g packet Commonly known as: MIRALAX  / GLYCOLAX  Take 17 g by mouth daily as needed for mild constipation.   saccharomyces boulardii 250 MG capsule Commonly known as: FLORASTOR Take 1 capsule (250 mg total) by mouth 2 (two) times daily.   simvastatin  20 MG tablet Commonly known as:  ZOCOR  Take 20 mg by mouth at bedtime.   torsemide  10 MG tablet Commonly known as: DEMADEX  Take 1 tablet (10 mg total) by mouth daily. Start taking on: October 21, 2023   vitamin C 100 MG tablet Take 100 mg by mouth daily.   zolpidem  5 MG tablet Commonly known as: AMBIEN  Take 1 tablet (5 mg total) by mouth at bedtime as needed for sleep. What changed:  medication strength how much to take when to take this reasons to take this        Procedures/Studies: CT CHEST ABDOMEN PELVIS WO CONTRAST Result Date: 10/14/2023 CLINICAL DATA:  Sepsis EXAM: CT CHEST, ABDOMEN AND PELVIS WITHOUT CONTRAST TECHNIQUE: Multidetector CT imaging of the chest, abdomen and pelvis was performed following the standard protocol without IV contrast. RADIATION DOSE REDUCTION: This exam was performed according to the departmental dose-optimization program which includes automated exposure control, adjustment of the mA and/or kV according to patient size and/or use of iterative reconstruction technique. COMPARISON:  None available. FINDINGS: Of note, the lack of intravenous contrast limits evaluation of the solid organ parenchyma and vascularity. CT CHEST FINDINGS Cardiovascular: Mild cardiomegaly. No pericardial effusion.No aortic aneurysm. Scattered coronary atherosclerosis. Mediastinum/Nodes: No mediastinal mass. No mediastinal, hilar, or axillary lymphadenopathy. Lungs/Pleura: The midline trachea and bronchi are patent. No focal airspace consolidation, pleural effusion, or pneumothorax. Subsegmental atelectasis in the lingula. Posterior bibasilar dependent atelectasis. CT ABDOMEN PELVIS FINDINGS Hepatobiliary: No mass.Punctate radiopaque gallstones. No wall thickening. No intrahepatic or extrahepatic biliary ductal dilation. Pancreas: Diffuse fatty atrophy of the pancreatic parenchyma. No mass or ductal dilation. No peripancreatic inflammation or fluid collection. Spleen: Normal size. No mass. Adrenals/Urinary Tract: No  adrenal masses. No renal mass. 6 x 10.2 cm intermediate density lesion along the lower pole of the right kidney (axial 80). No hydronephrosis or nephrolithiasis. The urinary bladder is completely decompressed. Stomach/Bowel:Small hiatal hernia. The stomach is decompressed without focal abnormality. No small bowel wall thickening or inflammation. No small bowel obstruction.Normal appendix. Scattered colonic diverticulosis. Vascular/Lymphatic: No aortic aneurysm. Scattered aortoiliac atherosclerosis. Mildly enlarged left external iliac chain lymph node measuring 2.4 cm. There is a right external iliac chain lymph node measuring 2.2 cm (axial 110). Multiple enlarged bilateral inguinal lymph nodes measuring up to 1.9 cm on the right and 1.1 cm  on the left. Reproductive: Age-related atrophy of the uterus and ovaries. No concerning adnexal mass. No free pelvic fluid. Other: No pneumoperitoneum, ascites, or mesenteric inflammation. Musculoskeletal: No acute fracture or destructive lesion. Moderate subcutaneous edema along the lower abdominal pannus. Skin thickening on the right lower pannus (axial 133). Moderate left and severe right hip osteoarthritis. Multilevel degenerative disc disease of the spine. Thoracic DISH. IMPRESSION: 1. No acute intrathoracic abnormality. Specifically, no pneumonia, pulmonary edema, or pleural effusion. 2. Moderate subcutaneous edema along the lower abdominal pannus with overlying skin thickening on the right (axial 133). This could reflect changes of cellulitis and correlation with physical exam findings requested. 3. Mildly enlarged bilateral inguinal and external iliac chain lymph nodes measuring up to 2.4 cm on the right, likely reactive. 4. Along the lower pole of the right kidney, there is a 6 x 10.2 cm intermediate density lesion, possibly an exophytic proteinaceous or hemorrhagic cyst. Alternative differential considerations include a chronic hematoma or cystic retroperitoneal  lymphangioma. Comparison with outside imaging is recommended to document stability. If none is available, a nonemergent multiphase abdominal MRI with IV contrast could be considered. 5. Scattered colonic diverticulosis. No changes of acute diverticulitis. Small hiatal hernia. 6. Punctate cholecystolithiasis. Aortic Atherosclerosis (ICD10-I70.0). Electronically Signed   By: Rogelia Myers M.D.   On: 10/14/2023 16:36   DG Chest Port 1 View Result Date: 10/14/2023 CLINICAL DATA:  Possible sepsis EXAM: PORTABLE CHEST 1 VIEW COMPARISON:  02/10/2020 FINDINGS: Mildly degraded exam due to AP portable technique and patient body habitus. Numerous leads and wires project over the chest. Patient rotated right. Chin overlies the apices. Moderate cardiomegaly. No congestive failure. Mild left hemidiaphragm elevation. Probable atelectasis or scar at the right lung base. IMPRESSION: 1. Cardiomegaly without congestive failure. 2. Probable right base scarring or atelectasis. Electronically Signed   By: Rockey Kilts M.D.   On: 10/14/2023 15:29     Subjective: Pt reports her legs are much less edematous since starting torsemide .  Less redness, pain and discomfort in legs.    Discharge Exam: Vitals:   10/19/23 1952 10/20/23 0508  BP: 127/62 133/77  Pulse: 86 79  Resp: 18 18  Temp: 98 F (36.7 C) 97.7 F (36.5 C)  SpO2: 94% 92%   Vitals:   10/19/23 0327 10/19/23 1355 10/19/23 1952 10/20/23 0508  BP: (!) 144/89 123/71 127/62 133/77  Pulse: 73 68 86 79  Resp:   18 18  Temp: 97.7 F (36.5 C) 98 F (36.7 C) 98 F (36.7 C) 97.7 F (36.5 C)  TempSrc: Oral Oral Oral Oral  SpO2: 95% 99% 94% 92%  Weight:    (!) 193.2 kg  Height:       General exam: Appears calm and comfortable  Respiratory system: Clear to auscultation. Respiratory effort normal. Cardiovascular system: normal S1 & S2 heard. No JVD, murmurs, rubs, gallops or clicks. 1+ pedal edema. Gastrointestinal system: Abdomen is nondistended, soft and  nontender. No organomegaly or masses felt. Normal bowel sounds heard. Central nervous system: Alert and oriented. No focal neurological deficits. Extremities: chronic venous stasis edema BLEs. Improved erythema and edema in both legs, resolving cellulitis picture.  Symmetric 5 x 5 power. Skin: chronic venous stasis BLEs, improving cellulitis seen.  Psychiatry: Judgement and insight appear normal. Mood & affect appropriate.   The results of significant diagnostics from this hospitalization (including imaging, microbiology, ancillary and laboratory) are listed below for reference.     Microbiology: Recent Results (from the past 240 hours)  Blood Culture (routine  x 2)     Status: None   Collection Time: 10/14/23  2:27 PM   Specimen: Right Antecubital; Blood  Result Value Ref Range Status   Specimen Description   Final    RIGHT ANTECUBITAL BOTTLES DRAWN AEROBIC AND ANAEROBIC   Special Requests   Final    Blood Culture results may not be optimal due to an inadequate volume of blood received in culture bottles   Culture   Final    NO GROWTH 5 DAYS Performed at [facility], [address], [place], [place] [address]    Report Status 10/19/2023 FINAL  Final  Urine Culture     Status: Abnormal   Collection Time: 10/14/23  2:43 PM   Specimen: Urine, Random  Result Value Ref Range Status   Specimen Description   Final    URINE, RANDOM Performed at [facility], [address], [place], [place] [address]    Special Requests   Final    NONE Reflexed from [id number] Performed at [facility], [address], [place], [place] [address]    Culture (A)  Final    <10,000 COLONIES/mL INSIGNIFICANT GROWTH Performed at [facility], [address] [place], [place] [address]    Report Status 10/15/2023 FINAL  Final  Blood Culture (routine x 2)     Status: None   Collection Time: 10/14/23  3:41 PM   Specimen: BLOOD LEFT HAND  Result Value Ref Range Status   Specimen Description BLOOD LEFT  HAND AEROBIC BOTTLE ONLY  Final   Special Requests   Final    Blood Culture results may not be optimal due to an inadequate volume of blood received in culture bottles   Culture   Final    NO GROWTH 5 DAYS Performed at [facility], [address], [place], [place] [address]    Report Status 10/19/2023 FINAL  Final  MRSA Next Gen by PCR, Nasal     Status: None   Collection Time: 10/14/23  5:50 PM   Specimen: Nasal Mucosa; Nasal Swab  Result Value Ref Range Status   MRSA by PCR Next Gen NOT DETECTED NOT DETECTED Final    Comment: (NOTE) The GeneXpert MRSA Assay (FDA approved for NASAL specimens only), is one component of a comprehensive MRSA colonization surveillance program. It is not intended to diagnose MRSA infection nor to guide or monitor treatment for MRSA infections. Test performance is not FDA approved in patients less than 52 years old. Performed at [facility], [address] [place], [place] [address]      Labs: BNP (last 3 results) Recent Labs    10/14/23 1435  BNP 202.0*   Basic Metabolic Panel: Recent Labs  Lab 10/16/23 0425 10/17/23 0458 10/18/23 0520 10/19/23 0447 10/20/23 0458  NA 139 139 139 140 140  K 4.1 4.4 4.3 4.1 4.0  CL 111 109 110 107 103  CO2 21* 23 24 24 28   GLUCOSE 118* 118* 92 98 93  BUN 47* 38* 29* 27* 24*  CREATININE 2.42* 1.87* 1.46* 1.51* 1.44*  CALCIUM 8.2* 8.3* 8.3* 8.2* 8.2*  MG  --   --   --  2.0  --    Liver Function Tests: Recent Labs  Lab 10/14/23 1435  AST 24  ALT 16  ALKPHOS 105  BILITOT 1.2  PROT 5.8*  ALBUMIN 2.6*   No results for input(s): LIPASE, AMYLASE in the last 168 hours. No results for input(s): AMMONIA in the last 168 hours. CBC:  Recent Labs  Lab 10/14/23 1435 10/15/23 0317 10/16/23 0425 10/17/23 0458 10/18/23 0520 10/19/23 0447 10/20/23 0458  WBC 11.8*   < > 12.9* 12.8* 10.9* 12.0* 13.2*  NEUTROABS 11.1*  --   --   --   --   --   --   HGB 11.2*   < > 10.7* 10.3* 9.9* 10.4* 10.6*  HCT  35.4*   < > 33.7* 33.8* 31.8* 34.0* 34.5*  MCV 87.6   < > 87.1 87.8 86.9 88.3 86.9  PLT 111*   < > 123* 158 202 251 310   < > = values in this interval not displayed.   Cardiac Enzymes: No results for input(s): CKTOTAL, CKMB, CKMBINDEX, TROPONINI in the last 168 hours. BNP: Invalid input(s): POCBNP CBG: No results for input(s): GLUCAP in the last 168 hours. D-Dimer No results for input(s): DDIMER in the last 72 hours. Hgb A1c No results for input(s): HGBA1C in the last 72 hours. Lipid Profile No results for input(s): CHOL, HDL, LDLCALC, TRIG, CHOLHDL, LDLDIRECT in the last 72 hours. Thyroid  function studies No results for input(s): TSH, T4TOTAL, T3FREE, THYROIDAB in the last 72 hours.  Invalid input(s): FREET3 Anemia work up No results for input(s): VITAMINB12, FOLATE, FERRITIN, TIBC, IRON, RETICCTPCT in the last 72 hours. Urinalysis    Component Value Date/Time   COLORURINE YELLOW 10/14/2023 1443   APPEARANCEUR HAZY (A) 10/14/2023 1443   LABSPEC 1.021 10/14/2023 1443   PHURINE 5.0 10/14/2023 1443   GLUCOSEU NEGATIVE 10/14/2023 1443   HGBUR NEGATIVE 10/14/2023 1443   BILIRUBINUR NEGATIVE 10/14/2023 1443   KETONESUR NEGATIVE 10/14/2023 1443   PROTEINUR NEGATIVE 10/14/2023 1443   NITRITE NEGATIVE 10/14/2023 1443   LEUKOCYTESUR MODERATE (A) 10/14/2023 1443   Sepsis Labs Recent Labs  Lab 10/17/23 0458 10/18/23 0520 10/19/23 0447 10/20/23 0458  WBC 12.8* 10.9* 12.0* 13.2*   Microbiology Recent Results (from the past 240 hours)  Blood Culture (routine x 2)     Status: None   Collection Time: 10/14/23  2:27 PM   Specimen: Right Antecubital; Blood  Result Value Ref Range Status   Specimen Description   Final    RIGHT ANTECUBITAL BOTTLES DRAWN AEROBIC AND ANAEROBIC   Special Requests   Final    Blood Culture results may not be optimal due to an inadequate volume of blood received in culture bottles   Culture   Final     NO GROWTH 5 DAYS Performed at [facility], [address] [place], [place] [address]    Report Status 10/19/2023 FINAL  Final  Urine Culture     Status: Abnormal   Collection Time: 10/14/23  2:43 PM   Specimen: Urine, Random  Result Value Ref Range Status   Specimen Description   Final    URINE, RANDOM Performed at [facility], [address] [place], [place] [address]    Special Requests   Final    NONE Reflexed from [id number] Performed at [facility], [address], [place], [place] [address]    Culture (A)  Final    <10,000 COLONIES/mL INSIGNIFICANT GROWTH Performed at [facility], [address], [place], [place] [address]    Report Status 10/15/2023 FINAL  Final  Blood Culture (routine x 2)     Status: None   Collection Time: 10/14/23  3:41 PM   Specimen: BLOOD LEFT HAND  Result Value Ref Range Status   Specimen Description BLOOD LEFT HAND AEROBIC BOTTLE ONLY  Final   Special Requests  Final    Blood Culture results may not be optimal due to an inadequate volume of blood received in culture bottles   Culture   Final    NO GROWTH 5 DAYS Performed at [facility], [address] [place], [place] [address]    Report Status 10/19/2023 FINAL  Final  MRSA Next Gen by PCR, Nasal     Status: None   Collection Time: 10/14/23  5:50 PM   Specimen: Nasal Mucosa; Nasal Swab  Result Value Ref Range Status   MRSA by PCR Next Gen NOT DETECTED NOT DETECTED Final    Comment: (NOTE) The GeneXpert MRSA Assay (FDA approved for NASAL specimens only), is one component of a comprehensive MRSA colonization surveillance program. It is not intended to diagnose MRSA infection nor to guide or monitor treatment for MRSA infections. Test performance is not FDA approved in patients less than 22 years old. Performed at [facility], [address], [place], [place] [address]    Time coordinating discharge:  44 mins   SIGNED:  Afton Louder, MD  [facility] 10/20/2023, 11:09  AM How to contact the [facility] Attending or Consulting provider 7A - 7P or covering provider during after hours 7P -7A, for this patient?  Check the care team in [facility] and look for a) attending/consulting [facility] provider listed and b) the [facility] team listed Log into [email or web address] and use [facility]'s universal password to access. If you do not have the password, please contact the hospital operator. Locate the [facility] provider you are looking for under [facility] and page to a number that you can be directly reached. If you still have difficulty reaching the provider, please page the [facility] (Director on Call) for the Hospitalists listed on amion for assistance.

## 2023-10-20 NOTE — Care Management Important Message (Signed)
 Important Message  Patient Details  Name: Angela [name] MRN: [id number] Date of Birth: 07/15/1955   Important Message Given:  Yes - Medicare IM     Ellice Boultinghouse L Adriena Manfre 10/20/2023, 11:57 AM

## 2023-10-20 NOTE — Plan of Care (Signed)
   Problem: Safety: Goal: Ability to remain free from injury will improve Outcome: Progressing

## 2023-10-20 NOTE — TOC Transition Note (Signed)
 Transition of Care [facility]) - Discharge Note  Patient Details  Name: Angela [name] MRN: [id number] Date of Birth: 05-08-1955  Transition of Care [facility]) CM/SW Contact:  Lucie Lunger, LCSWA Phone Number: 10/20/2023, 11:54 AM  Clinical Narrative:    CSW updated that pt is medically stable for D/C today. CSW spoke to Kia with [facility] who states they are ready to accept pt today. D/C clinicals sent to facility via HUB. CSW updated pts son of plan for D/C today. RN provided with room and report numbers. Med necessity printed to floor for RN. EMS called for transport. TOC signing off.   Final next level of care: Skilled Nursing Facility Barriers to Discharge: Barriers Resolved   Patient Goals and CMS Choice Patient states their goals for this hospitalization and ongoing recovery are:: go to SNF CMS Medicare.gov Compare Post Acute Care list provided to:: Patient Choice offered to / list presented to : Adult Children, Patient  ownership interest in [facility].provided to:: Patient    Discharge Placement              Patient chooses bed at: [facility] Patient to be transferred to facility by: EMS Name of family member notified: Son Patient and family notified of of transfer: 10/20/23  Discharge Plan and Services Additional resources added to the After Visit Summary for   In-house Referral: Clinical Social Work Discharge Planning Services: CM Consult Post Acute Care Choice: Skilled Nursing Facility                               Social Drivers of Health (SDOH) Interventions SDOH Screenings   Food Insecurity: No Food Insecurity (10/14/2023)  Housing: Low Risk  (10/14/2023)  Transportation Needs: No Transportation Needs (10/14/2023)  Utilities: Not At Risk (10/14/2023)  Social Connections: Socially Isolated (10/14/2023)  Tobacco Use: Low Risk  (10/14/2023)     Readmission Risk Interventions    10/15/2023    5:32 PM  Readmission Risk Prevention  Plan  Transportation Screening Complete  PCP or Specialist Appt within 5-7 Days Complete  Home Care Screening Complete  Medication Review (RN CM) Complete

## 2023-10-20 NOTE — Plan of Care (Signed)
  Problem: Education: Goal: Knowledge of General Education information will improve Description: Including pain rating scale, medication(s)/side effects and non-pharmacologic comfort measures 10/20/2023 1218 by Mathews Norleen POUR, RN Outcome: Adequate for Discharge 10/20/2023 0926 by Mathews Norleen POUR, RN Outcome: Progressing   Problem: Health Behavior/Discharge Planning: Goal: Ability to manage health-related needs will improve 10/20/2023 1218 by Mathews Norleen POUR, RN Outcome: Adequate for Discharge 10/20/2023 0926 by Mathews Norleen POUR, RN Outcome: Progressing   Problem: Clinical Measurements: Goal: Ability to maintain clinical measurements within normal limits will improve 10/20/2023 1218 by Mathews Norleen POUR, RN Outcome: Adequate for Discharge 10/20/2023 0926 by Mathews Norleen POUR, RN Outcome: Progressing Goal: Will remain free from infection 10/20/2023 1218 by Mathews Norleen POUR, RN Outcome: Adequate for Discharge 10/20/2023 0926 by Mathews Norleen POUR, RN Outcome: Progressing Goal: Diagnostic test results will improve 10/20/2023 1218 by Mathews Norleen POUR, RN Outcome: Adequate for Discharge 10/20/2023 0926 by Mathews Norleen POUR, RN Outcome: Progressing Goal: Respiratory complications will improve 10/20/2023 1218 by Mathews Norleen POUR, RN Outcome: Adequate for Discharge 10/20/2023 0926 by Mathews Norleen POUR, RN Outcome: Progressing Goal: Cardiovascular complication will be avoided 10/20/2023 1218 by Mathews Norleen POUR, RN Outcome: Adequate for Discharge 10/20/2023 0926 by Mathews Norleen POUR, RN Outcome: Progressing   Problem: Activity: Goal: Risk for activity intolerance will decrease 10/20/2023 1218 by Mathews Norleen POUR, RN Outcome: Adequate for Discharge 10/20/2023 0926 by Mathews Norleen POUR, RN Outcome: Progressing   Problem: Nutrition: Goal: Adequate nutrition will be maintained 10/20/2023 1218 by Mathews Norleen POUR, RN Outcome: Adequate for Discharge 10/20/2023 0926 by Mathews Norleen POUR, RN Outcome: Progressing   Problem: Coping: Goal: Level of anxiety  will decrease 10/20/2023 1218 by Mathews Norleen POUR, RN Outcome: Adequate for Discharge 10/20/2023 0926 by Mathews Norleen POUR, RN Outcome: Progressing   Problem: Elimination: Goal: Will not experience complications related to bowel motility 10/20/2023 1218 by Mathews Norleen POUR, RN Outcome: Adequate for Discharge 10/20/2023 0926 by Mathews Norleen POUR, RN Outcome: Progressing Goal: Will not experience complications related to urinary retention 10/20/2023 1218 by Mathews Norleen POUR, RN Outcome: Adequate for Discharge 10/20/2023 0926 by Mathews Norleen POUR, RN Outcome: Progressing   Problem: Pain Managment: Goal: General experience of comfort will improve and/or be controlled 10/20/2023 1218 by Mathews Norleen POUR, RN Outcome: Adequate for Discharge 10/20/2023 0926 by Mathews Norleen POUR, RN Outcome: Progressing   Problem: Safety: Goal: Ability to remain free from injury will improve 10/20/2023 1218 by Mathews Norleen POUR, RN Outcome: Adequate for Discharge 10/20/2023 0926 by Mathews Norleen POUR, RN Outcome: Progressing   Problem: Skin Integrity: Goal: Risk for impaired skin integrity will decrease 10/20/2023 1218 by Mathews Norleen POUR, RN Outcome: Adequate for Discharge 10/20/2023 0926 by Mathews Norleen POUR, RN Outcome: Progressing

## 2023-10-21 DIAGNOSIS — L03116 Cellulitis of left lower limb: Secondary | ICD-10-CM | POA: Diagnosis not present

## 2023-10-21 DIAGNOSIS — Z8709 Personal history of other diseases of the respiratory system: Secondary | ICD-10-CM | POA: Diagnosis not present

## 2023-10-21 DIAGNOSIS — E039 Hypothyroidism, unspecified: Secondary | ICD-10-CM | POA: Diagnosis not present

## 2023-10-21 DIAGNOSIS — I482 Chronic atrial fibrillation, unspecified: Secondary | ICD-10-CM | POA: Diagnosis not present

## 2023-10-21 DIAGNOSIS — N182 Chronic kidney disease, stage 2 (mild): Secondary | ICD-10-CM | POA: Diagnosis not present

## 2023-10-21 DIAGNOSIS — E785 Hyperlipidemia, unspecified: Secondary | ICD-10-CM | POA: Diagnosis not present

## 2023-10-21 DIAGNOSIS — I872 Venous insufficiency (chronic) (peripheral): Secondary | ICD-10-CM | POA: Diagnosis not present

## 2023-10-21 DIAGNOSIS — L03115 Cellulitis of right lower limb: Secondary | ICD-10-CM | POA: Diagnosis not present

## 2023-10-21 DIAGNOSIS — Z7901 Long term (current) use of anticoagulants: Secondary | ICD-10-CM | POA: Diagnosis not present

## 2023-10-22 DIAGNOSIS — I872 Venous insufficiency (chronic) (peripheral): Secondary | ICD-10-CM | POA: Diagnosis not present

## 2023-10-22 DIAGNOSIS — R531 Weakness: Secondary | ICD-10-CM | POA: Diagnosis not present

## 2023-10-22 DIAGNOSIS — R52 Pain, unspecified: Secondary | ICD-10-CM | POA: Diagnosis not present

## 2023-10-22 DIAGNOSIS — Z7409 Other reduced mobility: Secondary | ICD-10-CM | POA: Diagnosis not present

## 2023-10-22 DIAGNOSIS — L03115 Cellulitis of right lower limb: Secondary | ICD-10-CM | POA: Diagnosis not present

## 2023-10-22 DIAGNOSIS — L03116 Cellulitis of left lower limb: Secondary | ICD-10-CM | POA: Diagnosis not present

## 2023-10-23 DIAGNOSIS — R531 Weakness: Secondary | ICD-10-CM | POA: Diagnosis not present

## 2023-10-23 DIAGNOSIS — R42 Dizziness and giddiness: Secondary | ICD-10-CM | POA: Diagnosis not present

## 2023-10-23 DIAGNOSIS — I872 Venous insufficiency (chronic) (peripheral): Secondary | ICD-10-CM | POA: Diagnosis not present

## 2023-10-23 DIAGNOSIS — L308 Other specified dermatitis: Secondary | ICD-10-CM | POA: Diagnosis not present

## 2023-10-23 DIAGNOSIS — L03116 Cellulitis of left lower limb: Secondary | ICD-10-CM | POA: Diagnosis not present

## 2023-10-23 DIAGNOSIS — Z7409 Other reduced mobility: Secondary | ICD-10-CM | POA: Diagnosis not present

## 2023-10-23 DIAGNOSIS — L03115 Cellulitis of right lower limb: Secondary | ICD-10-CM | POA: Diagnosis not present

## 2023-10-23 DIAGNOSIS — I951 Orthostatic hypotension: Secondary | ICD-10-CM | POA: Diagnosis not present

## 2023-10-24 DIAGNOSIS — G4700 Insomnia, unspecified: Secondary | ICD-10-CM | POA: Diagnosis not present

## 2023-10-24 DIAGNOSIS — R531 Weakness: Secondary | ICD-10-CM | POA: Diagnosis not present

## 2023-10-24 DIAGNOSIS — R110 Nausea: Secondary | ICD-10-CM | POA: Diagnosis not present

## 2023-10-24 DIAGNOSIS — E782 Mixed hyperlipidemia: Secondary | ICD-10-CM | POA: Diagnosis not present

## 2023-10-24 DIAGNOSIS — M542 Cervicalgia: Secondary | ICD-10-CM | POA: Diagnosis not present

## 2023-10-24 DIAGNOSIS — E039 Hypothyroidism, unspecified: Secondary | ICD-10-CM | POA: Diagnosis not present

## 2023-10-24 DIAGNOSIS — Z7409 Other reduced mobility: Secondary | ICD-10-CM | POA: Diagnosis not present

## 2023-10-24 DIAGNOSIS — L03116 Cellulitis of left lower limb: Secondary | ICD-10-CM | POA: Diagnosis not present

## 2023-10-24 DIAGNOSIS — L03115 Cellulitis of right lower limb: Secondary | ICD-10-CM | POA: Diagnosis not present

## 2023-10-25 DIAGNOSIS — L03116 Cellulitis of left lower limb: Secondary | ICD-10-CM | POA: Diagnosis not present

## 2023-10-25 DIAGNOSIS — R110 Nausea: Secondary | ICD-10-CM | POA: Diagnosis not present

## 2023-10-25 DIAGNOSIS — R531 Weakness: Secondary | ICD-10-CM | POA: Diagnosis not present

## 2023-10-25 DIAGNOSIS — L03115 Cellulitis of right lower limb: Secondary | ICD-10-CM | POA: Diagnosis not present

## 2023-10-25 DIAGNOSIS — M542 Cervicalgia: Secondary | ICD-10-CM | POA: Diagnosis not present

## 2023-10-25 DIAGNOSIS — Z7409 Other reduced mobility: Secondary | ICD-10-CM | POA: Diagnosis not present

## 2023-10-25 DIAGNOSIS — R197 Diarrhea, unspecified: Secondary | ICD-10-CM | POA: Diagnosis not present

## 2023-10-26 DIAGNOSIS — Z7409 Other reduced mobility: Secondary | ICD-10-CM | POA: Diagnosis not present

## 2023-10-26 DIAGNOSIS — L03116 Cellulitis of left lower limb: Secondary | ICD-10-CM | POA: Diagnosis not present

## 2023-10-26 DIAGNOSIS — L03115 Cellulitis of right lower limb: Secondary | ICD-10-CM | POA: Diagnosis not present

## 2023-10-26 DIAGNOSIS — R531 Weakness: Secondary | ICD-10-CM | POA: Diagnosis not present

## 2023-10-27 DIAGNOSIS — I1310 Hypertensive heart and chronic kidney disease without heart failure, with stage 1 through stage 4 chronic kidney disease, or unspecified chronic kidney disease: Secondary | ICD-10-CM | POA: Diagnosis not present

## 2023-10-27 DIAGNOSIS — A419 Sepsis, unspecified organism: Secondary | ICD-10-CM | POA: Diagnosis not present

## 2023-10-27 DIAGNOSIS — N179 Acute kidney failure, unspecified: Secondary | ICD-10-CM | POA: Diagnosis not present

## 2023-10-28 DIAGNOSIS — Z7409 Other reduced mobility: Secondary | ICD-10-CM | POA: Diagnosis not present

## 2023-10-28 DIAGNOSIS — R531 Weakness: Secondary | ICD-10-CM | POA: Diagnosis not present

## 2023-10-28 DIAGNOSIS — L03116 Cellulitis of left lower limb: Secondary | ICD-10-CM | POA: Diagnosis not present

## 2023-10-28 DIAGNOSIS — I482 Chronic atrial fibrillation, unspecified: Secondary | ICD-10-CM | POA: Diagnosis not present

## 2023-10-28 DIAGNOSIS — E039 Hypothyroidism, unspecified: Secondary | ICD-10-CM | POA: Diagnosis not present

## 2023-10-28 DIAGNOSIS — L03115 Cellulitis of right lower limb: Secondary | ICD-10-CM | POA: Diagnosis not present

## 2023-10-28 DIAGNOSIS — R110 Nausea: Secondary | ICD-10-CM | POA: Diagnosis not present

## 2023-10-28 DIAGNOSIS — R52 Pain, unspecified: Secondary | ICD-10-CM | POA: Diagnosis not present

## 2023-10-28 DIAGNOSIS — E785 Hyperlipidemia, unspecified: Secondary | ICD-10-CM | POA: Diagnosis not present

## 2023-10-28 DIAGNOSIS — I1310 Hypertensive heart and chronic kidney disease without heart failure, with stage 1 through stage 4 chronic kidney disease, or unspecified chronic kidney disease: Secondary | ICD-10-CM | POA: Diagnosis not present

## 2023-10-29 DIAGNOSIS — I1310 Hypertensive heart and chronic kidney disease without heart failure, with stage 1 through stage 4 chronic kidney disease, or unspecified chronic kidney disease: Secondary | ICD-10-CM | POA: Diagnosis not present

## 2023-10-29 DIAGNOSIS — L03116 Cellulitis of left lower limb: Secondary | ICD-10-CM | POA: Diagnosis not present

## 2023-10-29 DIAGNOSIS — R52 Pain, unspecified: Secondary | ICD-10-CM | POA: Diagnosis not present

## 2023-10-29 DIAGNOSIS — R110 Nausea: Secondary | ICD-10-CM | POA: Diagnosis not present

## 2023-10-29 DIAGNOSIS — E785 Hyperlipidemia, unspecified: Secondary | ICD-10-CM | POA: Diagnosis not present

## 2023-10-29 DIAGNOSIS — I482 Chronic atrial fibrillation, unspecified: Secondary | ICD-10-CM | POA: Diagnosis not present

## 2023-10-29 DIAGNOSIS — R531 Weakness: Secondary | ICD-10-CM | POA: Diagnosis not present

## 2023-10-29 DIAGNOSIS — Z7409 Other reduced mobility: Secondary | ICD-10-CM | POA: Diagnosis not present

## 2023-10-29 DIAGNOSIS — L03115 Cellulitis of right lower limb: Secondary | ICD-10-CM | POA: Diagnosis not present

## 2023-10-29 DIAGNOSIS — E039 Hypothyroidism, unspecified: Secondary | ICD-10-CM | POA: Diagnosis not present

## 2023-10-31 DIAGNOSIS — R531 Weakness: Secondary | ICD-10-CM | POA: Diagnosis not present

## 2023-10-31 DIAGNOSIS — Z7901 Long term (current) use of anticoagulants: Secondary | ICD-10-CM | POA: Diagnosis not present

## 2023-10-31 DIAGNOSIS — I951 Orthostatic hypotension: Secondary | ICD-10-CM | POA: Diagnosis not present

## 2023-10-31 DIAGNOSIS — Z7409 Other reduced mobility: Secondary | ICD-10-CM | POA: Diagnosis not present

## 2023-10-31 DIAGNOSIS — I482 Chronic atrial fibrillation, unspecified: Secondary | ICD-10-CM | POA: Diagnosis not present

## 2023-10-31 DIAGNOSIS — I872 Venous insufficiency (chronic) (peripheral): Secondary | ICD-10-CM | POA: Diagnosis not present

## 2023-10-31 DIAGNOSIS — R42 Dizziness and giddiness: Secondary | ICD-10-CM | POA: Diagnosis not present

## 2023-11-01 DIAGNOSIS — I872 Venous insufficiency (chronic) (peripheral): Secondary | ICD-10-CM | POA: Diagnosis not present

## 2023-11-01 DIAGNOSIS — E8809 Other disorders of plasma-protein metabolism, not elsewhere classified: Secondary | ICD-10-CM | POA: Diagnosis not present

## 2023-11-01 DIAGNOSIS — L03116 Cellulitis of left lower limb: Secondary | ICD-10-CM | POA: Diagnosis not present

## 2023-11-01 DIAGNOSIS — M6281 Muscle weakness (generalized): Secondary | ICD-10-CM | POA: Diagnosis not present

## 2023-11-02 DIAGNOSIS — E039 Hypothyroidism, unspecified: Secondary | ICD-10-CM | POA: Diagnosis not present

## 2023-11-02 DIAGNOSIS — E782 Mixed hyperlipidemia: Secondary | ICD-10-CM | POA: Diagnosis not present

## 2023-11-02 DIAGNOSIS — G4700 Insomnia, unspecified: Secondary | ICD-10-CM | POA: Diagnosis not present

## 2023-11-02 DIAGNOSIS — R42 Dizziness and giddiness: Secondary | ICD-10-CM | POA: Diagnosis not present

## 2023-11-02 DIAGNOSIS — R531 Weakness: Secondary | ICD-10-CM | POA: Diagnosis not present

## 2023-11-02 DIAGNOSIS — I951 Orthostatic hypotension: Secondary | ICD-10-CM | POA: Diagnosis not present

## 2023-11-02 DIAGNOSIS — I872 Venous insufficiency (chronic) (peripheral): Secondary | ICD-10-CM | POA: Diagnosis not present

## 2023-11-02 DIAGNOSIS — Z872 Personal history of diseases of the skin and subcutaneous tissue: Secondary | ICD-10-CM | POA: Diagnosis not present

## 2023-11-02 DIAGNOSIS — Z7409 Other reduced mobility: Secondary | ICD-10-CM | POA: Diagnosis not present

## 2023-11-03 DIAGNOSIS — I872 Venous insufficiency (chronic) (peripheral): Secondary | ICD-10-CM | POA: Diagnosis not present

## 2023-11-03 DIAGNOSIS — L309 Dermatitis, unspecified: Secondary | ICD-10-CM | POA: Diagnosis not present

## 2023-11-03 DIAGNOSIS — R531 Weakness: Secondary | ICD-10-CM | POA: Diagnosis not present

## 2023-11-03 DIAGNOSIS — Z872 Personal history of diseases of the skin and subcutaneous tissue: Secondary | ICD-10-CM | POA: Diagnosis not present

## 2023-11-03 DIAGNOSIS — Z7409 Other reduced mobility: Secondary | ICD-10-CM | POA: Diagnosis not present

## 2023-11-04 DIAGNOSIS — R531 Weakness: Secondary | ICD-10-CM | POA: Diagnosis not present

## 2023-11-04 DIAGNOSIS — Z7409 Other reduced mobility: Secondary | ICD-10-CM | POA: Diagnosis not present

## 2023-11-04 DIAGNOSIS — Z872 Personal history of diseases of the skin and subcutaneous tissue: Secondary | ICD-10-CM | POA: Diagnosis not present

## 2023-11-04 DIAGNOSIS — L309 Dermatitis, unspecified: Secondary | ICD-10-CM | POA: Diagnosis not present

## 2023-11-04 DIAGNOSIS — B372 Candidiasis of skin and nail: Secondary | ICD-10-CM | POA: Diagnosis not present

## 2023-11-04 DIAGNOSIS — I872 Venous insufficiency (chronic) (peripheral): Secondary | ICD-10-CM | POA: Diagnosis not present

## 2023-11-06 DIAGNOSIS — T148XXA Other injury of unspecified body region, initial encounter: Secondary | ICD-10-CM | POA: Diagnosis not present

## 2023-11-06 DIAGNOSIS — L309 Dermatitis, unspecified: Secondary | ICD-10-CM | POA: Diagnosis not present

## 2023-11-06 DIAGNOSIS — Z872 Personal history of diseases of the skin and subcutaneous tissue: Secondary | ICD-10-CM | POA: Diagnosis not present

## 2023-11-06 DIAGNOSIS — R531 Weakness: Secondary | ICD-10-CM | POA: Diagnosis not present

## 2023-11-06 DIAGNOSIS — Z7409 Other reduced mobility: Secondary | ICD-10-CM | POA: Diagnosis not present

## 2023-11-06 DIAGNOSIS — I872 Venous insufficiency (chronic) (peripheral): Secondary | ICD-10-CM | POA: Diagnosis not present

## 2023-11-07 DIAGNOSIS — I1310 Hypertensive heart and chronic kidney disease without heart failure, with stage 1 through stage 4 chronic kidney disease, or unspecified chronic kidney disease: Secondary | ICD-10-CM | POA: Diagnosis not present

## 2023-11-08 ENCOUNTER — Encounter: Admitting: Pulmonary Disease

## 2023-11-08 DIAGNOSIS — I872 Venous insufficiency (chronic) (peripheral): Secondary | ICD-10-CM | POA: Diagnosis not present

## 2023-11-08 DIAGNOSIS — M6281 Muscle weakness (generalized): Secondary | ICD-10-CM | POA: Diagnosis not present

## 2023-11-08 DIAGNOSIS — E8809 Other disorders of plasma-protein metabolism, not elsewhere classified: Secondary | ICD-10-CM | POA: Diagnosis not present

## 2023-11-08 DIAGNOSIS — L03116 Cellulitis of left lower limb: Secondary | ICD-10-CM | POA: Diagnosis not present

## 2023-11-09 DIAGNOSIS — I482 Chronic atrial fibrillation, unspecified: Secondary | ICD-10-CM | POA: Diagnosis not present

## 2023-11-09 DIAGNOSIS — Z7409 Other reduced mobility: Secondary | ICD-10-CM | POA: Diagnosis not present

## 2023-11-09 DIAGNOSIS — R531 Weakness: Secondary | ICD-10-CM | POA: Diagnosis not present

## 2023-11-10 DIAGNOSIS — E039 Hypothyroidism, unspecified: Secondary | ICD-10-CM | POA: Diagnosis not present

## 2023-11-10 DIAGNOSIS — Z7409 Other reduced mobility: Secondary | ICD-10-CM | POA: Diagnosis not present

## 2023-11-10 DIAGNOSIS — I482 Chronic atrial fibrillation, unspecified: Secondary | ICD-10-CM | POA: Diagnosis not present

## 2023-11-10 DIAGNOSIS — R531 Weakness: Secondary | ICD-10-CM | POA: Diagnosis not present

## 2023-11-10 DIAGNOSIS — E785 Hyperlipidemia, unspecified: Secondary | ICD-10-CM | POA: Diagnosis not present

## 2023-11-10 DIAGNOSIS — L309 Dermatitis, unspecified: Secondary | ICD-10-CM | POA: Diagnosis not present

## 2023-11-10 DIAGNOSIS — B372 Candidiasis of skin and nail: Secondary | ICD-10-CM | POA: Diagnosis not present

## 2023-11-10 DIAGNOSIS — I1310 Hypertensive heart and chronic kidney disease without heart failure, with stage 1 through stage 4 chronic kidney disease, or unspecified chronic kidney disease: Secondary | ICD-10-CM | POA: Diagnosis not present

## 2023-11-11 DIAGNOSIS — L03115 Cellulitis of right lower limb: Secondary | ICD-10-CM | POA: Diagnosis not present

## 2023-11-11 DIAGNOSIS — M6281 Muscle weakness (generalized): Secondary | ICD-10-CM | POA: Diagnosis not present

## 2023-11-11 DIAGNOSIS — J9601 Acute respiratory failure with hypoxia: Secondary | ICD-10-CM | POA: Diagnosis not present

## 2023-11-11 DIAGNOSIS — L03116 Cellulitis of left lower limb: Secondary | ICD-10-CM | POA: Diagnosis not present

## 2023-11-11 DIAGNOSIS — E8809 Other disorders of plasma-protein metabolism, not elsewhere classified: Secondary | ICD-10-CM | POA: Diagnosis not present

## 2023-11-13 DIAGNOSIS — L03116 Cellulitis of left lower limb: Secondary | ICD-10-CM | POA: Diagnosis not present

## 2023-11-14 DIAGNOSIS — E8809 Other disorders of plasma-protein metabolism, not elsewhere classified: Secondary | ICD-10-CM | POA: Diagnosis not present

## 2023-11-14 DIAGNOSIS — M6281 Muscle weakness (generalized): Secondary | ICD-10-CM | POA: Diagnosis not present

## 2023-11-14 DIAGNOSIS — J9601 Acute respiratory failure with hypoxia: Secondary | ICD-10-CM | POA: Diagnosis not present

## 2023-11-14 DIAGNOSIS — L03115 Cellulitis of right lower limb: Secondary | ICD-10-CM | POA: Diagnosis not present

## 2023-11-15 DIAGNOSIS — L03116 Cellulitis of left lower limb: Secondary | ICD-10-CM | POA: Diagnosis not present

## 2023-11-15 DIAGNOSIS — L03115 Cellulitis of right lower limb: Secondary | ICD-10-CM | POA: Diagnosis not present

## 2023-11-15 DIAGNOSIS — J9601 Acute respiratory failure with hypoxia: Secondary | ICD-10-CM | POA: Diagnosis not present

## 2023-11-15 DIAGNOSIS — E8809 Other disorders of plasma-protein metabolism, not elsewhere classified: Secondary | ICD-10-CM | POA: Diagnosis not present

## 2023-11-21 ENCOUNTER — Telehealth: Payer: Self-pay | Admitting: Cardiology

## 2023-11-21 NOTE — Telephone Encounter (Signed)
 Spoke to Mallori and advised providers recommendations. Mallori verbalized understanding and will contact PCP's office for orders

## 2023-11-21 NOTE — Telephone Encounter (Signed)
 Caller [facility]) stated patient will not have a PCP until January and wants to know if Dr. Debera will sign off on patient's home health Occupational Therapy orders.

## 2023-11-24 DIAGNOSIS — L03116 Cellulitis of left lower limb: Secondary | ICD-10-CM | POA: Diagnosis not present

## 2023-12-04 ENCOUNTER — Encounter: Admitting: Pulmonary Disease

## 2024-01-23 ENCOUNTER — Telehealth: Payer: Self-pay | Admitting: Cardiology

## 2024-01-23 NOTE — Telephone Encounter (Signed)
 Pt is requesting a refill on torsemide . This medication was prescribed in the hospital by another provider. Would Dr. Debera refill this medication? Please address

## 2024-01-23 NOTE — Telephone Encounter (Signed)
*  STAT* If patient is at the pharmacy, call can be transferred to refill team.   1. Which medications need to be refilled? (please list name of each medication and dose if known)   torsemide  (DEMADEX ) 10 MG tablet    2. Which pharmacy/location (including street and city if local pharmacy) is medication to be sent to?  [facility] [phone] - [place], [place] - [address] AT [facility] [address]      3. Do they need a 30 day or 90 day supply? 90 day    Pt is out of medication, scheduled for office visit in February and placed on wait list.

## 2024-01-24 ENCOUNTER — Other Ambulatory Visit (HOSPITAL_COMMUNITY): Payer: Self-pay

## 2024-01-24 MED ORDER — TORSEMIDE 10 MG PO TABS
10.0000 mg | ORAL_TABLET | Freq: Every day | ORAL | 3 refills | Status: DC
  Filled 2024-01-24: qty 90, 90d supply, fill #0

## 2024-01-24 NOTE — Telephone Encounter (Signed)
  Patient call back to follow up. Refill was sent to incorrect pharmacy. Please send it to  [facility] [phone] - [place], [place] - [address] AT [facility] OF [address]

## 2024-01-25 ENCOUNTER — Other Ambulatory Visit (HOSPITAL_COMMUNITY): Payer: Self-pay

## 2024-01-25 MED ORDER — TORSEMIDE 10 MG PO TABS
10.0000 mg | ORAL_TABLET | Freq: Every day | ORAL | 1 refills | Status: AC
Start: 2024-01-25 — End: ?

## 2024-01-25 MED ORDER — TORSEMIDE 10 MG PO TABS: 10.0000 mg | ORAL_TABLET | Freq: Every day | ORAL | 1 refills | Status: DC

## 2024-01-25 NOTE — Telephone Encounter (Signed)
 Pt's medication was resent to pt's preferred pharmacy as requested. Confirmation received.

## 2024-01-25 NOTE — Telephone Encounter (Signed)
 Patient calling again to f/u on refill. Refill was sent to incorrect pharmacy. Refills should be going to [facility] [phone] - [place], [place] - [address] AT [facility] [address]  Please advise.

## 2024-01-25 NOTE — Addendum Note (Signed)
 Addended by: BLUFORD RAMP D on: 01/25/2024 09:37 AM   Modules accepted: Orders

## 2024-01-26 ENCOUNTER — Telehealth: Payer: Self-pay | Admitting: Cardiology

## 2024-01-26 NOTE — Telephone Encounter (Signed)
 [facility] requesting orders for skin care to the left lower leg 3 times a week. With skin healing ointment and silver alginate. Please advise.

## 2024-01-26 NOTE — Telephone Encounter (Signed)
 I spoke with Sharlet at [facility] and she will reach out to her new pcp for wound care orders.

## 2024-01-29 ENCOUNTER — Encounter: Admitting: Pulmonary Disease

## 2024-02-04 ENCOUNTER — Encounter (HOSPITAL_COMMUNITY): Payer: Self-pay

## 2024-02-04 ENCOUNTER — Other Ambulatory Visit: Payer: Self-pay

## 2024-02-04 ENCOUNTER — Inpatient Hospital Stay (HOSPITAL_COMMUNITY)
Admission: EM | Admit: 2024-02-04 | Discharge: 2024-02-08 | DRG: 603 | Disposition: A | Discharge status: Skilled Nursing Facility | Attending: Hospitalist | Admitting: Hospitalist

## 2024-02-04 DIAGNOSIS — E039 Hypothyroidism, unspecified: Secondary | ICD-10-CM | POA: Diagnosis present

## 2024-02-04 DIAGNOSIS — I4891 Unspecified atrial fibrillation: Secondary | ICD-10-CM | POA: Diagnosis present

## 2024-02-04 DIAGNOSIS — D649 Anemia, unspecified: Secondary | ICD-10-CM | POA: Insufficient documentation

## 2024-02-04 DIAGNOSIS — E785 Hyperlipidemia, unspecified: Secondary | ICD-10-CM | POA: Diagnosis present

## 2024-02-04 DIAGNOSIS — L03116 Cellulitis of left lower limb: Secondary | ICD-10-CM | POA: Diagnosis not present

## 2024-02-04 DIAGNOSIS — L03119 Cellulitis of unspecified part of limb: Principal | ICD-10-CM | POA: Diagnosis present

## 2024-02-04 DIAGNOSIS — N390 Urinary tract infection, site not specified: Secondary | ICD-10-CM | POA: Diagnosis present

## 2024-02-04 DIAGNOSIS — N1832 Chronic kidney disease, stage 3b: Secondary | ICD-10-CM | POA: Diagnosis present

## 2024-02-04 LAB — CBC WITH DIFFERENTIAL/PLATELET
Abs Immature Granulocytes: 0.06 K/uL (ref 0.00–0.07)
Basophils Absolute: 0 K/uL (ref 0.0–0.1)
Basophils Relative: 0 %
Eosinophils Absolute: 0.1 K/uL (ref 0.0–0.5)
Eosinophils Relative: 1 %
HCT: 34.3 % — ABNORMAL LOW (ref 36.0–46.0)
Hemoglobin: 10.6 g/dL — ABNORMAL LOW (ref 12.0–15.0)
Immature Granulocytes: 1 %
Lymphocytes Relative: 9 %
Lymphs Abs: 0.9 K/uL (ref 0.7–4.0)
MCH: 27.2 pg (ref 26.0–34.0)
MCHC: 30.9 g/dL (ref 30.0–36.0)
MCV: 88.2 fL (ref 80.0–100.0)
Monocytes Absolute: 0.8 K/uL (ref 0.1–1.0)
Monocytes Relative: 7 %
Neutro Abs: 9.2 K/uL — ABNORMAL HIGH (ref 1.7–7.7)
Neutrophils Relative %: 82 %
Platelets: 172 K/uL (ref 150–400)
RBC: 3.89 MIL/uL (ref 3.87–5.11)
RDW: 15.2 % (ref 11.5–15.5)
WBC: 11 K/uL — ABNORMAL HIGH (ref 4.0–10.5)
nRBC: 0 % (ref 0.0–0.2)

## 2024-02-04 LAB — BASIC METABOLIC PANEL WITH GFR
Anion gap: 12 (ref 5–15)
BUN: 24 mg/dL — ABNORMAL HIGH (ref 8–23)
CO2: 20 mmol/L — ABNORMAL LOW (ref 22–32)
Calcium: 8.9 mg/dL (ref 8.9–10.3)
Chloride: 108 mmol/L (ref 98–111)
Creatinine, Ser: 1.55 mg/dL — ABNORMAL HIGH (ref 0.44–1.00)
GFR, Estimated: 36 mL/min — ABNORMAL LOW (ref >60)
Glucose, Bld: 104 mg/dL — ABNORMAL HIGH (ref 70–99)
Potassium: 4.2 mmol/L (ref 3.5–5.1)
Sodium: 140 mmol/L (ref 135–145)

## 2024-02-04 LAB — IRON AND TIBC
Iron: <10 ug/dL — ABNORMAL LOW (ref 28–170)
TIBC: 245 ug/dL — ABNORMAL LOW (ref 250–450)

## 2024-02-04 LAB — FERRITIN: Ferritin: 181 ng/mL (ref 11–307)

## 2024-02-04 LAB — URINALYSIS, ROUTINE W REFLEX MICROSCOPIC
Bilirubin Urine: NEGATIVE
Glucose, UA: NEGATIVE mg/dL
Ketones, ur: NEGATIVE mg/dL
Nitrite: NEGATIVE
Protein, ur: 100 mg/dL — AB
RBC / HPF: >50 RBC/hpf (ref 0–5)
Specific Gravity, Urine: 1.013 (ref 1.005–1.030)
WBC, UA: >50 WBC/hpf (ref 0–5)
pH: 7 (ref 5.0–8.0)

## 2024-02-04 LAB — TSH: TSH: 2.33 u[IU]/mL (ref 0.350–4.500)

## 2024-02-04 LAB — LACTIC ACID, PLASMA
Lactic Acid, Venous: 1.4 mmol/L (ref 0.5–1.9)
Lactic Acid, Venous: 1.6 mmol/L (ref 0.5–1.9)

## 2024-02-04 LAB — RETICULOCYTES
Immature Retic Fract: 18.3 % — ABNORMAL HIGH (ref 2.3–15.9)
RBC.: 3.9 MIL/uL (ref 3.87–5.11)
Retic Count, Absolute: 43.7 K/uL (ref 19.0–186.0)
Retic Ct Pct: 1.1 % (ref 0.4–3.1)

## 2024-02-04 LAB — FOLATE: Folate: 5.8 ng/mL — ABNORMAL LOW (ref >5.9)

## 2024-02-04 LAB — VITAMIN B12: Vitamin B-12: 395 pg/mL (ref 180–914)

## 2024-02-04 MED ORDER — ACETAMINOPHEN 650 MG RE SUPP: 650.0000 mg | Freq: Four times a day (QID) | RECTAL | Status: DC | PRN

## 2024-02-04 MED ORDER — ACETAMINOPHEN 500 MG PO TABS
1000.0000 mg | ORAL_TABLET | Freq: Four times a day (QID) | ORAL | Status: DC | PRN
  Administered 2024-02-07 – 2024-02-08 (×2): 1000 mg via ORAL
  Filled 2024-02-04 (×2): qty 2

## 2024-02-04 MED ORDER — GERHARDT'S BUTT CREAM
TOPICAL_CREAM | Freq: Two times a day (BID) | CUTANEOUS | Status: DC
  Filled 2024-02-04: qty 60

## 2024-02-04 MED ORDER — ASCORBIC ACID 500 MG PO TABS
250.0000 mg | ORAL_TABLET | Freq: Every day | ORAL | Status: DC
  Filled 2024-02-04: qty 0.5

## 2024-02-04 MED ORDER — APIXABAN 5 MG PO TABS
5.0000 mg | ORAL_TABLET | Freq: Two times a day (BID) | ORAL | Status: DC
  Administered 2024-02-04 – 2024-02-08 (×8): 5 mg via ORAL
  Filled 2024-02-04 (×8): qty 1

## 2024-02-04 MED ORDER — COLLAGENASE 250 UNIT/GM EX OINT
TOPICAL_OINTMENT | Freq: Every day | CUTANEOUS | Status: DC
  Administered 2024-02-05: 1 via TOPICAL
  Filled 2024-02-04: qty 30

## 2024-02-04 MED ORDER — CEFAZOLIN SODIUM-DEXTROSE 2-4 GM/100ML-% IV SOLN
2.0000 g | Freq: Once | INTRAVENOUS | Status: AC
  Administered 2024-02-04: 2 g via INTRAVENOUS
  Filled 2024-02-04: qty 100

## 2024-02-04 MED ORDER — SIMVASTATIN 20 MG PO TABS
20.0000 mg | ORAL_TABLET | Freq: Every day | ORAL | Status: DC
  Administered 2024-02-04 – 2024-02-07 (×4): 20 mg via ORAL
  Filled 2024-02-04 (×4): qty 1

## 2024-02-04 MED ORDER — ZOLPIDEM TARTRATE 5 MG PO TABS
5.0000 mg | ORAL_TABLET | Freq: Every evening | ORAL | Status: DC | PRN
  Administered 2024-02-05 – 2024-02-07 (×2): 5 mg via ORAL
  Filled 2024-02-04 (×2): qty 1

## 2024-02-04 MED ORDER — CEFAZOLIN SODIUM-DEXTROSE 2-4 GM/100ML-% IV SOLN: 2.0000 g | Freq: Three times a day (TID) | INTRAVENOUS | Status: DC

## 2024-02-04 MED ORDER — LACTATED RINGERS IV SOLN: INTRAVENOUS | Status: AC

## 2024-02-04 MED ORDER — CEFAZOLIN SODIUM-DEXTROSE 3-4 GM/150ML-% IV SOLN
3.0000 g | Freq: Three times a day (TID) | INTRAVENOUS | Status: DC
  Administered 2024-02-04 – 2024-02-05 (×2): 3 g via INTRAVENOUS
  Filled 2024-02-04 (×7): qty 150

## 2024-02-04 MED ORDER — POLYETHYLENE GLYCOL 3350 17 G PO PACK: 17.0000 g | PACK | Freq: Every day | ORAL | Status: DC | PRN

## 2024-02-04 MED ORDER — NYSTATIN 100000 UNIT/GM EX POWD
Freq: Three times a day (TID) | CUTANEOUS | Status: DC
  Filled 2024-02-04: qty 15

## 2024-02-04 MED ORDER — ACETAMINOPHEN 325 MG PO TABS
650.0000 mg | ORAL_TABLET | Freq: Four times a day (QID) | ORAL | Status: DC | PRN
  Administered 2024-02-05 – 2024-02-06 (×2): 650 mg via ORAL
  Filled 2024-02-04 (×2): qty 2

## 2024-02-04 MED ORDER — FLAX SEED OIL 1000 MG PO CAPS: ORAL_CAPSULE | Freq: Every day | ORAL | Status: DC

## 2024-02-04 MED ORDER — METOPROLOL TARTRATE 5 MG/5ML IV SOLN: 5.0000 mg | INTRAVENOUS | Status: DC | PRN

## 2024-02-04 MED ORDER — LEVOTHYROXINE SODIUM 75 MCG PO TABS
150.0000 ug | ORAL_TABLET | Freq: Every day | ORAL | Status: DC
  Administered 2024-02-05 – 2024-02-08 (×4): 150 ug via ORAL
  Filled 2024-02-04 (×4): qty 2

## 2024-02-04 MED ORDER — VITAMIN D 25 MCG (1000 UNIT) PO TABS
1000.0000 [IU] | ORAL_TABLET | Freq: Every day | ORAL | Status: DC
  Administered 2024-02-05 – 2024-02-08 (×4): 1000 [IU] via ORAL
  Filled 2024-02-04 (×4): qty 1

## 2024-02-04 NOTE — Progress Notes (Signed)
PHARMACIST - PHYSICIAN ORDER COMMUNICATION  CONCERNING: P&T Medication Policy on Herbal Medications  DESCRIPTION:  This patient's order for:  Flaxseed oil  has been noted.  This product(s) is classified as an "herbal" or natural product. Due to a lack of definitive safety studies or FDA approval, nonstandard manufacturing practices, plus the potential risk of unknown drug-drug interactions while on inpatient medications, the Pharmacy and Therapeutics Committee does not permit the use of "herbal" or natural products of this type within [place].   ACTION TAKEN: The pharmacy department is unable to verify this order at this time and your patient has been informed of this safety policy. Please reevaluate patient's clinical condition at discharge and address if the herbal or natural product(s) should be resumed at that time.  

## 2024-02-04 NOTE — Consult Note (Addendum)
 WOC Nurse Consult Note: Reason for Consult: leg wounds  Wound type:1.  Full thickness bilateral posterior calves r/t venous insufficiency with tan necrotic tissue  2.  Full and partial thickness L upper posterior thigh r/t trauma (skin tear) versus friction  3.  Moisture associated Skin damage to sacrum/coccyx with linear partial thickness skin loss red moist  ICD-10 CM Codes for Irritant Dermatitis L24A0 - Due to friction or contact with body fluids; unspecified 4.  Intertriginous dermatitis underneath breasts and pannus; erythema with scattered partial thickness skin loss  30.4  - Erythema intertrigo. Also used for abrasion of the hand, chafing of the skin, dermatitis due to sweating and friction, friction dermatitis, friction eczema, and genital/thigh intertrigo.  Pressure Injury POA: not pressure  Measurement:see nursing flowsheet  Wound bed: as above  Drainage (amount, consistency, odor) see nursing flowsheet  Periwound: erythema around posterior calf wounds; significant erythema and edema L leg; dark purple discoloration L upper thigh ? trauma versus chronic pressure  Dressing procedure/placement/frequency:  Cleanse coccyx/intergluteal cleft with soap and water dry and apply a strip of silver hydrofiber (Lawson [phone]) to area daily and as needed for soiling. Will write Gerhardt's Butt Cream to surrounding skin of sacrum/buttocks 2 times daily.  Cleanse B posterior calf wounds with Vashe wound cleanser, do not rinse.  Apply 1/4 thick layer of Santyl to wound beds daily,  top with saline moist gauze, dry gauze and silicone foam.  Cleanse underneath pannus and breasts with soap and water, dry and apply Nystatin  powder to area.  Apply Minna ARTHURS as follows: Order Gerlean # [phone] Measure and cut length of InterDry to fit in skin folds that have skin breakdown Tuck InterDry fabric into skin folds in a single layer, allow for 2 inches of overhang from skin edges to allow for wicking to  occur May remove to bathe; dry area thoroughly and then tuck into affected areas again Do not apply any creams or ointments when using InterDry DO NOT THROW AWAY FOR 5 DAYS unless soiled with stool DO NOT [facility] product, this will inactivate the silver in the material  New sheet of Interdry should be applied after 5 days of use if patient continues to have skin breakdown  4.  Cleanse L posterior upper thigh with Vashe, do not rinse. Apply Xeroform gauze (Lawson [phone]) to area daily and secure with silicone foam or ABD pad and clothe tape whichever is preferred.   POC discussed with bedside nurse.  Appreciate  KYM Ates, LPN assistance with this consult. WOC team will not follow. Re-consult if further needs arise.   Thank you,    Powell Bar MSN, RN-BC, [employer]

## 2024-02-04 NOTE — Assessment & Plan Note (Signed)
 Creatinine appears close to baseline.  Patient has been taking a lot of Celebrex recently Avoid nephrotoxic agents Trend

## 2024-02-04 NOTE — Assessment & Plan Note (Signed)
 With chronic venous stasis Also with panniculitis lower extremity cellulitis

## 2024-02-04 NOTE — Assessment & Plan Note (Addendum)
 Patient was given Ancef in the ED but previously has been on Unasyn  No signs of sepsis at present Continue antibiotics Consult wound care

## 2024-02-04 NOTE — Assessment & Plan Note (Signed)
 Check anemia panel  Patient does have hematuria which might be contributing here.

## 2024-02-04 NOTE — ED Provider Notes (Signed)
 [place] EMERGENCY DEPARTMENT AT [facility] Provider Note   CSN: [id number] Arrival date & time: 02/04/24  1346     Patient presents with: Cellulitis   Angela [name] is a 68 y.o. female with a history including chronic venous stasis, prior history of cellulitis requiring hospitalization, atrial fibrillation on Coumadin, hypertension and hypothyroidism along with morbid obesity presenting for evaluation of increased pain redness and drainage from her bilateral lower extremities.  The most problematic area is her left posterior lower leg, she describes severe pain and her home health nurse noted today that she has severe redness on the posterior aspect of this leg.  There has been drainage of clear drainage along with some bloody discharge.  The right leg is also involved but is less painful and red.  She endorses chills and diaphoresis, she denies fevers at home.  She is unable to walk currently secondary to severe pain in her legs.   The history is provided by the patient.       Prior to Admission medications   Medication Sig Start Date End Date Taking? Authorizing Provider  acetaminophen  (TYLENOL ) 500 MG tablet Take 1,000 mg by mouth every 6 (six) hours as needed for headache.   Yes [provider]  Ascorbic Acid (VITAMIN C) 100 MG tablet Take 100 mg by mouth daily.   Yes [provider]  celecoxib (CELEBREX) 200 MG capsule Take 200 mg by mouth 2 (two) times daily.   Yes [provider]  cholecalciferol  (VITAMIN D3) 25 MCG (1000 UNIT) tablet Take 1,000 Units by mouth daily.   Yes [provider]  ELIQUIS  5 MG TABS tablet TAKE 1 TABLET BY MOUTH TWICE  DAILY 05/19/23  Yes Debera Jayson MATSU, MD  Flaxseed, Linseed, (FLAX SEED OIL PO) Take 1 capsule by mouth daily.   Yes [provider]  levothyroxine  (SYNTHROID ) 150 MCG tablet Take 150 mcg by mouth daily. 08/03/23  Yes [provider]  simvastatin  (ZOCOR ) 20 MG tablet Take  20 mg by mouth at bedtime. 09/10/19  Yes [provider]  torsemide  (DEMADEX ) 10 MG tablet Take 1 tablet (10 mg total) by mouth daily. 01/25/24  Yes Debera Jayson MATSU, MD  zolpidem  (AMBIEN ) 5 MG tablet Take 1 tablet (5 mg total) by mouth at bedtime as needed for sleep. 10/20/23  Yes Johnson, Clanford L, MD    Allergies: Ibuprofen    Review of Systems  Constitutional:  Negative for chills and fever.  HENT:  Negative for congestion and sore throat.   Eyes: Negative.   Respiratory:  Negative for chest tightness and shortness of breath.   Cardiovascular:  Positive for leg swelling. Negative for chest pain.  Gastrointestinal:  Negative for abdominal pain and nausea.  Genitourinary: Negative.   Musculoskeletal:  Negative for arthralgias, joint swelling and neck pain.  Skin:  Positive for color change and wound. Negative for rash.  Neurological:  Negative for dizziness, weakness, light-headedness, numbness and headaches.  Psychiatric/Behavioral: Negative.      Updated Vital Signs BP 108/72   Pulse 81   Temp 97.8 F (36.6 C) (Oral)   Resp 17   Ht 5' 11 (1.803 m)   SpO2 100%   BMI 59.41 kg/m   Physical Exam Vitals and nursing note reviewed.  Constitutional:      Appearance: She is well-developed.  HENT:     Head: Normocephalic and atraumatic.  Eyes:     Conjunctiva/sclera: Conjunctivae normal.  Cardiovascular:     Rate  and Rhythm: Normal rate and regular rhythm.     Heart sounds: Normal heart sounds.  Pulmonary:     Effort: Pulmonary effort is normal.     Breath sounds: Normal breath sounds. No wheezing.  Abdominal:     General: Bowel sounds are normal.     Palpations: Abdomen is soft.     Tenderness: There is no abdominal tenderness.  Musculoskeletal:        General: Normal range of motion.     Cervical back: Normal range of motion.  Skin:    General: Skin is warm and dry.     Findings: Erythema present.     Comments: Bilateral lower extremity edema with skin  changes consistent with venous stasis.  Her bilateral posterior lower legs are erythematous, left greater than right.  She has shallow weeping ulcer on her left posterior calf, there is another shallow ulcer right posterior calf which has recently bled.  Neurological:     Mental Status: She is alert.     (all labs ordered are listed, but only abnormal results are displayed) Labs Reviewed  CBC WITH DIFFERENTIAL/PLATELET - Abnormal; Notable for the following components:      Result Value   WBC 11.0 (*)    Hemoglobin 10.6 (*)    HCT 34.3 (*)    Neutro Abs 9.2 (*)    All other components within normal limits  BASIC METABOLIC PANEL WITH GFR - Abnormal; Notable for the following components:   CO2 20 (*)    Glucose, Bld 104 (*)    BUN 24 (*)    Creatinine, Ser 1.55 (*)    GFR, Estimated 36 (*)    All other components within normal limits  URINALYSIS, ROUTINE W REFLEX MICROSCOPIC - Abnormal; Notable for the following components:   Color, Urine AMBER (*)    APPearance HAZY (*)    Hgb urine dipstick LARGE (*)    Protein, ur 100 (*)    Leukocytes,Ua MODERATE (*)    Bacteria, UA FEW (*)    All other components within normal limits  CULTURE, BLOOD (ROUTINE X 2)  CULTURE, BLOOD (ROUTINE X 2)  URINE CULTURE  LACTIC ACID, PLASMA  LACTIC ACID, PLASMA    EKG: None  Radiology: No results found.   Procedures   Medications Ordered in the ED  ceFAZolin (ANCEF) IVPB 2g/100 mL premix (0 g Intravenous Stopped 02/04/24 1614)                                    Medical Decision Making Patient presenting with significant pain and erythema of lower extremities, left greater than right, she has a history of chronic venous stasis dermatitis and a history of cellulitis, was admitted in August with similar symptoms at that time she was septic from this infection.  She denies nausea or vomiting, fevers but has had some chills today prior to arrival.  She has erythema of both lower extremities,  worse left posterior lower extremity.  Both posterior lower extremities have very shallow ulcerated lesions with drainage of serosanguineous fluid.  Amount and/or Complexity of Data Reviewed Labs: ordered.    Details: Labs reviewed, be met is significant for a creatinine of 1.55, her CBC is significant for WBC count of 11.0, hemoglobin of 10.6, her lactic acid is normal at 1.6, she has a UTI with large hemoglobin, leukocytes with few bacteria, this was a clean-catch. Discussion of management  or test interpretation with external provider(s): Pt discussed with Dr. Fredirick who will see and admit pt.  Risk Decision regarding hospitalization.        Final diagnoses:  Cellulitis of lower extremity, unspecified laterality    ED Discharge Orders     None          Birdena Mliss RIGGERS 02/04/24 1727    Suzette Pac, MD 02/06/24 1027

## 2024-02-04 NOTE — Assessment & Plan Note (Signed)
 Continue Synthroid.  Check TSH.

## 2024-02-04 NOTE — Hospital Course (Signed)
 Patient is a 68 year old with history of A-fib (on Eliquis ), HTN, HLD, hypothyroid and morbid obesity who presents with increased lower extremity pain and swelling over the last 1 week.  Since last 2 to 3 days its gotten significantly worse and she has had family over his look at her legs and so they have looked worse over the last day.  She has taken a lot of Celebrex related to pain.  She is recently hospitalized in August 2025 with similar complaints when she was found to be septic.  Following that hospitalization she was in rehab for a few weeks.  In the ED she is noted to have significant lower extremity swelling.  She had a mildly elevated white count at 11 and a mildly decreased hemoglobin at 10.6.  Patient was also noted to have hematuria.

## 2024-02-04 NOTE — Assessment & Plan Note (Signed)
 Rate controlled on no meds

## 2024-02-04 NOTE — H&P (Signed)
 History and Physical    Patient: Angela [name] FMW:[id number] DOB: 1955/12/18 DOA: 02/04/2024 DOS: the patient was seen and examined on 02/04/2024 PCP: Marvine Rush, MD  Patient coming from: Home where she lives with her 67 year old grandson  Chief Complaint:  Chief Complaint  Patient presents with   Cellulitis   HPI: Angela [name] is a 68 y.o. female with medical history significant of A-fib (on Eliquis ), HTN, HLD, hypothyroid and morbid obesity who presents with increased lower extremity pain and swelling over the last 1 week.  Since last 2 to 3 days its gotten significantly worse and she has had family over his look at her legs and so they have looked worse over the last day.  She has taken a lot of Celebrex related to pain.  She is recently hospitalized in August 2025 with similar complaints when she was found to be septic.  Following that hospitalization she was in rehab for a few weeks.  In the ED she is noted to have significant lower extremity swelling. She had a mildly elevated white count at 11 and a mildly decreased hemoglobin at 10.6.  Patient was also noted to have hematuria.  Review of Systems: As mentioned in the history of present illness. All other systems reviewed and are negative. Past Medical History:  Diagnosis Date   Arthritis    COVID-23 February 2020   Essential hypertension    Hypothyroidism    Mixed hyperlipidemia    Persistent atrial fibrillation (HCC)    Venous insufficiency    Past Surgical History:  Procedure Laterality Date   CARPAL TUNNEL RELEASE Right    CESAREAN SECTION     HERNIA REPAIR     Social History:  reports that she has never smoked. She has never used smokeless tobacco. She reports that she does not drink alcohol and does not use drugs.  Allergies  Allergen Reactions   Ibuprofen Other (See Comments)    Physician states it will affect her kidneys    Family History  Problem Relation Age of Onset   Cancer Maternal  Grandfather     Prior to Admission medications   Medication Sig Start Date End Date Taking? Authorizing Provider  acetaminophen  (TYLENOL ) 500 MG tablet Take 1,000 mg by mouth every 6 (six) hours as needed for headache.   Yes [provider]  Ascorbic Acid (VITAMIN C) 100 MG tablet Take 100 mg by mouth daily.   Yes [provider]  celecoxib (CELEBREX) 200 MG capsule Take 200 mg by mouth 2 (two) times daily.   Yes [provider]  cholecalciferol  (VITAMIN D3) 25 MCG (1000 UNIT) tablet Take 1,000 Units by mouth daily.   Yes [provider]  ELIQUIS  5 MG TABS tablet TAKE 1 TABLET BY MOUTH TWICE  DAILY 05/19/23  Yes Debera Jayson MATSU, MD  Flaxseed, Linseed, (FLAX SEED OIL PO) Take 1 capsule by mouth daily.   Yes [provider]  levothyroxine  (SYNTHROID ) 150 MCG tablet Take 150 mcg by mouth daily. 08/03/23  Yes [provider]  simvastatin  (ZOCOR ) 20 MG tablet Take 20 mg by mouth at bedtime. 09/10/19  Yes [provider]  torsemide  (DEMADEX ) 10 MG tablet Take 1 tablet (10 mg total) by mouth daily. 01/25/24  Yes Debera Jayson MATSU, MD  zolpidem  (AMBIEN ) 5 MG tablet Take 1 tablet (5 mg total) by mouth at bedtime as needed for sleep. 10/20/23  Yes Vicci Afton CROME, MD    Physical Exam: Vitals:  02/04/24 1600 02/04/24 1615 02/04/24 1620 02/04/24 1625  BP: 108/72     Pulse: 82 77 71 81  Resp: 17     Temp:      TempSrc:      SpO2:  100% 97% 100%  Height:       Physical Examination: General appearance - alert, well appearing, and in no distress Chest - clear to auscultation, no wheezes, rales or rhonchi, symmetric air entry Heart - irregularly irregular rhythm with rate 80 Abdomen -erythema at the panniculus Skin -marked edema, erythema -see photos   Media Information             Data Reviewed: Results for orders placed or performed during the hospital encounter of 02/04/24 (from the past 24 hours)  CBC with  Differential     Status: Abnormal   Collection Time: 02/04/24  2:23 PM  Result Value Ref Range   WBC 11.0 (H) 4.0 - 10.5 K/uL   RBC 3.89 3.87 - 5.11 MIL/uL   Hemoglobin 10.6 (L) 12.0 - 15.0 g/dL   HCT 65.6 (L) 63.9 - 53.9 %   MCV 88.2 80.0 - 100.0 fL   MCH 27.2 26.0 - 34.0 pg   MCHC 30.9 30.0 - 36.0 g/dL   RDW 84.7 88.4 - 84.4 %   Platelets 172 150 - 400 K/uL   nRBC 0.0 0.0 - 0.2 %   Neutrophils Relative % 82 %   Neutro Abs 9.2 (H) 1.7 - 7.7 K/uL   Lymphocytes Relative 9 %   Lymphs Abs 0.9 0.7 - 4.0 K/uL   Monocytes Relative 7 %   Monocytes Absolute 0.8 0.1 - 1.0 K/uL   Eosinophils Relative 1 %   Eosinophils Absolute 0.1 0.0 - 0.5 K/uL   Basophils Relative 0 %   Basophils Absolute 0.0 0.0 - 0.1 K/uL   Immature Granulocytes 1 %   Abs Immature Granulocytes 0.06 0.00 - 0.07 K/uL  Basic metabolic panel     Status: Abnormal   Collection Time: 02/04/24  2:23 PM  Result Value Ref Range   Sodium 140 135 - 145 mmol/L   Potassium 4.2 3.5 - 5.1 mmol/L   Chloride 108 98 - 111 mmol/L   CO2 20 (L) 22 - 32 mmol/L   Glucose, Bld 104 (H) 70 - 99 mg/dL   BUN 24 (H) 8 - 23 mg/dL   Creatinine, Ser 8.44 (H) 0.44 - 1.00 mg/dL   Calcium 8.9 8.9 - 89.6 mg/dL   GFR, Estimated 36 (L) >60 mL/min   Anion gap 12 5 - 15  Lactic acid, plasma     Status: None   Collection Time: 02/04/24  2:24 PM  Result Value Ref Range   Lactic Acid, Venous 1.4 0.5 - 1.9 mmol/L  Lactic acid, plasma     Status: None   Collection Time: 02/04/24  4:05 PM  Result Value Ref Range   Lactic Acid, Venous 1.6 0.5 - 1.9 mmol/L  Urinalysis, Routine w reflex microscopic -Urine, Clean Catch     Status: Abnormal   Collection Time: 02/04/24  4:05 PM  Result Value Ref Range   Color, Urine AMBER (A) YELLOW   APPearance HAZY (A) CLEAR   Specific Gravity, Urine 1.013 1.005 - 1.030   pH 7.0 5.0 - 8.0   Glucose, UA NEGATIVE NEGATIVE mg/dL   Hgb urine dipstick LARGE (A) NEGATIVE   Bilirubin Urine NEGATIVE NEGATIVE   Ketones, ur  NEGATIVE NEGATIVE mg/dL   Protein, ur 899 (A) NEGATIVE mg/dL  Nitrite NEGATIVE NEGATIVE   Leukocytes,Ua MODERATE (A) NEGATIVE   RBC / HPF >50 0 - 5 RBC/hpf   WBC, UA >50 0 - 5 WBC/hpf   Bacteria, UA FEW (A) NONE SEEN   Squamous Epithelial / HPF 0-5 0 - 5 /HPF   WBC Clumps PRESENT    Assessment and Plan: No notes have been filed under this hospital service. Service: Hospitalist     Advance Care Planning:   Code Status: Prior Full   Consults: None  Family Communication: Patient at bedside  Severity of Illness: The appropriate patient status for this patient is INPATIENT. Inpatient status is judged to be reasonable and necessary in order to provide the required intensity of service to ensure the patient's safety. The patient's presenting symptoms, physical exam findings, and initial radiographic and laboratory data in the context of their chronic comorbidities is felt to place them at high risk for further clinical deterioration. Furthermore, it is not anticipated that the patient will be medically stable for discharge from the hospital within 2 midnights of admission.   * I certify that at the point of admission it is my clinical judgment that the patient will require inpatient hospital care spanning beyond 2 midnights from the point of admission due to high intensity of service, high risk for further deterioration and high frequency of surveillance required.*  Author: Glenys GORMAN Birk, MD 02/04/2024 5:41 PM  For on call review [email or web address].

## 2024-02-04 NOTE — Assessment & Plan Note (Signed)
 Continue simvastatin .

## 2024-02-04 NOTE — Assessment & Plan Note (Signed)
 Patient with a very dirty urine. Antibiotics to treat UTI Check urine culture

## 2024-02-04 NOTE — ED Triage Notes (Signed)
 Pt BIB EMS. Pt stated, about a week ago pain became worse. Unable to walk and pain is 10/10.  Bilaterally lower extremities are red, tender, draining, and swollen.

## 2024-02-05 ENCOUNTER — Encounter (HOSPITAL_COMMUNITY): Payer: Self-pay | Admitting: Family Medicine

## 2024-02-05 LAB — CK: Total CK: 415 U/L — ABNORMAL HIGH (ref 38–234)

## 2024-02-05 MED ORDER — CEFAZOLIN SODIUM-DEXTROSE 2-4 GM/100ML-% IV SOLN
2.0000 g | Freq: Three times a day (TID) | INTRAVENOUS | Status: DC
  Administered 2024-02-05 – 2024-02-08 (×10): 2 g via INTRAVENOUS
  Filled 2024-02-05 (×9): qty 100

## 2024-02-05 MED ORDER — HYDROCODONE-ACETAMINOPHEN 5-325 MG PO TABS
1.0000 | ORAL_TABLET | Freq: Four times a day (QID) | ORAL | Status: DC | PRN
  Administered 2024-02-05 – 2024-02-07 (×2): 1 via ORAL
  Filled 2024-02-05 (×2): qty 1

## 2024-02-05 MED ORDER — VITAMIN C 500 MG PO TABS
250.0000 mg | ORAL_TABLET | Freq: Every day | ORAL | Status: DC
  Administered 2024-02-05 – 2024-02-08 (×4): 250 mg via ORAL
  Filled 2024-02-05 (×4): qty 1

## 2024-02-05 NOTE — Plan of Care (Signed)

## 2024-02-05 NOTE — Plan of Care (Signed)
  Problem: Health Behavior/Discharge Planning: Goal: Ability to manage health-related needs will improve Outcome: Progressing   Problem: Clinical Measurements: Goal: Ability to maintain clinical measurements within normal limits will improve Outcome: Progressing   Problem: Clinical Measurements: Goal: Will remain free from infection Outcome: Progressing   Problem: Activity: Goal: Risk for activity intolerance will decrease Outcome: Progressing   Problem: Pain Managment: Goal: General experience of comfort will improve and/or be controlled Outcome: Progressing   Problem: Clinical Measurements: Goal: Ability to avoid or minimize complications of infection will improve Outcome: Progressing   Problem: Skin Integrity: Goal: Skin integrity will improve Outcome: Progressing

## 2024-02-05 NOTE — Progress Notes (Signed)
 PROGRESS NOTE    Angela [name]  FMW:[id number] DOB: Nov 21, 1955 DOA: 02/04/2024 PCP: Marvine Rush, MD   Brief Narrative:    Angela [name] is a 68 y.o. female with medical history significant of A-fib (on Eliquis ), HTN, HLD, hypothyroid and morbid obesity who presents with increased lower extremity pain and swelling over the last 1 week.  Since last 2 to 3 days its gotten significantly worse and she has had family over his look at her legs and so they have looked worse over the last day.  She has taken a lot of Celebrex related to pain.  She is recently hospitalized in August 2025 with similar complaints when she was found to be septic.  Following that hospitalization she was in rehab for a few weeks.  In the ED she is noted to have significant lower extremity swelling, also with some erythema. She had a mildly elevated white count at 11 and a mildly decreased hemoglobin at 10.6.  Patient was also noted to have hematuria.   Patient initiated on IV cephalexin  and admitted for further management.  Assessment & Plan:   Principal Problem:   Lower extremity cellulitis Active Problems:   Atrial fibrillation (HCC)   Hyperlipidemia   Hypothyroid   Morbid obesity (HCC)   CKD stage 3b, GFR 30-44 ml/min (HCC)   UTI (urinary tract infection)   Normocytic anemia   Bilateral lower extremity edema Left lower extremity cellulitis Bilateral lower extremity venous stasis changes  - Will continue IV cephalexin , pain management, continue Tylenol , Norco added. -Wound care consultation. - Continue torsemide  10 mg daily  Microscopic hematuria, pyuria concerning for UTI: Patient reports increasing urinary frequency but takes torsemide .  Denies pain.  No fever or chills.  White count mildly elevated Continue antibiotics as above Follow-up on cultures  Hyperlipidemia-continue statin Hypothyroidism-continue levothyroxine   Atrial fibrillation Continue Eliquis .  Patient does not appear to be on  rate controlling agents     DVT prophylaxis: Eliquis  Code Status: Full Family Communication: None at the bedside Disposition Plan: Home Status is: Inpatient Remains inpatient appropriate because: Continue IV antibiotics, monitor wound, cultures  Subjective:  Patient seen and examined at the bedside.  Denies chest pain or shortness of breath.  Bilateral lower extremity edema noted.  Left lower extremity i.e. is erythematous more than on baseline.  Patient reports of some pain with wound care management.  Reports urinary frequency, no suprapubic pain.  Objective: Vitals:   02/05/24 0022 02/05/24 0438 02/05/24 0647 02/05/24 0844  BP: 124/68  131/71 (!) 110/57  Pulse: 84  94 83  Resp: 18  20 (!) 21  Temp: 98.6 F (37 C)  98.3 F (36.8 C) 97.7 F (36.5 C)  TempSrc: Oral  Oral Oral  SpO2: 100%  100% 99%  Weight:  (!) 168.7 kg    Height:        Intake/Output Summary (Last 24 hours) at 02/05/2024 1243 Last data filed at 02/05/2024 1051 Gross per 24 hour  Intake 904.89 ml  Output 2200 ml  Net -1295.11 ml   Filed Weights   02/05/24 0438  Weight: (!) 168.7 kg    Examination:  Physical Examination: General appearance - alert, well appearing, and in no distress Chest - clear to auscultation, no wheezes, rales or rhonchi, symmetric air entry Heart - irregularly irregular rhythm with rate 80 Abdomen -erythema at the panniculus Skin -marked edema, erythema -see photos   Media Information  Data Reviewed: I have personally reviewed following labs and imaging studies  CBC: Recent Labs  Lab 02/04/24 1423  WBC 11.0*  NEUTROABS 9.2*  HGB 10.6*  HCT 34.3*  MCV 88.2  PLT 172   Basic Metabolic Panel: Recent Labs  Lab 02/04/24 1423  NA 140  K 4.2  CL 108  CO2 20*  GLUCOSE 104*  BUN 24*  CREATININE 1.55*  CALCIUM 8.9   GFR: Estimated Creatinine Clearance: 61.2 mL/min (A) (by C-G formula based on SCr of 1.55 mg/dL (H)). Liver Function Tests: No  results for input(s): AST, ALT, ALKPHOS, BILITOT, PROT, ALBUMIN in the last 168 hours. No results for input(s): LIPASE, AMYLASE in the last 168 hours. No results for input(s): AMMONIA in the last 168 hours. Coagulation Profile: No results for input(s): INR, PROTIME in the last 168 hours. Cardiac Enzymes: Recent Labs  Lab 02/05/24 0842  CKTOTAL 415*   BNP (last 3 results) No results for input(s): PROBNP in the last 8760 hours. HbA1C: No results for input(s): HGBA1C in the last 72 hours. CBG: No results for input(s): GLUCAP in the last 168 hours. Lipid Profile: No results for input(s): CHOL, HDL, LDLCALC, TRIG, CHOLHDL, LDLDIRECT in the last 72 hours. Thyroid  Function Tests: Recent Labs    02/04/24 1424  TSH 2.330   Anemia Panel: Recent Labs    02/04/24 1527 02/04/24 1900  VITAMINB12 395  --   FOLATE 5.8*  --   FERRITIN 181  --   TIBC 245*  --   IRON <10*  --   RETICCTPCT  --  1.1   Sepsis Labs: Recent Labs  Lab 02/04/24 1424 02/04/24 1605  LATICACIDVEN 1.4 1.6    Recent Results (from the past 240 hours)  Blood culture (routine x 2)     Status: None (Preliminary result)   Collection Time: 02/04/24  3:28 PM   Specimen: BLOOD  Result Value Ref Range Status   Specimen Description BLOOD BLOOD RIGHT ARM  Final   Special Requests NONE  Final   Culture   Final    NO GROWTH < 24 HOURS Performed at [facility], [address] [place], [place] [address]    Report Status PENDING  Incomplete  Blood culture (routine x 2)     Status: None (Preliminary result)   Collection Time: 02/04/24  3:28 PM   Specimen: BLOOD  Result Value Ref Range Status   Specimen Description BLOOD BLOOD LEFT ARM  Final   Special Requests NONE  Final   Culture   Final    NO GROWTH < 24 HOURS Performed at [facility], [address], [place], [place] [address]    Report Status PENDING  Incomplete         Radiology Studies: No results  found.      Scheduled Meds:  apixaban   5 mg Oral BID   ascorbic acid  250 mg Oral Daily   cholecalciferol   1,000 Units Oral Daily   collagenase   Topical Daily   Gerhardt's butt cream   Topical BID   levothyroxine   150 mcg Oral Daily   nystatin    Topical TID   simvastatin   20 mg Oral QHS   Continuous Infusions:   ceFAZolin (ANCEF) IV     lactated ringers  40 mL/hr at 02/05/24 0451          Angela Schwegel, MD [facility] 02/05/2024, 12:43 PM

## 2024-02-05 NOTE — Progress Notes (Addendum)
 Angela [name] is active with [facility] Angela [name]/OT/RN. Will need resumption orders. Jennifer with [facility] notified of admission.   Update: Per Angela [name] is unable to take Angela [name] back due to not having active PCP. Angela [name] was unaware of this and states she has new patient appointment in January with Dr. Alphonsa. LCSW called and they do not have anything sooner available. [facility]'s first appointment isn't until March. Angela is working to see if Angela [name] can still be accepted back. Angela [name] also updated and said she would try to call a few PCP offices to see if she can be seen sooner. TOC will follow up.     02/05/24 1018  TOC Brief Assessment  Insurance and Status Reviewed  Patient has primary care physician Yes  Home environment has been reviewed Lives with grandson.  Prior level of function: Family assists.  Prior/Current Home Services Current home services [facility] Angela [name]/OT/RN.)  Social Drivers of Health Review SDOH reviewed no interventions necessary  Readmission risk has been reviewed Yes  Transition of care needs transition of care needs identified, TOC will continue to follow

## 2024-02-05 NOTE — Plan of Care (Signed)

## 2024-02-06 LAB — CBC WITH DIFFERENTIAL/PLATELET
Abs Immature Granulocytes: 0.02 K/uL (ref 0.00–0.07)
Basophils Absolute: 0 K/uL (ref 0.0–0.1)
Basophils Relative: 1 %
Eosinophils Absolute: 0.3 K/uL (ref 0.0–0.5)
Eosinophils Relative: 4 %
HCT: 32.2 % — ABNORMAL LOW (ref 36.0–46.0)
Hemoglobin: 9.7 g/dL — ABNORMAL LOW (ref 12.0–15.0)
Immature Granulocytes: 0 %
Lymphocytes Relative: 20 %
Lymphs Abs: 1.3 K/uL (ref 0.7–4.0)
MCH: 26.5 pg (ref 26.0–34.0)
MCHC: 30.1 g/dL (ref 30.0–36.0)
MCV: 88 fL (ref 80.0–100.0)
Monocytes Absolute: 0.8 K/uL (ref 0.1–1.0)
Monocytes Relative: 12 %
Neutro Abs: 4.1 K/uL (ref 1.7–7.7)
Neutrophils Relative %: 63 %
Platelets: 178 K/uL (ref 150–400)
RBC: 3.66 MIL/uL — ABNORMAL LOW (ref 3.87–5.11)
RDW: 15.2 % (ref 11.5–15.5)
WBC: 6.6 K/uL (ref 4.0–10.5)
nRBC: 0 % (ref 0.0–0.2)

## 2024-02-06 LAB — BASIC METABOLIC PANEL WITH GFR
Anion gap: 7 (ref 5–15)
BUN: 19 mg/dL (ref 8–23)
CO2: 28 mmol/L (ref 22–32)
Calcium: 8.7 mg/dL — ABNORMAL LOW (ref 8.9–10.3)
Chloride: 110 mmol/L (ref 98–111)
Creatinine, Ser: 1.37 mg/dL — ABNORMAL HIGH (ref 0.44–1.00)
GFR, Estimated: 42 mL/min — ABNORMAL LOW (ref >60)
Glucose, Bld: 104 mg/dL — ABNORMAL HIGH (ref 70–99)
Potassium: 4.1 mmol/L (ref 3.5–5.1)
Sodium: 144 mmol/L (ref 135–145)

## 2024-02-06 MED ORDER — FERROUS SULFATE 325 (65 FE) MG PO TABS
325.0000 mg | ORAL_TABLET | Freq: Every day | ORAL | Status: DC
  Administered 2024-02-07 – 2024-02-08 (×2): 325 mg via ORAL
  Filled 2024-02-06 (×2): qty 1

## 2024-02-06 MED ORDER — FOLIC ACID 1 MG PO TABS
1.0000 mg | ORAL_TABLET | Freq: Every day | ORAL | Status: DC
  Administered 2024-02-06 – 2024-02-08 (×3): 1 mg via ORAL
  Filled 2024-02-06 (×3): qty 1

## 2024-02-06 NOTE — Plan of Care (Signed)
  Problem: Acute Rehab PT Goals(only PT should resolve) Goal: Pt Will Go Supine/Side To Sit Outcome: Progressing Flowsheets (Taken 02/06/2024 1612) Pt will go Supine/Side to Sit: with minimal assist Goal: Patient Will Transfer Sit To/From Stand Outcome: Progressing Flowsheets (Taken 02/06/2024 1612) Patient will transfer sit to/from stand: with contact guard assist Goal: Pt Will Transfer Bed To Chair/Chair To Bed Outcome: Progressing Flowsheets (Taken 02/06/2024 1612) Pt will Transfer Bed to Chair/Chair to Bed: with supervision Goal: Pt Will Ambulate Outcome: Progressing Flowsheets (Taken 02/06/2024 1612) Pt will Ambulate:  15 feet  with rolling walker  with supervision    4:13 PM, 02/06/24 Rosaria Settler, PT, DPT [place] with [facility]

## 2024-02-06 NOTE — Evaluation (Signed)
 Occupational Therapy Evaluation Patient Details Name: Angela [name] MRN: [id number] DOB: [birth year] Today's Date: 02/06/2024   History of Present Illness   CORTNEY BEISSEL is a 68 y.o. female with medical history significant of A-fib (on Eliquis ), HTN, HLD, hypothyroid and morbid obesity who presents with increased lower extremity pain and swelling over the last 1 week.  Since last 2 to 3 days its gotten significantly worse and she has had family over his look at her legs and so they have looked worse over the last day.  She has taken a lot of Celebrex related to pain.  She is recently hospitalized in August 2025 with similar complaints when she was found to be septic.  Following that hospitalization she was in rehab for a few weeks.  In the ED she is noted to have significant lower extremity swelling. She had a mildly elevated white count at 11 and a mildly decreased hemoglobin at 10.6.  Patient was also noted to have hematuria.     Clinical Impressions Pt agreeable to OT and PT co-evaluation. Pt lives with grandson at baseline and uses RW. Pt required mod to max A for bed mobility with much time and labored movement. Pt required min to mod A for sit to stand and a couple steps in the room with the RW. Pt demonstrates very slow and labored movement. Pt does not wear sock at baseline but likely would need mod to max A for thorough B LE bathing and dressing of pants or something similar. Pt left in the bed with call bell within reach. Pt will benefit from continued OT in the hospital to increase strength, balance, and endurance for safe ADL's.        If plan is discharge home, recommend the following:   A lot of help with walking and/or transfers;A lot of help with bathing/dressing/bathroom;Assistance with cooking/housework;Assist for transportation;Help with stairs or ramp for entrance     Functional Status Assessment   Patient has had a recent decline in their functional status and  demonstrates the ability to make significant improvements in function in a reasonable and predictable amount of time.     Equipment Recommendations   None recommended by OT             Precautions/Restrictions   Precautions Precautions: Fall Recall of Precautions/Restrictions: Intact Restrictions Weight Bearing Restrictions Per Provider Order: No     Mobility Bed Mobility Overal bed mobility: Needs Assistance Bed Mobility: Supine to Sit, Sit to Supine     Supine to sit: Mod assist Sit to supine: Max assist   General bed mobility comments: Supine to sit with assist to move  BLE to EOB and single UE assist to pull to sit. Assist also to scoot to EOB. Much assist to return to supine for torso and B LE.    Transfers Overall transfer level: Needs assistance Equipment used: Rolling walker (2 wheels) Transfers: Sit to/from Stand Sit to Stand: Mod assist, Min assist           General transfer comment: Sit to stand from EOB with bed raised slightly.      Balance Overall balance assessment: Needs assistance Sitting-balance support: Feet supported, Bilateral upper extremity supported Sitting balance-Leahy Scale: Fair Sitting balance - Comments: seated EOB, posterior lean at times   Standing balance support: During functional activity, Bilateral upper extremity supported, Reliant on assistive device for balance Standing balance-Leahy Scale: Poor Standing balance comment: w/ RW  ADL either performed or assessed with clinical judgement   ADL Overall ADL's : Needs assistance/impaired     Grooming: Set up;Contact guard assist;Sitting   Upper Body Bathing: Set up;Contact guard assist;Sitting   Lower Body Bathing: Maximal assistance;Sitting/lateral leans;Moderate assistance   Upper Body Dressing : Set up;Sitting;Contact guard assist   Lower Body Dressing: Moderate assistance;Maximal assistance;Sitting/lateral leans Lower Body  Dressing Details (indicate cue type and reason): Pt does not wear sock at baseline but uses sliding shoes; assist needed to start shoes and hold them in place. Toilet Transfer: Minimal assistance;Moderate assistance;Ambulation;Rolling walker (2 wheels) Toilet Transfer Details (indicate cue type and reason): Simulated via sit to stand and a couple steps from bed with RW. Toileting- Clothing Manipulation and Hygiene: Maximal assistance;Moderate assistance;Sitting/lateral lean;Sit to/from stand       Functional mobility during ADLs: Minimal assistance;Moderate assistance;Rolling walker (2 wheels) General ADL Comments: Pt able to take 2 steps forward and backward with RW; slow/labored movement.     Vision Baseline Vision/History: 1 Wears glasses Ability to See in Adequate Light: 1 Impaired Patient Visual Report: No change from baseline Vision Assessment?: No apparent visual deficits     Perception Perception: Not tested       Praxis Praxis: Not tested       Pertinent Vitals/Pain Pain Assessment Pain Assessment: 0-10 Pain Score: 7  Pain Location: BLE Pain Descriptors / Indicators: Sharp Pain Intervention(s): Limited activity within patient's tolerance, Monitored during session, Repositioned     Extremity/Trunk Assessment Upper Extremity Assessment Upper Extremity Assessment: Generalized weakness   Lower Extremity Assessment Lower Extremity Assessment: Defer to PT evaluation   Cervical / Trunk Assessment Cervical / Trunk Assessment: Kyphotic   Communication Communication Communication: No apparent difficulties   Cognition Arousal: Alert Behavior During Therapy: WFL for tasks assessed/performed Cognition: No apparent impairments                               Following commands: Intact       Cueing  General Comments   Cueing Techniques: Verbal cues;Tactile cues;Visual cues  Pt did report dizziness when first sitting up at EOB.              Home  Living Family/patient expects to be discharged to:: Private residence Living Arrangements: Other relatives Amedeo) Available Help at Discharge: Family;Available PRN/intermittently Type of Home: House Home Access: Level entry     Home Layout: Two level;Able to live on main level with bedroom/bathroom Alternate Level Stairs-Number of Steps: Able to live on main level   Bathroom Shower/Tub: Chief Strategy Officer: Handicapped height Bathroom Accessibility: Yes How Accessible: Accessible via wheelchair;Accessible via walker Home Equipment: Rolling Walker (2 wheels);Cane - quad;Grab bars - tub/shower   Additional Comments: Pt reports pt's grandons likely would not provide the needed level of support at home.      Prior Functioning/Environment Prior Level of Function : Needs assist       Physical Assist : Mobility (physical) Mobility (physical): Gait;Transfers   Mobility Comments: Ambulation with RW at home. Pt reports she can go outside but has needed supervision in the past. ADLs Comments: Independent ADLs, but reportedly difficult. Assisted IADL's from grandson    OT Problem List: Decreased strength;Decreased activity tolerance;Impaired balance (sitting and/or standing);Obesity;Pain   OT Treatment/Interventions: Self-care/ADL training;Therapeutic exercise;Therapeutic activities;Balance training;Patient/family education;DME and/or AE instruction;Energy conservation      OT Goals(Current goals can be found in the care plan section)  Acute Rehab OT Goals Patient Stated Goal: improve function OT Goal Formulation: With patient Time For Goal Achievement: 02/20/24 Potential to Achieve Goals: Good   OT Frequency:  Min 2X/week    Co-evaluation PT/OT/SLP Co-Evaluation/Treatment: Yes Reason for Co-Treatment: To address functional/ADL transfers PT goals addressed during session: Mobility/safety with mobility OT goals addressed during session: ADL's and self-care                        End of Session Equipment Utilized During Treatment: Rolling walker (2 wheels);Gait belt Nurse Communication: Mobility status  Activity Tolerance: Patient tolerated treatment well Patient left: in bed;with call bell/phone within reach  OT Visit Diagnosis: Unsteadiness on feet (R26.81);Other abnormalities of gait and mobility (R26.89);Muscle weakness (generalized) (M62.81)                Time: 8574-8547 OT Time Calculation (min): 27 min Charges:  OT General Charges $OT Visit: 1 Visit OT Evaluation $OT Eval Low Complexity: 1 Low  Mandeep Ferch OT, MOT   Jayson Person 02/06/2024, 4:13 PM

## 2024-02-06 NOTE — Plan of Care (Signed)

## 2024-02-06 NOTE — Plan of Care (Signed)
  Problem: Acute Rehab OT Goals (only OT should resolve) Goal: Pt. Will Perform Grooming Flowsheets (Taken 02/06/2024 1615) Pt Will Perform Grooming:  with modified independence  standing Goal: Pt. Will Perform Lower Body Bathing Flowsheets (Taken 02/06/2024 1615) Pt Will Perform Lower Body Bathing:  with modified independence  sitting/lateral leans  with adaptive equipment Goal: Pt. Will Perform Lower Body Dressing Flowsheets (Taken 02/06/2024 1615) Pt Will Perform Lower Body Dressing:  with modified independence  with adaptive equipment  sitting/lateral leans Goal: Pt. Will Transfer To Toilet Flowsheets (Taken 02/06/2024 1615) Pt Will Transfer to Toilet:  with modified independence  ambulating Goal: Pt. Will Perform Toileting-Clothing Manipulation Flowsheets (Taken 02/06/2024 1615) Pt Will Perform Toileting - Clothing Manipulation and hygiene:  with modified independence  sitting/lateral leans Goal: Pt/Caregiver Will Perform Home Exercise Program Flowsheets (Taken 02/06/2024 1615) Pt/caregiver will Perform Home Exercise Program:  Increased strength  Both right and left upper extremity  Independently  Blake Goya OT, MOT

## 2024-02-06 NOTE — Progress Notes (Addendum)
 PROGRESS NOTE    Angela [name]  FMW:[id number] DOB: 04-Jan-1956 DOA: 02/04/2024 PCP: Marvine Rush, MD   Brief Narrative:    Angela [name] is a 68 y.o. female with medical history significant of A-fib (on Eliquis ), HTN, HLD, hypothyroid and morbid obesity who presents with increased lower extremity pain and swelling over the last 1 week.  Since last 2 to 3 days its gotten significantly worse and she has had family over his look at her legs and so they have looked worse over the last day.  She has taken a lot of Celebrex related to pain.  She is recently hospitalized in August 2025 with similar complaints when she was found to be septic.  Following that hospitalization she was in rehab for a few weeks.  In the ED she is noted to have significant lower extremity swelling, also with some erythema. She had a mildly elevated white count at 11 and a mildly decreased hemoglobin at 10.6.  Patient was also noted to have hematuria.   Patient initiated on IV cephalexin  and admitted for further management. Clinically improving, considering switch to oral antibiotics in a day or 2 PT/OT evaluation pending, considering SNF  Assessment & Plan:   Principal Problem:   Lower extremity cellulitis Active Problems:   Atrial fibrillation (HCC)   Hyperlipidemia   Hypothyroid   Morbid obesity (HCC)   CKD stage 3b, GFR 30-44 ml/min (HCC)   UTI (urinary tract infection)   Normocytic anemia   Bilateral lower extremity edema Left lower extremity cellulitis Bilateral lower extremity venous stasis changes  - Will continue IV cephalexin , pain management, continue Tylenol , Norco added. -Anticipate switch to p.o. antibiotics tomorrow - Local wound care - Continue torsemide  10 mg daily  Microscopic hematuria, pyuria concerning for UTI: Patient reports increasing urinary frequency but takes torsemide .  Denies pain.  No fever or chills.  White count mildly elevated Continue antibiotics as above Follow-up  on cultures CK mildly elevated, monitor, recheck in the a.m. Continue diuretics as above Kidney function is at baseline creatinine  EKG: Creatinine at baseline 1.37.  Hyperlipidemia-hold statin due to mildly elevated CK.    Hypothyroidism-continue levothyroxine   Atrial fibrillation Continue Eliquis .  Patient does not appear to be on rate controlling agents     DVT prophylaxis: Eliquis  Code Status: Full Family Communication: None at the bedside Disposition Plan: Patient from home, she will need SNF PT and OT evaluation pending   status is: Inpatient Remains inpatient appropriate because: Continue IV antibiotics, monitor wound, cultures  Subjective:  Patient seen and examined at the bedside.  Denies chest pain or shortness of breath.  Bilateral lower extremity edema persistent, erythema on the left lower extremity has improved.  Patient denies fever or chills.  Feels weak  Objective: Vitals:   02/05/24 0844 02/05/24 1303 02/05/24 1934 02/06/24 0509  BP: (!) 110/57 (!) 116/53 131/89 (!) 149/74  Pulse: 83 82 75 87  Resp: (!) 21 19 18 16   Temp: 97.7 F (36.5 C) 98.2 F (36.8 C) 98 F (36.7 C) 97.8 F (36.6 C)  TempSrc: Oral  Oral   SpO2: 99% 97% 97% 100%  Weight:      Height:        Intake/Output Summary (Last 24 hours) at 02/06/2024 1231 Last data filed at 02/06/2024 0920 Gross per 24 hour  Intake 722.98 ml  Output 1400 ml  Net -677.02 ml   Filed Weights   02/05/24 0438  Weight: (!) 168.7 kg    Examination:  Dental: Alert, oriented not in any acute distress HEENT: Moist oral mucosa Chest: Clear CVS: S1, S2, no murmur, regular rhythm Abdomen: Soft, nontender, obese Extremities: Bilateral lower extremity edema, lymphedema, erythema left lower extremity has improved.  Data Reviewed: I have personally reviewed following labs and imaging studies  CBC: Recent Labs  Lab 02/04/24 1423 02/06/24 0418  WBC 11.0* 6.6  NEUTROABS 9.2* 4.1  HGB 10.6* 9.7*  HCT  34.3* 32.2*  MCV 88.2 88.0  PLT 172 178   Basic Metabolic Panel: Recent Labs  Lab 02/04/24 1423 02/06/24 0418  NA 140 144  K 4.2 4.1  CL 108 110  CO2 20* 28  GLUCOSE 104* 104*  BUN 24* 19  CREATININE 1.55* 1.37*  CALCIUM 8.9 8.7*   GFR: Estimated Creatinine Clearance: 69.2 mL/min (A) (by C-G formula based on SCr of 1.37 mg/dL (H)). Liver Function Tests: No results for input(s): AST, ALT, ALKPHOS, BILITOT, PROT, ALBUMIN in the last 168 hours. No results for input(s): LIPASE, AMYLASE in the last 168 hours. No results for input(s): AMMONIA in the last 168 hours. Coagulation Profile: No results for input(s): INR, PROTIME in the last 168 hours. Cardiac Enzymes: Recent Labs  Lab 02/05/24 0842  CKTOTAL 415*   BNP (last 3 results) No results for input(s): PROBNP in the last 8760 hours. HbA1C: No results for input(s): HGBA1C in the last 72 hours. CBG: No results for input(s): GLUCAP in the last 168 hours. Lipid Profile: No results for input(s): CHOL, HDL, LDLCALC, TRIG, CHOLHDL, LDLDIRECT in the last 72 hours. Thyroid  Function Tests: Recent Labs    02/04/24 1424  TSH 2.330   Anemia Panel: Recent Labs    02/04/24 1527 02/04/24 1900  VITAMINB12 395  --   FOLATE 5.8*  --   FERRITIN 181  --   TIBC 245*  --   IRON <10*  --   RETICCTPCT  --  1.1   Sepsis Labs: Recent Labs  Lab 02/04/24 1424 02/04/24 1605  LATICACIDVEN 1.4 1.6    Recent Results (from the past 240 hours)  Blood culture (routine x 2)     Status: None (Preliminary result)   Collection Time: 02/04/24  3:28 PM   Specimen: BLOOD  Result Value Ref Range Status   Specimen Description   Final    BLOOD BLOOD RIGHT ARM Performed at [facility], [address] [place], [place] [address]    Special Requests   Final    NONE Performed at [facility], [address], [place], [place] [address]    Culture  Setup Time   Final    ANAEROBIC BOTTLE ONLY GRAM  POSITIVE RODS Gram Stain Report Called to,Read Back By and Verified With: K DILLARD AT 0852 ON 12.02.25 BY ADGER J GRAM STAIN REVIEWED-AGREE WITH RESULT DRT Performed at [facility], [address], [place], [place] [address]    Culture GRAM POSITIVE RODS  Final   Report Status PENDING  Incomplete  Blood culture (routine x 2)     Status: None (Preliminary result)   Collection Time: 02/04/24  3:28 PM   Specimen: BLOOD  Result Value Ref Range Status   Specimen Description BLOOD BLOOD LEFT ARM  Final   Special Requests NONE  Final   Culture   Final    NO GROWTH 2 DAYS Performed at [facility], [address] [place], [place] [address]    Report Status PENDING  Incomplete  Urine Culture     Status: Abnormal (Preliminary result)  Collection Time: 02/04/24  4:05 PM   Specimen: Urine, Clean Catch  Result Value Ref Range Status   Specimen Description   Final    URINE, CLEAN CATCH Performed at [facility], [address], [place], [place] [address]    Special Requests   Final    NONE Performed at [facility], [address] [place], [place] [address]    Culture (A)  Final    20,000 COLONIES/mL PROTEUS MIRABILIS SUSCEPTIBILITIES TO FOLLOW Performed at [facility], [address] [place], [place] [address]    Report Status PENDING  Incomplete         Radiology Studies: No results found.      Scheduled Meds:  apixaban   5 mg Oral BID   ascorbic acid  250 mg Oral Daily   cholecalciferol   1,000 Units Oral Daily   collagenase   Topical Daily   [START ON 02/07/2024] ferrous sulfate  325 mg Oral Q breakfast   folic acid   1 mg Oral Daily   Gerhardt's butt cream   Topical BID   levothyroxine   150 mcg Oral Daily   nystatin    Topical TID   simvastatin   20 mg Oral QHS   Continuous Infusions:   ceFAZolin (ANCEF) IV 2 g (02/06/24 0545)          Derryl Duval, MD [facility] 02/06/2024, 12:31 PM

## 2024-02-06 NOTE — TOC Progression Note (Signed)
 Transition of Care [facility]) - Progression Note    Patient Details  Name: Angela [name] MRN: [id number] Date of Birth: 06-04-1955  [facility] CM/SW Contact  Hoy DELENA Bigness, LCSW Phone Number: 02/06/2024, 10:29 AM  Clinical Narrative:    Referral sent to [employer] for assistance with applying for LTC Medicaid.      Barriers to Discharge: Continued Medical Work up               Expected Discharge Plan and Services                                               Social Drivers of Health (SDOH) Interventions SDOH Screenings   Food Insecurity: No Food Insecurity (02/05/2024)  Housing: Low Risk  (02/05/2024)  Transportation Needs: No Transportation Needs (02/05/2024)  Utilities: Not At Risk (02/05/2024)  Social Connections: Socially Isolated (02/05/2024)  Tobacco Use: Low Risk  (02/05/2024)    Readmission Risk Interventions    10/15/2023    5:32 PM  Readmission Risk Prevention Plan  Transportation Screening Complete  PCP or Specialist Appt within 5-7 Days Complete  Home Care Screening Complete  Medication Review (RN CM) Complete

## 2024-02-06 NOTE — Evaluation (Signed)
 Physical Therapy Evaluation Patient Details Name: Angela [name] MRN: [id number] DOB: Dec 16, 1955 Today's Date: 02/06/2024  History of Present Illness  Angela [name] is a 68 y.o. female with medical history significant of A-fib (on Eliquis ), HTN, HLD, hypothyroid and morbid obesity who presents with increased lower extremity pain and swelling over the last 1 week.  Since last 2 to 3 days its gotten significantly worse and she has had family over his look at her legs and so they have looked worse over the last day.  She has taken a lot of Celebrex related to pain.  She is recently hospitalized in August 2025 with similar complaints when she was found to be septic.  Following that hospitalization she was in rehab for a few weeks.  In the ED she is noted to have significant lower extremity swelling. She had a mildly elevated white count at 11 and a mildly decreased hemoglobin at 10.6.  Patient was also noted to have hematuria.   Clinical Impression  Patient agreeable to PT/OT co-evaluation. Patient reports since returning home from rehab, she was able to ambulate in home with RW, and at least modified independent with ADLs but more difficulty now due to LE pain. This date, patient requires moderate/max assistance with bed mobility, min/mod for transfer and CGA with RW during ambulation. Pt limited most due to pain, and decreased endurance. Pt reports having grandson at home to assist but is not sure he will be able to provide amount of assist needed at this time. Pt returns to bed at end of session, all needs met, call button in reach and nursing staff preset. Patient will benefit from continued skilled physical therapy acutely and in recommended venue in order to address current deficits and improve independence/overall function.        If plan is discharge home, recommend the following: Assist for transportation;Help with stairs or ramp for entrance;A lot of help with walking and/or transfers;A lot  of help with bathing/dressing/bathroom   Can travel by private vehicle        Equipment Recommendations None recommended by PT  Recommendations for Other Services       Functional Status Assessment Patient has had a recent decline in their functional status and demonstrates the ability to make significant improvements in function in a reasonable and predictable amount of time.     Precautions / Restrictions Precautions Precautions: Fall Recall of Precautions/Restrictions: Intact Restrictions Weight Bearing Restrictions Per Provider Order: No      Mobility  Bed Mobility Overal bed mobility: Needs Assistance Bed Mobility: Supine to Sit, Sit to Supine     Supine to sit: Mod assist, +2 for safety/equipment Sit to supine: Max assist, +2 for safety/equipment   General bed mobility comments: mod assist for supine to sit as pt is able to initiate movement with slow labored movement but requires assist to complete, trunk elevation from [facility] via pull to sit, therapist uses pad on bed to help scoot bottom to EOB to complete sit    Transfers Overall transfer level: Needs assistance Equipment used: Rolling walker (2 wheels) Transfers: Sit to/from Stand Sit to Stand: Mod assist, Min assist           General transfer comment: min/mod for STS from elevated bed height with RW, therapists blocking at pt foot and knees for safety, block RW to ensure safety as well as pt tends to lean heavily on RW    Ambulation/Gait Ambulation/Gait assistance: Contact guard assist, Min assist Gait  Distance (Feet): 5 Feet Assistive device: Rolling walker (2 wheels) Gait Pattern/deviations: Step-to pattern, Decreased step length - right, Decreased step length - left, Decreased stride length, Trunk flexed Gait velocity: Dec     General Gait Details: pt limited to a few forward/backward/side steps in room with RW, CGA/min A for safety, pt leaning onto RW throughout indicating increased fatigue, little  foot clearance throughout of note as well,  Stairs            Wheelchair Mobility     Tilt Bed    Modified Rankin (Stroke Patients Only)       Balance Overall balance assessment: Needs assistance Sitting-balance support: Feet supported, Bilateral upper extremity supported Sitting balance-Leahy Scale: Fair Sitting balance - Comments: seated EOB, posterior lean at times   Standing balance support: During functional activity, Bilateral upper extremity supported, Reliant on assistive device for balance Standing balance-Leahy Scale: Poor Standing balance comment: w/ RW                             Pertinent Vitals/Pain Pain Assessment Pain Assessment: 0-10 Pain Score: 7  Pain Location: BLE Pain Descriptors / Indicators: Constant Pain Intervention(s): Limited activity within patient's tolerance, Repositioned, Monitored during session    Home Living Family/patient expects to be discharged to:: Private residence Living Arrangements: Other relatives Available Help at Discharge: Family;Available PRN/intermittently Type of Home: House Home Access: Level entry     Alternate Level Stairs-Number of Steps: Able to live on main level Home Layout: Two level;Able to live on main level with bedroom/bathroom Home Equipment: Rolling Walker (2 wheels);Cane - quad;Grab bars - tub/shower Additional Comments: Pt reports no change in home set up since last admission, Reports grandson lives with her, may not be able to provide necessary assist needed at this time    Prior Function Prior Level of Function : Needs assist       Physical Assist : Mobility (physical) Mobility (physical): Gait;Transfers   Mobility Comments: Reports since discharge from rehab, short distance ambulation using RW in home, reports no falls ADLs Comments: Independent ADLs, but reportedly difficult. Assisted IADL's from grandson     Extremity/Trunk Assessment   Upper Extremity Assessment Upper  Extremity Assessment: Defer to OT evaluation    Lower Extremity Assessment Lower Extremity Assessment: Generalized weakness (pt demo difficulty with active LE movement against gravity while supine in bed requiring min assist at times, pt generally weak throughout)    Cervical / Trunk Assessment Cervical / Trunk Assessment: Kyphotic  Communication   Communication Communication: No apparent difficulties    Cognition Arousal: Alert Behavior During Therapy: WFL for tasks assessed/performed   PT - Cognitive impairments: No apparent impairments                         Following commands: Intact       Cueing Cueing Techniques: Verbal cues, Tactile cues, Visual cues     General Comments      Exercises     Assessment/Plan    PT Assessment Patient needs continued PT services;All further PT needs can be met in the next venue of care  PT Problem List Decreased strength;Decreased range of motion;Decreased activity tolerance;Decreased balance;Decreased mobility;Pain;Obesity       PT Treatment Interventions DME instruction;Balance training;Gait training;Functional mobility training;Therapeutic activities;Therapeutic exercise    PT Goals (Current goals can be found in the Care Plan section)  Acute Rehab PT Goals Patient  Stated Goal: Return home following short rehab stay PT Goal Formulation: With patient Time For Goal Achievement: 02/20/24 Potential to Achieve Goals: Good    Frequency Min 3X/week     Co-evaluation PT/OT/SLP Co-Evaluation/Treatment: Yes Reason for Co-Treatment: To address functional/ADL transfers PT goals addressed during session: Mobility/safety with mobility         AM-PAC PT 6 Clicks Mobility  Outcome Measure Help needed turning from your back to your side while in a flat bed without using bedrails?: A Lot Help needed moving from lying on your back to sitting on the side of a flat bed without using bedrails?: A Lot Help needed moving to  and from a bed to a chair (including a wheelchair)?: A Lot Help needed standing up from a chair using your arms (e.g., wheelchair or bedside chair)?: A Lot Help needed to walk in hospital room?: A Lot Help needed climbing 3-5 steps with a railing? : Total 6 Click Score: 11    End of Session Equipment Utilized During Treatment: Gait belt Activity Tolerance: Patient tolerated treatment well;Patient limited by fatigue;Patient limited by pain Patient left: in bed;with call bell/phone within reach;with nursing/sitter in room Nurse Communication: Mobility status PT Visit Diagnosis: Unsteadiness on feet (R26.81);Other abnormalities of gait and mobility (R26.89);Muscle weakness (generalized) (M62.81);Pain;Difficulty in walking, not elsewhere classified (R26.2) Pain - Right/Left:  (both) Pain - part of body: Leg;Ankle and joints of foot    Time: 8571-8548 PT Time Calculation (min) (ACUTE ONLY): 23 min   Charges:   PT Evaluation $PT Eval Moderate Complexity: 1 Mod   PT General Charges $$ ACUTE PT VISIT: 1 Visit         4:10 PM, 02/06/24 Zaynab Chipman Powell-Butler, PT, DPT [place] with [facility]

## 2024-02-07 DIAGNOSIS — L03119 Cellulitis of unspecified part of limb: Secondary | ICD-10-CM

## 2024-02-07 LAB — BASIC METABOLIC PANEL WITH GFR
Anion gap: 7 (ref 5–15)
BUN: 15 mg/dL (ref 8–23)
CO2: 29 mmol/L (ref 22–32)
Calcium: 8.6 mg/dL — ABNORMAL LOW (ref 8.9–10.3)
Chloride: 109 mmol/L (ref 98–111)
Creatinine, Ser: 1.35 mg/dL — ABNORMAL HIGH (ref 0.44–1.00)
GFR, Estimated: 43 mL/min — ABNORMAL LOW (ref >60)
Glucose, Bld: 98 mg/dL (ref 70–99)
Potassium: 4.1 mmol/L (ref 3.5–5.1)
Sodium: 144 mmol/L (ref 135–145)

## 2024-02-07 LAB — URINE CULTURE: Culture: 20000 — AB

## 2024-02-07 NOTE — [place] FL2 (Signed)
 [place]  MEDICAID FL2 LEVEL OF CARE FORM     IDENTIFICATION  Patient Name: Angela [name] Birthdate: March 19, 1955 Sex: female Admission Date (Current Location): 02/04/2024  [facility] [employer] and [place] Number:  [employer] and Address:  [facility],  [address] [place] [address]      Provider Number: [id number]  Attending Physician Name and Address:  Dino Antu, MD  Relative Name and Phone Number:       Current Level of Care: Hospital Recommended Level of Care: Skilled Nursing Facility Prior Approval Number:    Date Approved/Denied:   PASRR Number: [id number]  Discharge Plan: SNF    Current Diagnoses: Patient Active Problem List   Diagnosis Date Noted   UTI (urinary tract infection) 02/04/2024   Lower extremity cellulitis 02/04/2024   Normocytic anemia 02/04/2024   Hypoalbuminemia 02/10/2020   CKD stage 3b, GFR 30-44 ml/min (HCC) 02/10/2020   Candidal intertrigo 02/10/2020   Hyperlipidemia 06/06/2016   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   Hypothyroid 06/06/2016   Atrial fibrillation (HCC) 06/06/2016   Morbid obesity (HCC) 06/06/2016   Chronic lymphocytic thyroiditis 06/06/2016   Localized adiposity 10/30/2013   Ventral hernia without obstruction or gangrene 10/30/2013    Orientation RESPIRATION BLADDER Height & Weight     Self, Time, Situation, Place  Normal Incontinent Weight: (!) 372 lb (168.7 kg) Height:  5' 11 (180.3 cm)  BEHAVIORAL SYMPTOMS/MOOD NEUROLOGICAL BOWEL NUTRITION STATUS      Continent Diet (See DC summary)  AMBULATORY STATUS COMMUNICATION OF NEEDS Skin   Limited Assist Verbally PU Stage and Appropriate Care PU Stage 1 Dressing: Daily                     Personal Care Assistance Level of Assistance  Bathing, Feeding, Dressing Bathing Assistance: Maximum assistance Feeding assistance: Limited assistance Dressing Assistance: Maximum assistance     Functional Limitations Info  Sight, Hearing, Speech Sight  Info: Impaired (eyeglasses) Hearing Info: Adequate Speech Info: Adequate    SPECIAL CARE FACTORS FREQUENCY  PT (By licensed PT), OT (By licensed OT)     PT Frequency: 5x/wk OT Frequency: 5x/wk            Contractures Contractures Info: Not present    Additional Factors Info  Code Status, Allergies Code Status Info: FULL Allergies Info: Ibuprofen           Current Medications (02/07/2024):  This is the current hospital active medication list Current Facility-Administered Medications  Medication Dose Route Frequency Provider Last Rate Last Admin   acetaminophen  (TYLENOL ) tablet 650 mg  650 mg Oral Q6H PRN Pratt, Tanya S, MD   650 mg at 02/06/24 2027   Or   acetaminophen  (TYLENOL ) suppository 650 mg  650 mg Rectal Q6H PRN Pratt, Tanya S, MD       acetaminophen  (TYLENOL ) tablet 1,000 mg  1,000 mg Oral Q6H PRN Fredirick Glenys RAMAN, MD       apixaban  (ELIQUIS ) tablet 5 mg  5 mg Oral BID Pratt, Tanya S, MD   5 mg at 02/07/24 0902   ascorbic acid  (VITAMIN C ) tablet 250 mg  250 mg Oral Daily Sigdel, Santosh, MD   250 mg at 02/07/24 0902   ceFAZolin  (ANCEF ) IVPB 2g/100 mL premix  2 g Intravenous Q8H Tanda Dempsey SAUNDERS, RPH 200 mL/hr at 02/07/24 0551 2 g at 02/07/24 0551   cholecalciferol  (VITAMIN D3) 25 MCG (1000 UNIT) tablet 1,000 Units  1,000 Units Oral Daily Fredirick Glenys RAMAN, MD  1,000 Units at 02/07/24 9096   collagenase (SANTYL) ointment   Topical Daily Fredirick Glenys RAMAN, MD   Given at 02/07/24 9095   ferrous sulfate tablet 325 mg  325 mg Oral Q breakfast Sigdel, Santosh, MD   325 mg at 02/07/24 9096   folic acid  (FOLVITE ) tablet 1 mg  1 mg Oral Daily Sigdel, Santosh, MD   1 mg at 02/07/24 9097   Gerhardt's butt cream   Topical BID Pratt, Tanya S, MD   Given at 02/07/24 0904   HYDROcodone-acetaminophen  (NORCO/VICODIN) 5-325 MG per tablet 1 tablet  1 tablet Oral Q6H PRN Sigdel, Santosh, MD   1 tablet at 02/05/24 1503   levothyroxine  (SYNTHROID ) tablet 150 mcg  150 mcg Oral Daily Pratt, Tanya  S, MD   150 mcg at 02/07/24 0550   metoprolol tartrate (LOPRESSOR) injection 5 mg  5 mg Intravenous Q5 min PRN Pratt, Tanya S, MD       nystatin  (MYCOSTATIN /NYSTOP ) topical powder   Topical TID Fredirick Glenys RAMAN, MD   Given at 02/07/24 9095   polyethylene glycol (MIRALAX  / GLYCOLAX ) packet 17 g  17 g Oral Daily PRN Pratt, Tanya S, MD       simvastatin  (ZOCOR ) tablet 20 mg  20 mg Oral QHS Pratt, Tanya S, MD   20 mg at 02/06/24 2136   zolpidem  (AMBIEN ) tablet 5 mg  5 mg Oral QHS PRN Pratt, Tanya S, MD   5 mg at 02/05/24 0201     Discharge Medications: Please see discharge summary for a list of discharge medications.  Relevant Imaging Results:  Relevant Lab Results:   Additional Information SSN: [id number]  Hoy DELENA Bigness, LCSW

## 2024-02-07 NOTE — Plan of Care (Signed)
   Problem: Activity: Goal: Risk for activity intolerance will decrease Outcome: Progressing   Problem: Coping: Goal: Level of anxiety will decrease Outcome: Progressing

## 2024-02-07 NOTE — TOC Progression Note (Addendum)
 Transition of Care [facility]) - Progression Note    Patient Details  Name: Angela [name] MRN: [id number] Date of Birth: March 17, 1955  [facility] CM/SW Contact  Hoy DELENA Bigness, LCSW Phone Number: 02/07/2024, 9:56 AM  Clinical Narrative:    Pt/son agreeable to SNF placement. Referrals have been faxed out and currently awaiting bed offers. Auth to be requested.   ADDENDUM: Have reviewed bed offers w/ pt and son. Currently awaiting choice. Son to call once they have discussed/reviewed further.   [facility] [address] [place], [place] [address] [phone] Overall rating ?? [facility] [address] [place], [place] [address] [phone] Overall rating ? [facility] [address] [place], [place] [address] [phone] Overall rating ? [facility] [address] [place], [place] [address] [phone] Overall rating ?     Barriers to Discharge: Continued Medical Work up               Expected Discharge Plan and Services                                               Social Drivers of Health (SDOH) Interventions SDOH Screenings   Food Insecurity: No Food Insecurity (02/05/2024)  Housing: Low Risk  (02/05/2024)  Transportation Needs: No Transportation Needs (02/05/2024)  Utilities: Not At Risk (02/05/2024)  Social Connections: Socially Isolated (02/05/2024)  Tobacco Use: Low Risk  (02/05/2024)    Readmission Risk Interventions    10/15/2023    5:32 PM  Readmission Risk Prevention Plan  Transportation Screening Complete  PCP or Specialist Appt within 5-7 Days Complete  Home Care Screening Complete  Medication Review (RN CM) Complete

## 2024-02-07 NOTE — Progress Notes (Signed)
 PROGRESS NOTE    Angela [name]  FMW:[id number] DOB: 12-12-1955 DOA: 02/04/2024 PCP: Marvine Rush, MD   Brief Narrative:   68 y.o. female with medical history significant of A-fib (on Eliquis ), HTN, HLD, hypothyroid and morbid obesity who presents with increased lower extremity pain and swelling over the last 1 week.   Being treated for LE cellulitis and UTI Pending SNF placement.  Assessment & Plan:  Principal Problem:   Lower extremity cellulitis Active Problems:   Atrial fibrillation (HCC)   Hyperlipidemia   Hypothyroid   Morbid obesity (HCC)   CKD stage 3b, GFR 30-44 ml/min (HCC)   UTI (urinary tract infection)   Normocytic anemia   Bilateral lower extremity edema Bilateral lower extremity cellulitis Bilateral lower extremity venous stasis changes   - Will continue IV cefazolin 2 g Q8H, pain management, continue Tylenol , Norco added. -Anticipate switch to p.o. antibiotics tomorrow - Local wound care - Continue torsemide  10 mg daily   Microscopic hematuria, pyuria concerning for acute uncomplicated UTI secondary to proteus mirabilis:   No fever or chills.  White count mildly elevated Continue antibiotics as above Follow-up on urine cultures-Proteus mirabilis CK mildly elevated, monitor, recheck in the a.m. Continue diuretics as above Kidney function is at baseline creatinine    Hyperlipidemia-hold statin due to mildly elevated CK.     Hypothyroidism-continue levothyroxine    Atrial fibrillation, permanent,POA: Continue Eliquis .  Patient does not appear to be on rate controlling agents  Disposition: Pending SNF.  DVT prophylaxis:  apixaban  (ELIQUIS ) tablet 5 mg     Code Status: Full Code Family Communication:  None at the bedside Status is: Inpatient Remains inpatient appropriate because: pending SNF    Subjective:    Examination:  General exam: Appears calm and comfortable  Respiratory system: Clear to auscultation. Respiratory effort  normal. Cardiovascular system: S1 & S2 heard, RRR. No JVD, murmurs, rubs, gallops or clicks. No pedal edema. Gastrointestinal system: Abdomen is nondistended, soft and nontender. No organomegaly or masses felt. Normal bowel sounds heard. Central nervous system: Alert and oriented. No focal neurological deficits. Extremities: Symmetric 5 x 5 power. Skin: b/l LE erythema and tenderness  Wound 02/04/24 1800 Pressure Injury Buttocks Right;Left Stage 1 -  Intact skin with non-blanchable redness of a localized area usually over a bony prominence. (Active)     Diet Orders (From admission, onward)     Start     Ordered   02/04/24 1809  Diet Heart Room service appropriate? Yes; Fluid consistency: Thin  Diet effective now       Question Answer Comment  Room service appropriate? Yes   Fluid consistency: Thin      02/04/24 1810            Objective: Vitals:   02/06/24 1517 02/06/24 2034 02/07/24 0409 02/07/24 0901  BP: 129/69 139/83 128/85 (!) 144/86  Pulse: 84 77 87 64  Resp:  18 18   Temp: 98.1 F (36.7 C) 99.1 F (37.3 C) 98.4 F (36.9 C)   TempSrc: Oral     SpO2: 98% 94% 92% 95%  Weight:      Height:        Intake/Output Summary (Last 24 hours) at 02/07/2024 1043 Last data filed at 02/07/2024 0500 Gross per 24 hour  Intake 720 ml  Output 2400 ml  Net -1680 ml   Filed Weights   02/05/24 0438  Weight: (!) 168.7 kg    Scheduled Meds:  apixaban   5 mg Oral BID   ascorbic  acid  250 mg Oral Daily   cholecalciferol   1,000 Units Oral Daily   collagenase   Topical Daily   ferrous sulfate  325 mg Oral Q breakfast   folic acid   1 mg Oral Daily   Gerhardt's butt cream   Topical BID   levothyroxine   150 mcg Oral Daily   nystatin    Topical TID   simvastatin   20 mg Oral QHS   Continuous Infusions:   ceFAZolin (ANCEF) IV 2 g (02/07/24 0551)    Nutritional status     Body mass index is 51.88 kg/m.  Data Reviewed:   CBC: Recent Labs  Lab 02/04/24 1423  02/06/24 0418  WBC 11.0* 6.6  NEUTROABS 9.2* 4.1  HGB 10.6* 9.7*  HCT 34.3* 32.2*  MCV 88.2 88.0  PLT 172 178   Basic Metabolic Panel: Recent Labs  Lab 02/04/24 1423 02/06/24 0418 02/07/24 0348  NA 140 144 144  K 4.2 4.1 4.1  CL 108 110 109  CO2 20* 28 29  GLUCOSE 104* 104* 98  BUN 24* 19 15  CREATININE 1.55* 1.37* 1.35*  CALCIUM 8.9 8.7* 8.6*   GFR: Estimated Creatinine Clearance: 70.2 mL/min (A) (by C-G formula based on SCr of 1.35 mg/dL (H)). Liver Function Tests: No results for input(s): AST, ALT, ALKPHOS, BILITOT, PROT, ALBUMIN in the last 168 hours. No results for input(s): LIPASE, AMYLASE in the last 168 hours. No results for input(s): AMMONIA in the last 168 hours. Coagulation Profile: No results for input(s): INR, PROTIME in the last 168 hours. Cardiac Enzymes: Recent Labs  Lab 02/05/24 0842  CKTOTAL 415*   BNP (last 3 results) No results for input(s): PROBNP in the last 8760 hours. HbA1C: No results for input(s): HGBA1C in the last 72 hours. CBG: No results for input(s): GLUCAP in the last 168 hours. Lipid Profile: No results for input(s): CHOL, HDL, LDLCALC, TRIG, CHOLHDL, LDLDIRECT in the last 72 hours. Thyroid  Function Tests: Recent Labs    02/04/24 1424  TSH 2.330   Anemia Panel: Recent Labs    02/04/24 1527 02/04/24 1900  VITAMINB12 395  --   FOLATE 5.8*  --   FERRITIN 181  --   TIBC 245*  --   IRON <10*  --   RETICCTPCT  --  1.1   Sepsis Labs: Recent Labs  Lab 02/04/24 1424 02/04/24 1605  LATICACIDVEN 1.4 1.6    Recent Results (from the past 240 hours)  Blood culture (routine x 2)     Status: None (Preliminary result)   Collection Time: 02/04/24  3:28 PM   Specimen: BLOOD  Result Value Ref Range Status   Specimen Description   Final    BLOOD BLOOD RIGHT ARM Performed at [facility], [address], [place], [place] [address]    Special Requests   Final    NONE Performed at [facility], [address] [place], [place] [address]    Culture  Setup Time   Final    ANAEROBIC BOTTLE ONLY GRAM POSITIVE RODS Gram Stain Report Called to,Read Back By and Verified With: K DILLARD AT 0852 ON 12.02.25 BY ADGER J GRAM STAIN REVIEWED-AGREE WITH RESULT DRT Performed at [facility], [address] [place], [place] [address]    Culture GRAM POSITIVE RODS  Final   Report Status PENDING  Incomplete  Blood culture (routine x 2)     Status: None (Preliminary result)   Collection Time: 02/04/24  3:28 PM   Specimen: BLOOD  Result Value Ref Range Status   Specimen Description BLOOD BLOOD LEFT ARM  Final   Special Requests NONE  Final   Culture   Final    NO GROWTH 3 DAYS Performed at [facility], [address], [place], [place] [address]    Report Status PENDING  Incomplete  Urine Culture     Status: Abnormal   Collection Time: 02/04/24  4:05 PM   Specimen: Urine, Clean Catch  Result Value Ref Range Status   Specimen Description   Final    URINE, CLEAN CATCH Performed at [name], [address] [place], [place] [address]    Special Requests   Final    NONE Performed at [facility], [address] [place], [place] [address]    Culture 20,000 COLONIES/mL PROTEUS MIRABILIS (A)  Final   Report Status 02/07/2024 FINAL  Final   Organism ID, Bacteria PROTEUS MIRABILIS (A)  Final      Susceptibility   Proteus mirabilis - MIC*    AMPICILLIN  <=2 SENSITIVE Sensitive     CEFAZOLIN (URINE) Value in next row Sensitive      4 SENSITIVEThis is a modified FDA-approved test that has been validated and its performance characteristics determined by the reporting laboratory.  This laboratory is certified under the Clinical Laboratory Improvement Amendments CLIA as qualified to perform high complexity clinical laboratory testing.    CEFEPIME  Value in next row Sensitive      4 SENSITIVEThis is a modified FDA-approved test that has been validated and its performance  characteristics determined by the reporting laboratory.  This laboratory is certified under the Clinical Laboratory Improvement Amendments CLIA as qualified to perform high complexity clinical laboratory testing.    ERTAPENEM Value in next row Sensitive      4 SENSITIVEThis is a modified FDA-approved test that has been validated and its performance characteristics determined by the reporting laboratory.  This laboratory is certified under the Clinical Laboratory Improvement Amendments CLIA as qualified to perform high complexity clinical laboratory testing.    CEFTRIAXONE Value in next row Sensitive      4 SENSITIVEThis is a modified FDA-approved test that has been validated and its performance characteristics determined by the reporting laboratory.  This laboratory is certified under the Clinical Laboratory Improvement Amendments CLIA as qualified to perform high complexity clinical laboratory testing.    CIPROFLOXACIN Value in next row Sensitive      4 SENSITIVEThis is a modified FDA-approved test that has been validated and its performance characteristics determined by the reporting laboratory.  This laboratory is certified under the Clinical Laboratory Improvement Amendments CLIA as qualified to perform high complexity clinical laboratory testing.    GENTAMICIN Value in next row Sensitive      4 SENSITIVEThis is a modified FDA-approved test that has been validated and its performance characteristics determined by the reporting laboratory.  This laboratory is certified under the Clinical Laboratory Improvement Amendments CLIA as qualified to perform high complexity clinical laboratory testing.    NITROFURANTOIN Value in next row Resistant      4 SENSITIVEThis is a modified FDA-approved test that has been validated and its performance characteristics determined by the reporting laboratory.  This laboratory is certified under the Clinical Laboratory Improvement Amendments CLIA as qualified to perform high  complexity clinical laboratory testing.    TRIMETH/SULFA Value in next row Sensitive      4 SENSITIVEThis is a modified FDA-approved test that has been validated and its performance characteristics determined by the reporting  laboratory.  This laboratory is certified under the Clinical Laboratory Improvement Amendments CLIA as qualified to perform high complexity clinical laboratory testing.    AMPICILLIN /SULBACTAM Value in next row Sensitive      4 SENSITIVEThis is a modified FDA-approved test that has been validated and its performance characteristics determined by the reporting laboratory.  This laboratory is certified under the Clinical Laboratory Improvement Amendments CLIA as qualified to perform high complexity clinical laboratory testing.    PIP/TAZO Value in next row Sensitive      <=4 SENSITIVEThis is a modified FDA-approved test that has been validated and its performance characteristics determined by the reporting laboratory.  This laboratory is certified under the Clinical Laboratory Improvement Amendments CLIA as qualified to perform high complexity clinical laboratory testing.    MEROPENEM Value in next row Sensitive      <=4 SENSITIVEThis is a modified FDA-approved test that has been validated and its performance characteristics determined by the reporting laboratory.  This laboratory is certified under the Clinical Laboratory Improvement Amendments CLIA as qualified to perform high complexity clinical laboratory testing.    * 20,000 COLONIES/mL PROTEUS MIRABILIS         Radiology Studies: No results found.         LOS: 3 days   Time spent= 35 mins    Deliliah Room, MD [facility]  If 7PM-7AM, please contact night-coverage  02/07/2024, 10:43 AM

## 2024-02-07 NOTE — Plan of Care (Signed)
  Problem: Education: Goal: Knowledge of General Education information will improve Description: Including pain rating scale, medication(s)/side effects and non-pharmacologic comfort measures Outcome: Progressing   Problem: Health Behavior/Discharge Planning: Goal: Ability to manage health-related needs will improve Outcome: Progressing   Problem: Clinical Measurements: Goal: Ability to maintain clinical measurements within normal limits will improve Outcome: Progressing   Problem: Activity: Goal: Risk for activity intolerance will decrease Outcome: Not Met (add Reason)

## 2024-02-08 DIAGNOSIS — L03119 Cellulitis of unspecified part of limb: Secondary | ICD-10-CM | POA: Diagnosis not present

## 2024-02-08 LAB — BASIC METABOLIC PANEL WITH GFR
Anion gap: 10 (ref 5–15)
BUN: 11 mg/dL (ref 8–23)
CO2: 25 mmol/L (ref 22–32)
Calcium: 8.4 mg/dL — ABNORMAL LOW (ref 8.9–10.3)
Chloride: 109 mmol/L (ref 98–111)
Creatinine, Ser: 1.02 mg/dL — ABNORMAL HIGH (ref 0.44–1.00)
GFR, Estimated: >60 mL/min (ref >60)
Glucose, Bld: 95 mg/dL (ref 70–99)
Potassium: 4.3 mmol/L (ref 3.5–5.1)
Sodium: 144 mmol/L (ref 135–145)

## 2024-02-08 MED ORDER — HYDROCODONE-ACETAMINOPHEN 5-325 MG PO TABS: 1.0000 | ORAL_TABLET | Freq: Four times a day (QID) | ORAL | 0 refills | Status: AC | PRN

## 2024-02-08 MED ORDER — FOLIC ACID 1 MG PO TABS: 1.0000 mg | ORAL_TABLET | Freq: Every day | ORAL | Status: AC

## 2024-02-08 MED ORDER — ZOLPIDEM TARTRATE 5 MG PO TABS: 5.0000 mg | ORAL_TABLET | Freq: Every evening | ORAL | 0 refills | Status: AC | PRN

## 2024-02-08 MED ORDER — AMOXICILLIN-POT CLAVULANATE 875-125 MG PO TABS: 1.0000 | ORAL_TABLET | Freq: Two times a day (BID) | ORAL | Status: AC

## 2024-02-08 MED ORDER — GERHARDT'S BUTT CREAM: 1.0000 | TOPICAL_CREAM | Freq: Two times a day (BID) | CUTANEOUS | Status: AC

## 2024-02-08 MED ORDER — NYSTATIN 100000 UNIT/GM EX POWD: Freq: Three times a day (TID) | CUTANEOUS | Status: AC

## 2024-02-08 MED ORDER — COLLAGENASE 250 UNIT/GM EX OINT: TOPICAL_OINTMENT | Freq: Every day | CUTANEOUS | Status: AC

## 2024-02-08 MED ORDER — FERROUS SULFATE 325 (65 FE) MG PO TABS: 325.0000 mg | ORAL_TABLET | Freq: Every day | ORAL | Status: AC

## 2024-02-08 NOTE — Plan of Care (Signed)
  Problem: Education: Goal: Knowledge of General Education information will improve Description: Including pain rating scale, medication(s)/side effects and non-pharmacologic comfort measures Outcome: Progressing   Problem: Health Behavior/Discharge Planning: Goal: Ability to manage health-related needs will improve Outcome: Not Met (add Reason)   Problem: Clinical Measurements: Goal: Ability to maintain clinical measurements within normal limits will improve Outcome: Progressing   Problem: Activity: Goal: Risk for activity intolerance will decrease Outcome: Not Progressing

## 2024-02-08 NOTE — Care Management Important Message (Signed)
 Important Message  Patient Details  Name: Angela [name] MRN: [id number] Date of Birth: 07-May-1955   Important Message Given:  Yes - Medicare IM     Meilyn Heindl L Kendahl Bumgardner 02/08/2024, 2:13 PM

## 2024-02-08 NOTE — Discharge Summary (Addendum)
 Physician Discharge Summary   Patient: Angela [name] MRN: [id number] DOB: 12-19-1955  Admit date:     02/04/2024  Discharge date: 02/08/24  Discharge Physician: Angela [name]   PCP: Angela Rush, MD   Recommendations at discharge:    F/u with your PCP in one week. Continue taking meds as prescribed.  Discharge Diagnoses: Principal Problem:   Lower extremity cellulitis Active Problems:   Atrial fibrillation (HCC)   Hyperlipidemia   Hypothyroid   Morbid obesity (HCC)   CKD stage 3b, GFR 30-44 ml/min (HCC)   UTI (urinary tract infection)   Normocytic anemia   Hospital Course:  68 y.o. female with medical history significant of A-fib (on Eliquis ), HTN, HLD, hypothyroid and morbid obesity who presents with increased lower extremity pain and swelling.  Bilateral lower extremity edema Bilateral lower extremity cellulitis Bilateral lower extremity venous stasis changes   - Received IV cefazolin in the hospital. Dced on oral augmentin. - Local wound care - Continue torsemide  10 mg daily   Microscopic hematuria, pyuria concerning for acute uncomplicated UTI secondary to proteus mirabilis:  No fever or chills.  Continue antibiotics as above Follow-up on urine cultures-Proteus mirabilis CK mildly elevated Continue diuretics as above Kidney function is at baseline creatinine     Hyperlipidemia-Continue with statin on discharge.   Hypothyroidism-continue levothyroxine    Atrial fibrillation, permanent,POA: Continue Eliquis .   Class III Obesity: Likely a candidate for anti-obesity medications or MBS based on her BMI and adiposity related comorbidities.  Disposition: [facility]     Pain control - [place]  Controlled Substance Reporting System database was reviewed. and patient was instructed, not to drive, operate heavy machinery, perform activities at heights, swimming or participation in water activities or provide baby-sitting services while on Pain, Sleep  and Anxiety Medications; until their outpatient Physician has advised to do so again. Also recommended to not to take more than prescribed Pain, Sleep and Anxiety Medications.  Consultants: None Procedures performed: None  Disposition: Skilled nursing facility Diet recommendation:  Cardiac diet DISCHARGE MEDICATION: Allergies as of 02/08/2024       Reactions   Ibuprofen Other (See Comments)   Physician states it will affect her kidneys        Medication List     TAKE these medications    acetaminophen  500 MG tablet Commonly known as: TYLENOL  Take 1,000 mg by mouth every 6 (six) hours as needed for headache.   amoxicillin-clavulanate 875-125 MG tablet Commonly known as: AUGMENTIN Take 1 tablet by mouth 2 (two) times daily for 3 days.   celecoxib 200 MG capsule Commonly known as: CELEBREX Take 200 mg by mouth 2 (two) times daily.   cholecalciferol  25 MCG (1000 UNIT) tablet Commonly known as: VITAMIN D3 Take 1,000 Units by mouth daily.   collagenase 250 UNIT/GM ointment Commonly known as: SANTYL Apply topically daily. Start taking on: February 09, 2024   Eliquis  5 MG Tabs tablet Generic drug: apixaban  TAKE 1 TABLET BY MOUTH TWICE  DAILY   ferrous sulfate 325 (65 FE) MG tablet Take 1 tablet (325 mg total) by mouth daily with breakfast. Start taking on: February 09, 2024   FLAX SEED OIL PO Take 1 capsule by mouth daily.   folic acid  1 MG tablet Commonly known as: FOLVITE  Take 1 tablet (1 mg total) by mouth daily. Start taking on: February 09, 2024   Gerhardt's butt cream Crea Apply 1 Application topically 2 (two) times daily.   HYDROcodone-acetaminophen  5-325 MG tablet Commonly known as: NORCO/VICODIN  Take 1 tablet by mouth every 6 (six) hours as needed for severe pain (pain score 7-10).   levothyroxine  150 MCG tablet Commonly known as: SYNTHROID  Take 150 mcg by mouth daily.   nystatin  powder Commonly known as: MYCOSTATIN /NYSTOP  Apply topically 3 (three)  times daily.   simvastatin  20 MG tablet Commonly known as: ZOCOR  Take 20 mg by mouth at bedtime.   torsemide  10 MG tablet Commonly known as: DEMADEX  Take 1 tablet (10 mg total) by mouth daily.   vitamin C 100 MG tablet Take 100 mg by mouth daily.   zolpidem  5 MG tablet Commonly known as: AMBIEN  Take 1 tablet (5 mg total) by mouth at bedtime as needed for sleep.        Contact information for follow-up providers     Angela Rush, MD. Schedule an appointment as soon as possible for a visit in 1 week(s).   Specialty: Family Medicine Contact information: [address] [place] [address] [phone]              Contact information for after-discharge care     Destination     [facility] .   Service: Skilled Nursing Contact information: [address] [place]  [address] [phone]                    Discharge Exam: Angela [name]   02/05/24 0438  Weight: (!) 168.7 kg   General exam: Appears calm and comfortable  Respiratory system: Clear to auscultation. Respiratory effort normal. Cardiovascular system: S1 & S2 heard, RRR. No JVD, murmurs, rubs, gallops or clicks. No pedal edema. Gastrointestinal system: Abdomen is nondistended, soft and nontender. No organomegaly or masses felt. Normal bowel sounds heard. Central nervous system: Alert and oriented. No focal neurological deficits. Extremities: Symmetric 5 x 5 power. Skin: b/l LE erythema and tenderness  Condition at discharge: good  The results of significant diagnostics from this hospitalization (including imaging, microbiology, ancillary and laboratory) are listed below for reference.   Imaging Studies: No results found.  Microbiology: Results for orders placed or performed during the hospital encounter of 02/04/24  Blood culture (routine x 2)     Status: None (Preliminary result)   Collection Time: 02/04/24  3:28 PM   Specimen: BLOOD  Result Value Ref  Range Status   Specimen Description   Final    BLOOD BLOOD RIGHT ARM Performed at [facility], [address], [place], [place] [address]    Special Requests   Final    NONE Performed at [facility], [address] [place], [place] [address]    Culture  Setup Time   Final    ANAEROBIC BOTTLE ONLY GRAM POSITIVE RODS Gram Stain Report Called to,Read Back By and Verified With: K DILLARD AT 0852 ON 12.02.25 BY ADGER J GRAM STAIN REVIEWED-AGREE WITH RESULT DRT    Culture   Final    GRAM POSITIVE RODS IDENTIFICATION TO FOLLOW Performed at [facility], [address] [place], [place] [address]    Report Status PENDING  Incomplete  Blood culture (routine x 2)     Status: None (Preliminary result)   Collection Time: 02/04/24  3:28 PM   Specimen: BLOOD  Result Value Ref Range Status   Specimen Description BLOOD BLOOD LEFT ARM  Final   Special Requests NONE  Final   Culture   Final    NO GROWTH 4 DAYS Performed at [facility], [address] [place], [place] [address]  Report Status PENDING  Incomplete  Urine Culture     Status: Abnormal   Collection Time: 02/04/24  4:05 PM   Specimen: Urine, Clean Catch  Result Value Ref Range Status   Specimen Description   Final    URINE, CLEAN CATCH Performed at [facility], [address], [place], [place] [address]    Special Requests   Final    NONE Performed at [facility], [address] [place], [place] [address]    Culture 20,000 COLONIES/mL PROTEUS MIRABILIS (A)  Final   Report Status 02/07/2024 FINAL  Final   Organism ID, Bacteria PROTEUS MIRABILIS (A)  Final      Susceptibility   Proteus mirabilis - MIC*    AMPICILLIN  <=2 SENSITIVE Sensitive     CEFAZOLIN  (URINE) Value in next row Sensitive      4 SENSITIVEThis is a modified FDA-approved test that has been validated and its performance characteristics determined by the reporting laboratory.  This laboratory is certified under the Clinical Laboratory Improvement  Amendments CLIA as qualified to perform high complexity clinical laboratory testing.    CEFEPIME  Value in next row Sensitive      4 SENSITIVEThis is a modified FDA-approved test that has been validated and its performance characteristics determined by the reporting laboratory.  This laboratory is certified under the Clinical Laboratory Improvement Amendments CLIA as qualified to perform high complexity clinical laboratory testing.    ERTAPENEM Value in next row Sensitive      4 SENSITIVEThis is a modified FDA-approved test that has been validated and its performance characteristics determined by the reporting laboratory.  This laboratory is certified under the Clinical Laboratory Improvement Amendments CLIA as qualified to perform high complexity clinical laboratory testing.    CEFTRIAXONE Value in next row Sensitive      4 SENSITIVEThis is a modified FDA-approved test that has been validated and its performance characteristics determined by the reporting laboratory.  This laboratory is certified under the Clinical Laboratory Improvement Amendments CLIA as qualified to perform high complexity clinical laboratory testing.    CIPROFLOXACIN Value in next row Sensitive      4 SENSITIVEThis is a modified FDA-approved test that has been validated and its performance characteristics determined by the reporting laboratory.  This laboratory is certified under the Clinical Laboratory Improvement Amendments CLIA as qualified to perform high complexity clinical laboratory testing.    GENTAMICIN Value in next row Sensitive      4 SENSITIVEThis is a modified FDA-approved test that has been validated and its performance characteristics determined by the reporting laboratory.  This laboratory is certified under the Clinical Laboratory Improvement Amendments CLIA as qualified to perform high complexity clinical laboratory testing.    NITROFURANTOIN Value in next row Resistant      4 SENSITIVEThis is a modified  FDA-approved test that has been validated and its performance characteristics determined by the reporting laboratory.  This laboratory is certified under the Clinical Laboratory Improvement Amendments CLIA as qualified to perform high complexity clinical laboratory testing.    TRIMETH/SULFA Value in next row Sensitive      4 SENSITIVEThis is a modified FDA-approved test that has been validated and its performance characteristics determined by the reporting laboratory.  This laboratory is certified under the Clinical Laboratory Improvement Amendments CLIA as qualified to perform high complexity clinical laboratory testing.    AMPICILLIN /SULBACTAM Value in next row Sensitive      4 SENSITIVEThis is a modified FDA-approved test that has been validated and its  performance characteristics determined by the reporting laboratory.  This laboratory is certified under the Clinical Laboratory Improvement Amendments CLIA as qualified to perform high complexity clinical laboratory testing.    PIP/TAZO Value in next row Sensitive      <=4 SENSITIVEThis is a modified FDA-approved test that has been validated and its performance characteristics determined by the reporting laboratory.  This laboratory is certified under the Clinical Laboratory Improvement Amendments CLIA as qualified to perform high complexity clinical laboratory testing.    MEROPENEM Value in next row Sensitive      <=4 SENSITIVEThis is a modified FDA-approved test that has been validated and its performance characteristics determined by the reporting laboratory.  This laboratory is certified under the Clinical Laboratory Improvement Amendments CLIA as qualified to perform high complexity clinical laboratory testing.    * 20,000 COLONIES/mL PROTEUS MIRABILIS    Labs: CBC: Recent Labs  Lab 02/04/24 1423 02/06/24 0418  WBC 11.0* 6.6  NEUTROABS 9.2* 4.1  HGB 10.6* 9.7*  HCT 34.3* 32.2*  MCV 88.2 88.0  PLT 172 178   Basic Metabolic  Panel: Recent Labs  Lab 02/04/24 1423 02/06/24 0418 02/07/24 0348 02/08/24 0434  NA 140 144 144 144  K 4.2 4.1 4.1 4.3  CL 108 110 109 109  CO2 20* 28 29 25   GLUCOSE 104* 104* 98 95  BUN 24* 19 15 11   CREATININE 1.55* 1.37* 1.35* 1.02*  CALCIUM 8.9 8.7* 8.6* 8.4*   Liver Function Tests: No results for input(s): AST, ALT, ALKPHOS, BILITOT, PROT, ALBUMIN in the last 168 hours. CBG: No results for input(s): GLUCAP in the last 168 hours.  Discharge time spent: 40 minutes.  Signed: Deliliah Room, MD [facility] 02/08/2024

## 2024-02-08 NOTE — TOC Transition Note (Signed)
 Transition of Care [facility]) - Discharge Note   Patient Details  Name: Angela [name] MRN: [id number] Date of Birth: 1956-01-11  [facility]) CM/SW Contact:  Hoy DELENA Bigness, LCSW Phone Number: 02/08/2024, 1:54 PM   Clinical Narrative:    [facility] approved for SNF. Pt will be going to [facility]. RN to call report to [phone]. Med necessity form printed to the unit. Pt to be transported via EMS.   Final next level of care: Skilled Nursing Facility Barriers to Discharge: Barriers Resolved   Patient Goals and CMS Choice Patient states their goals for this hospitalization and ongoing recovery are:: To go to rehab CMS Medicare.gov Compare Post Acute Care list provided to:: Patient Choice offered to / list presented to : Patient [place] ownership interest in [facility].provided to:: Patient    Discharge Placement   Existing PASRR number confirmed : 02/07/24          Patient chooses bed at: Other - please specify in the comment section below: [facility]) Patient to be transferred to facility by: [facility] Name of family member notified: pt Patient and family notified of of transfer: 02/08/24  Discharge Plan and Services Additional resources added to the After Visit Summary for                  DME Arranged: N/A DME Agency: NA                  Social Drivers of Health (SDOH) Interventions SDOH Screenings   Food Insecurity: No Food Insecurity (02/05/2024)  Housing: Low Risk  (02/05/2024)  Transportation Needs: No Transportation Needs (02/05/2024)  Utilities: Not At Risk (02/05/2024)  Social Connections: Socially Isolated (02/05/2024)  Tobacco Use: Low Risk  (02/05/2024)     Readmission Risk Interventions    02/08/2024    1:53 PM 10/15/2023    5:32 PM  Readmission Risk Prevention Plan  Post Dischage Appt Complete   Medication Screening Complete   Transportation Screening Complete Complete  PCP or Specialist Appt within 5-7  Days  Complete  Home Care Screening  Complete  Medication Review (RN CM)  Complete

## 2024-02-08 NOTE — TOC Progression Note (Signed)
 Transition of Care [facility]) - Progression Note    Patient Details  Name: Angela [name] MRN: [id number] Date of Birth: 27-Jun-1955  [facility]) CM/SW Contact  Hoy DELENA Bigness, LCSW Phone Number: 02/08/2024, 9:10 AM  Clinical Narrative:    Pt/son accepted SNF bed offer for [facility]. Auth updated and currently pending.     Barriers to Discharge: Continued Medical Work up               Expected Discharge Plan and Services                                               Social Drivers of Health (SDOH) Interventions SDOH Screenings   Food Insecurity: No Food Insecurity (02/05/2024)  Housing: Low Risk  (02/05/2024)  Transportation Needs: No Transportation Needs (02/05/2024)  Utilities: Not At Risk (02/05/2024)  Social Connections: Socially Isolated (02/05/2024)  Tobacco Use: Low Risk  (02/05/2024)    Readmission Risk Interventions    10/15/2023    5:32 PM  Readmission Risk Prevention Plan  Transportation Screening Complete  PCP or Specialist Appt within 5-7 Days Complete  Home Care Screening Complete  Medication Review (RN CM) Complete

## 2024-02-09 NOTE — Progress Notes (Signed)
 Lab called today 02/09/24 at 1625 reporting a critical result of a anaerobic blood culture positive for gram positive rods. Dr. Dino made aware.

## 2024-02-10 LAB — CULTURE, BLOOD (ROUTINE X 2): Culture  Setup Time: NO GROWTH

## 2024-02-11 LAB — CULTURE, BLOOD (ROUTINE X 2): Culture  Setup Time: NO GROWTH

## 2024-02-15 ENCOUNTER — Telehealth: Payer: Self-pay | Admitting: Cardiology

## 2024-02-15 NOTE — Telephone Encounter (Signed)
 Caller Bert) returned staff call.

## 2024-02-15 NOTE — Telephone Encounter (Signed)
 Spoke to [place] in regards to critical chronic form- stated that form was faxed to [employer]. Will await for form to be sent to [facility] office. Pt needs form completed by Jan. 1, 2026 to continue benefits.

## 2024-02-15 NOTE — Telephone Encounter (Signed)
 [facility] called to f/u on critical chronic form. Please advise

## 2024-02-15 NOTE — Telephone Encounter (Signed)
 Left message for [facility] to call office back regarding critical chronic form.

## 2024-02-18 ENCOUNTER — Other Ambulatory Visit: Payer: Self-pay | Admitting: Cardiology

## 2024-02-19 NOTE — Telephone Encounter (Signed)
 Prescription refill request for Eliquis  received. Indication:afib Last office visit:4/25 Scr: 1.02  12/25 Age:68 Weight:168.7  kg  Prescription refilled

## 2024-03-11 ENCOUNTER — Telehealth: Payer: Self-pay | Admitting: Cardiology

## 2024-03-11 NOTE — Telephone Encounter (Signed)
 We have not received any forms from Medicaid . Patient will provide her insurance our direct fax number [phone]

## 2024-03-11 NOTE — Telephone Encounter (Signed)
 PT calling to state Medicaid faxed a form that needs to be signed for her to receive benefits. She ask it be signed and returned. Office can contact  [phone] for more info. Please advise.

## 2024-03-12 NOTE — Telephone Encounter (Signed)
 Pt states insurance already faxed papers to [phone]. Let her know to ask them to resend.

## 2024-03-20 ENCOUNTER — Ambulatory Visit: Admitting: Physician Assistant

## 2024-04-08 ENCOUNTER — Ambulatory Visit: Admitting: Student in an Organized Health Care Education/Training Program

## 2024-04-11 ENCOUNTER — Ambulatory Visit: Payer: Self-pay | Admitting: Cardiology

## 2024-04-12 ENCOUNTER — Encounter (HOSPITAL_COMMUNITY): Payer: Self-pay

## 2024-04-12 ENCOUNTER — Other Ambulatory Visit: Payer: Self-pay

## 2024-04-12 ENCOUNTER — Emergency Department (HOSPITAL_COMMUNITY): Admission: EM | Admit: 2024-04-12 | Source: Home / Self Care

## 2024-04-12 LAB — CBC WITH DIFFERENTIAL/PLATELET
Abs Immature Granulocytes: 0.05 10*3/uL (ref 0.00–0.07)
Basophils Absolute: 0 10*3/uL (ref 0.0–0.1)
Basophils Relative: 0 %
Eosinophils Absolute: 0.1 10*3/uL (ref 0.0–0.5)
Eosinophils Relative: 1 %
HCT: 33.6 % — ABNORMAL LOW (ref 36.0–46.0)
Hemoglobin: 10.1 g/dL — ABNORMAL LOW (ref 12.0–15.0)
Immature Granulocytes: 0 %
Lymphocytes Relative: 14 %
Lymphs Abs: 1.7 10*3/uL (ref 0.7–4.0)
MCH: 26 pg (ref 26.0–34.0)
MCHC: 30.1 g/dL (ref 30.0–36.0)
MCV: 86.6 fL (ref 80.0–100.0)
Monocytes Absolute: 0.9 10*3/uL (ref 0.1–1.0)
Monocytes Relative: 8 %
Neutro Abs: 8.7 10*3/uL — ABNORMAL HIGH (ref 1.7–7.7)
Neutrophils Relative %: 77 %
Platelets: 235 10*3/uL (ref 150–400)
RBC: 3.88 MIL/uL (ref 3.87–5.11)
RDW: 15.2 % (ref 11.5–15.5)
WBC: 11.4 10*3/uL — ABNORMAL HIGH (ref 4.0–10.5)
nRBC: 0 % (ref 0.0–0.2)

## 2024-04-12 LAB — COMPREHENSIVE METABOLIC PANEL WITH GFR
ALT: <5 U/L (ref 0–44)
AST: 14 U/L — ABNORMAL LOW (ref 15–41)
Albumin: 3.3 g/dL — ABNORMAL LOW (ref 3.5–5.0)
Alkaline Phosphatase: 68 U/L (ref 38–126)
Anion gap: 10 (ref 5–15)
BUN: 21 mg/dL (ref 8–23)
CO2: 30 mmol/L (ref 22–32)
Calcium: 9 mg/dL (ref 8.9–10.3)
Chloride: 107 mmol/L (ref 98–111)
Creatinine, Ser: 1.18 mg/dL — ABNORMAL HIGH (ref 0.44–1.00)
GFR, Estimated: 50 mL/min — ABNORMAL LOW
Glucose, Bld: 97 mg/dL (ref 70–99)
Potassium: 4 mmol/L (ref 3.5–5.1)
Sodium: 147 mmol/L — ABNORMAL HIGH (ref 135–145)
Total Bilirubin: 0.6 mg/dL (ref 0.0–1.2)
Total Protein: 5.9 g/dL — ABNORMAL LOW (ref 6.5–8.1)

## 2024-04-12 LAB — TYPE AND SCREEN
ABO/RH(D): O POS
Antibody Screen: NEGATIVE

## 2024-04-12 LAB — LACTIC ACID, PLASMA: Lactic Acid, Venous: 1.1 mmol/L (ref 0.5–1.9)

## 2024-04-12 NOTE — ED Triage Notes (Signed)
 Pt arrived via [facility] from home after Pt called reporting bleeding from presents to be a blood blister that spontaneously ruptured on RLE. Pt is on Eliquis , with recent admission and rehab for cellulitis and bleeding is not controlled at this time.

## 2024-04-12 NOTE — ED Notes (Addendum)
 Critical value documented in error.

## 2024-04-16 ENCOUNTER — Encounter: Admitting: Pulmonary Disease

## 2024-04-22 ENCOUNTER — Ambulatory Visit: Admitting: Student in an Organized Health Care Education/Training Program
